# Patient Record
Sex: Female | Born: 1940
Health system: Southern US, Community
[De-identification: ages and names within clinical notes are randomized; demographics above are authoritative.]

## PROBLEM LIST (undated history)

## (undated) DIAGNOSIS — F32A Depression, unspecified: Secondary | ICD-10-CM

## (undated) DIAGNOSIS — I10 Essential (primary) hypertension: Secondary | ICD-10-CM

## (undated) DIAGNOSIS — F329 Major depressive disorder, single episode, unspecified: Secondary | ICD-10-CM

## (undated) DIAGNOSIS — E785 Hyperlipidemia, unspecified: Secondary | ICD-10-CM

## (undated) DIAGNOSIS — T7840XA Allergy, unspecified, initial encounter: Secondary | ICD-10-CM

## (undated) DIAGNOSIS — C50911 Malignant neoplasm of unspecified site of right female breast: Secondary | ICD-10-CM

## (undated) DIAGNOSIS — E559 Vitamin D deficiency, unspecified: Secondary | ICD-10-CM

## (undated) HISTORY — PX: BREAST LUMPECTOMY: SHX2

## (undated) HISTORY — DX: Major depressive disorder, single episode, unspecified: F32.9

## (undated) HISTORY — PX: TONSILLECTOMY AND ADENOIDECTOMY: SUR1326

## (undated) HISTORY — PX: ABDOMINAL HYSTERECTOMY: SHX81

## (undated) HISTORY — DX: Allergy, unspecified, initial encounter: T78.40XA

## (undated) HISTORY — DX: Hyperlipidemia, unspecified: E78.5

## (undated) HISTORY — DX: Essential (primary) hypertension: I10

## (undated) HISTORY — DX: Malignant neoplasm of unspecified site of right female breast: C50.911

## (undated) HISTORY — DX: Depression, unspecified: F32.A

## (undated) HISTORY — DX: Vitamin D deficiency, unspecified: E55.9

---

## 1990-06-28 DIAGNOSIS — C50911 Malignant neoplasm of unspecified site of right female breast: Secondary | ICD-10-CM

## 1990-06-28 HISTORY — DX: Malignant neoplasm of unspecified site of right female breast: C50.911

## 1998-03-05 ENCOUNTER — Other Ambulatory Visit: Admission: RE | Admit: 1998-03-05 | Discharge: 1998-03-05 | Payer: Self-pay | Admitting: Oncology

## 1999-03-06 ENCOUNTER — Other Ambulatory Visit: Admission: RE | Admit: 1999-03-06 | Discharge: 1999-03-06 | Payer: Self-pay | Admitting: Oncology

## 1999-07-17 ENCOUNTER — Encounter: Admission: RE | Admit: 1999-07-17 | Discharge: 1999-07-17 | Payer: Self-pay | Admitting: Oncology

## 1999-07-17 ENCOUNTER — Encounter: Payer: Self-pay | Admitting: Oncology

## 2000-04-22 ENCOUNTER — Encounter: Admission: RE | Admit: 2000-04-22 | Discharge: 2000-04-22 | Payer: Self-pay | Admitting: Family Medicine

## 2000-04-22 ENCOUNTER — Encounter: Payer: Self-pay | Admitting: Family Medicine

## 2000-08-01 ENCOUNTER — Encounter: Payer: Self-pay | Admitting: Oncology

## 2000-08-01 ENCOUNTER — Encounter: Admission: RE | Admit: 2000-08-01 | Discharge: 2000-08-01 | Payer: Self-pay | Admitting: Oncology

## 2001-07-13 ENCOUNTER — Other Ambulatory Visit: Admission: RE | Admit: 2001-07-13 | Discharge: 2001-07-13 | Payer: Self-pay | Admitting: *Deleted

## 2001-08-03 ENCOUNTER — Encounter: Payer: Self-pay | Admitting: Oncology

## 2001-08-03 ENCOUNTER — Encounter: Admission: RE | Admit: 2001-08-03 | Discharge: 2001-08-03 | Payer: Self-pay | Admitting: Oncology

## 2002-06-11 ENCOUNTER — Encounter: Admission: RE | Admit: 2002-06-11 | Discharge: 2002-06-11 | Payer: Self-pay | Admitting: Family Medicine

## 2002-06-11 ENCOUNTER — Emergency Department (HOSPITAL_COMMUNITY): Admission: EM | Admit: 2002-06-11 | Discharge: 2002-06-12 | Payer: Self-pay | Admitting: Emergency Medicine

## 2002-06-11 ENCOUNTER — Encounter: Payer: Self-pay | Admitting: Family Medicine

## 2002-08-14 ENCOUNTER — Encounter: Admission: RE | Admit: 2002-08-14 | Discharge: 2002-08-14 | Payer: Self-pay | Admitting: Oncology

## 2002-08-14 ENCOUNTER — Encounter: Payer: Self-pay | Admitting: Oncology

## 2003-10-17 ENCOUNTER — Encounter: Admission: RE | Admit: 2003-10-17 | Discharge: 2003-10-17 | Payer: Self-pay | Admitting: Oncology

## 2004-11-12 ENCOUNTER — Encounter: Admission: RE | Admit: 2004-11-12 | Discharge: 2004-11-12 | Payer: Self-pay | Admitting: Internal Medicine

## 2005-11-15 ENCOUNTER — Encounter: Admission: RE | Admit: 2005-11-15 | Discharge: 2005-11-15 | Payer: Self-pay | Admitting: Internal Medicine

## 2006-04-25 ENCOUNTER — Other Ambulatory Visit: Admission: RE | Admit: 2006-04-25 | Discharge: 2006-04-25 | Payer: Self-pay | Admitting: Internal Medicine

## 2007-06-02 ENCOUNTER — Encounter: Admission: RE | Admit: 2007-06-02 | Discharge: 2007-06-02 | Payer: Self-pay | Admitting: Internal Medicine

## 2008-06-05 ENCOUNTER — Other Ambulatory Visit: Admission: RE | Admit: 2008-06-05 | Discharge: 2008-06-05 | Payer: Self-pay | Admitting: Internal Medicine

## 2008-06-11 ENCOUNTER — Encounter: Admission: RE | Admit: 2008-06-11 | Discharge: 2008-06-11 | Payer: Self-pay | Admitting: Internal Medicine

## 2008-07-01 ENCOUNTER — Ambulatory Visit: Payer: Self-pay | Admitting: Internal Medicine

## 2008-07-11 ENCOUNTER — Ambulatory Visit: Payer: Self-pay | Admitting: Internal Medicine

## 2008-07-11 ENCOUNTER — Encounter: Payer: Self-pay | Admitting: Internal Medicine

## 2008-07-16 ENCOUNTER — Encounter: Payer: Self-pay | Admitting: Internal Medicine

## 2010-06-03 ENCOUNTER — Encounter
Admission: RE | Admit: 2010-06-03 | Discharge: 2010-06-03 | Payer: Self-pay | Source: Home / Self Care | Admitting: Internal Medicine

## 2010-07-30 NOTE — Procedures (Signed)
Summary: Colonoscopy   Colonoscopy  Procedure date:  07/11/2008  Findings:      Location:  [facility].    Procedures Next Due Date:    Colonoscopy: 06/2013  COLONOSCOPY PROCEDURE REPORT  PATIENT:  Tiffany Vaughn, Tiffany Vaughn  MR#:  [id number] BIRTHDATE:   Oct 04, 1940   GENDER:   female  ENDOSCOPIST:   Wilhemina Bonito. Eda Keys, MD Referred by: Marisue Brooklyn, D.O.  PROCEDURE DATE:  07/11/2008 PROCEDURE:  Colonoscopy with snare polypectomy ASA CLASS:   Class II INDICATIONS: colorectal cancer screening, average risk   MEDICATIONS:    Versed 8 mg IV, Fentanyl 75 mcg IV  DESCRIPTION OF PROCEDURE:   After the risks benefits and alternatives of the procedure were thoroughly explained, informed consent was obtained.  Digital rectal exam was performed and revealed no abnormalities.   The LB CF-[id number] endoscope was introduced through the anus and advanced to the cecum, which was identified by both the appendix and ileocecal valve, without limitations. TIME TO CECUM = 4:07 MIN. The quality of the prep was good, using MoviPrep.  The instrument was then slowly withdrawn (TIME = 14:25 MIN) as the colon was fully examined. <<PROCEDUREIMAGES>>        <<OLD IMAGES>>  FINDINGS:  A 8mm pedunculated polyp was found in the ascending colon. Polyp was snared without cautery. Retrieval was successful.  This was otherwise a normal examination.   Retroflexed views in the rectum revealed no abnormalities.    The scope was then withdrawn from the patient and the procedure completed.  COMPLICATIONS:   None  July 16, 2008 MRN: [id number]  REPORT OF SURGICAL PATHOLOGY   Case #: [id number] Patient Name: Tiffany Vaughn, Tiffany Vaughn Office Chart Number:  [id number]   MRN: [id number] Pathologist: Beulah Gandy. Luisa Hart, MD DOB/Age  [birth year] (Age: 70)    Gender: F Date Taken:  07/11/2008 Date Received: 07/12/2008   FINAL DIAGNOSIS   ***MICROSCOPIC EXAMINATION AND DIAGNOSIS***   COLON, ASCENDING POLYP:  TUBULAR  ADENOMA.  NO HIGH GRADE DYSPLASIA OR MALIGNANCY IDENTIFIED.   kv Date Reported:  07/15/2008     Beulah Gandy. Luisa Hart, MD    [facility] [address] [place], [place]  [address]    Dear Ms. Aziz,  I am pleased to inform you that the colon polyp(s) removed during your recent colonoscopy was (were) found to be benign (no cancer detected) upon pathologic examination.  I recommend you have a repeat colonoscopy examination in 5 years to look for recurrent polyps, as having colon polyps increases your risk for having recurrent polyps or even colon cancer in the future.  Should you develop new or worsening symptoms of abdominal pain, bowel habit changes or bleeding from the rectum or bowels, please schedule an evaluation with either your primary care physician or with me.  Additional information/recommendations:  _X._ No further action with gastroenterology is needed at this time. Please      follow-up with your primary care physician for your other healthcare      needs.  Please call us if you are having persistent problems or have questions about your condition that have not been fully answered at this time.  Sincerely,  Hilarie Fredrickson MD  ENDOSCOPIC IMPRESSION:  1) Pedunculated polyp in the ascending colon - removed  2) Otherwise normal examination  RECOMMENDATIONS:  1) Repeat colonoscopy in 5 years if polyp adenomatous; otherwise 10 years    _______________________________ Wilhemina Bonito. Eda Keys, MD  CC: Linton Rump Elisabeth Most DO;  The Patient  This report was created from the original endoscopy report, which was reviewed and signed by the above listed endoscopist.

## 2010-07-30 NOTE — Letter (Signed)
Summary: Patient Notice- Polyp Results  [facility]  [address] [place], [place] [address]   Phone: [phone]  Fax: [phone]        July 16, 2008 MRN: [id number]    [facility] [address] [place], [place]  [address]    Dear Ms. Klare,  I am pleased to inform you that the colon polyp(s) removed during your recent colonoscopy was (were) found to be benign (no cancer detected) upon pathologic examination.  I recommend you have a repeat colonoscopy examination in 5 years to look for recurrent polyps, as having colon polyps increases your risk for having recurrent polyps or even colon cancer in the future.  Should you develop new or worsening symptoms of abdominal pain, bowel habit changes or bleeding from the rectum or bowels, please schedule an evaluation with either your primary care physician or with me.  Additional information/recommendations:  _X._ No further action with gastroenterology is needed at this time. Please      follow-up with your primary care physician for your other healthcare      needs.  Please call us if you are having persistent problems or have questions about your condition that have not been fully answered at this time.  Sincerely,  Hilarie Fredrickson MD  This letter has been electronically signed by your physician.

## 2010-07-30 NOTE — Miscellaneous (Signed)
Summary: [facility] pre-visit  Clinical Lists Changes  Medications: Added new medication of MOVIPREP 100 GM  SOLR (PEG-KCL-NACL-NASULF-NA ASC-C) As per prep instructions. - Signed Rx of MOVIPREP 100 GM  SOLR (PEG-KCL-NACL-NASULF-NA ASC-C) As per prep instructions.;  #1 x 0;  Signed;  Entered by: Quincy Carnes RN;  Authorized by: Hilarie Fredrickson MD;  Method used: Electronically to [facility] # [phone]*, [address] [place], [place]  [address], Ph: [phone], Fax: [phone]    Prescriptions: MOVIPREP 100 GM  SOLR (PEG-KCL-NACL-NASULF-NA ASC-C) As per prep instructions.  #1 x 0   Entered by:   Quincy Carnes RN   Authorized by:   Hilarie Fredrickson MD   Signed by:   Quincy Carnes RN on 07/01/2008   Method used:   Electronically to        [facility] # [phone]* (retail)       [address]       [place], [place]  [address]       Ph: [phone]       Fax: [phone]   RxID:   [id number]

## 2010-11-24 ENCOUNTER — Other Ambulatory Visit: Payer: Self-pay | Admitting: Internal Medicine

## 2010-11-24 DIAGNOSIS — Z780 Asymptomatic menopausal state: Secondary | ICD-10-CM

## 2010-12-02 ENCOUNTER — Other Ambulatory Visit: Payer: Self-pay

## 2011-06-15 ENCOUNTER — Other Ambulatory Visit: Payer: Self-pay | Admitting: Internal Medicine

## 2011-06-15 DIAGNOSIS — Z1231 Encounter for screening mammogram for malignant neoplasm of breast: Secondary | ICD-10-CM

## 2011-07-27 ENCOUNTER — Ambulatory Visit
Admission: RE | Admit: 2011-07-27 | Discharge: 2011-07-27 | Disposition: A | Discharge status: Home / Self Care | Payer: Medicare Other | Source: Ambulatory Visit | Attending: Internal Medicine | Admitting: Internal Medicine

## 2011-07-27 DIAGNOSIS — Z1231 Encounter for screening mammogram for malignant neoplasm of breast: Secondary | ICD-10-CM

## 2011-12-08 ENCOUNTER — Other Ambulatory Visit: Payer: Self-pay | Admitting: Internal Medicine

## 2011-12-08 DIAGNOSIS — Z853 Personal history of malignant neoplasm of breast: Secondary | ICD-10-CM

## 2011-12-08 DIAGNOSIS — Z780 Asymptomatic menopausal state: Secondary | ICD-10-CM

## 2011-12-08 DIAGNOSIS — Z1231 Encounter for screening mammogram for malignant neoplasm of breast: Secondary | ICD-10-CM

## 2011-12-08 DIAGNOSIS — M8580 Other specified disorders of bone density and structure, unspecified site: Secondary | ICD-10-CM

## 2011-12-08 DIAGNOSIS — N63 Unspecified lump in unspecified breast: Secondary | ICD-10-CM

## 2011-12-14 ENCOUNTER — Ambulatory Visit
Admission: RE | Admit: 2011-12-14 | Discharge: 2011-12-14 | Disposition: A | Discharge status: Home / Self Care | Payer: Medicare Other | Source: Ambulatory Visit | Attending: Internal Medicine | Admitting: Internal Medicine

## 2011-12-14 DIAGNOSIS — Z853 Personal history of malignant neoplasm of breast: Secondary | ICD-10-CM

## 2011-12-14 DIAGNOSIS — N63 Unspecified lump in unspecified breast: Secondary | ICD-10-CM

## 2011-12-20 ENCOUNTER — Other Ambulatory Visit: Payer: Medicare Other

## 2012-12-07 ENCOUNTER — Other Ambulatory Visit: Payer: Self-pay | Admitting: Internal Medicine

## 2012-12-07 DIAGNOSIS — Z853 Personal history of malignant neoplasm of breast: Secondary | ICD-10-CM

## 2012-12-07 DIAGNOSIS — Z1231 Encounter for screening mammogram for malignant neoplasm of breast: Secondary | ICD-10-CM

## 2012-12-07 DIAGNOSIS — Z9889 Other specified postprocedural states: Secondary | ICD-10-CM

## 2013-01-19 ENCOUNTER — Ambulatory Visit: Payer: Medicare Other

## 2013-01-19 ENCOUNTER — Other Ambulatory Visit: Payer: Medicare Other

## 2013-07-11 ENCOUNTER — Encounter: Payer: Self-pay | Admitting: Internal Medicine

## 2013-07-25 ENCOUNTER — Ambulatory Visit
Admission: RE | Admit: 2013-07-25 | Discharge: 2013-07-25 | Disposition: A | Discharge status: Home / Self Care | Payer: Medicare HMO | Source: Ambulatory Visit | Attending: Internal Medicine | Admitting: Internal Medicine

## 2013-07-25 DIAGNOSIS — Z853 Personal history of malignant neoplasm of breast: Secondary | ICD-10-CM

## 2013-07-25 DIAGNOSIS — Z9889 Other specified postprocedural states: Secondary | ICD-10-CM

## 2013-07-25 DIAGNOSIS — Z1231 Encounter for screening mammogram for malignant neoplasm of breast: Secondary | ICD-10-CM

## 2013-09-25 ENCOUNTER — Telehealth: Payer: Self-pay | Admitting: *Deleted

## 2013-09-25 NOTE — Telephone Encounter (Signed)
PT.'S BREAST CANCER WAS IN HER RIGHT BREAST. THE RIGHT BREAST IS TWICE AS SMALL AS THE LEFT BREAST. OCCASIONALLY THE RIGHT BREAST HAS AN "ACHY FEELING BEHIND THE BREAST". SHE QUESTIONS "IS THIS NORMAL"? PT. HAD A MM DIGITAL SCREENING BILATERAL MAMMOGRAM WITH CAD ON 07/25/13 WHICH SHOWED NO EVIDENCE OF MALIGNANCY. NOTIFIED DR.MAGRINAT OF PT.'S CONCERNS AND HE WAS SHOWN THE PT.'S 07/25/13 MAMMOGRAM REPORT. VERBAL ORDER AND READ BACK TO DR.MAGRINAT- THE SMALLER RIGHT BREAST IS A RESULT OF THE RADIATION PT. RECEIVED. AFTER REVIEWING PT. MAMMOGRAM HE HAS NO CONCERNS BUT IF THE PT. WOULD LIKE TO BE SEEN, IT WOULD BE 12/11/13. NOTIFIED PT. OF THE ABOVE INFORMATION. SHE WAS RELIEVED AND DID NOT FEEL SHE NEEDED TO SEE DR.MAGRINAT. ENCOURAGED PT. TO KEEP HER PRIMARY CARE PHYSICIAN UPDATED. PT.VOICES UNDERSTANDING.

## 2013-10-21 DIAGNOSIS — Z853 Personal history of malignant neoplasm of breast: Secondary | ICD-10-CM | POA: Insufficient documentation

## 2013-10-21 DIAGNOSIS — I10 Essential (primary) hypertension: Secondary | ICD-10-CM | POA: Insufficient documentation

## 2013-10-21 DIAGNOSIS — E785 Hyperlipidemia, unspecified: Secondary | ICD-10-CM | POA: Insufficient documentation

## 2013-10-21 DIAGNOSIS — J302 Other seasonal allergic rhinitis: Secondary | ICD-10-CM | POA: Insufficient documentation

## 2013-10-21 DIAGNOSIS — F325 Major depressive disorder, single episode, in full remission: Secondary | ICD-10-CM | POA: Insufficient documentation

## 2013-10-21 DIAGNOSIS — E559 Vitamin D deficiency, unspecified: Secondary | ICD-10-CM | POA: Insufficient documentation

## 2013-10-24 ENCOUNTER — Encounter: Payer: Self-pay | Admitting: Internal Medicine

## 2013-10-24 ENCOUNTER — Ambulatory Visit (INDEPENDENT_AMBULATORY_CARE_PROVIDER_SITE_OTHER): Payer: Medicare HMO | Admitting: Internal Medicine

## 2013-10-24 ENCOUNTER — Other Ambulatory Visit: Payer: Self-pay | Admitting: Internal Medicine

## 2013-10-24 VITALS — BP 126/76 | HR 80 | Temp 98.1°F | Resp 18 | Ht 65.0 in | Wt 138.0 lb

## 2013-10-24 DIAGNOSIS — Z79899 Other long term (current) drug therapy: Secondary | ICD-10-CM | POA: Insufficient documentation

## 2013-10-24 DIAGNOSIS — N6310 Unspecified lump in the right breast, unspecified quadrant: Principal | ICD-10-CM

## 2013-10-24 DIAGNOSIS — N6315 Unspecified lump in the right breast, overlapping quadrants: Secondary | ICD-10-CM

## 2013-10-24 DIAGNOSIS — N63 Unspecified lump in unspecified breast: Secondary | ICD-10-CM

## 2013-10-24 NOTE — Progress Notes (Signed)
   Subjective:    Patient ID: Tiffany Vaughn, female    DOB: 11-07-1940, 73 y.o.   MRN: [id number]  HPI Patient has a Hx/o of a rt breast lumpectomy 23 years ago and was Tx'd with External radiation and then Tamoxifen for 5 years and has been on Evista since. Now she reports a tender lump around the previous lumpectomy site.  Medication Sig  . ASPIRIN PO Take 81 mg by mouth daily.  Marland Kitchen atorvastatin (LIPITOR) 20 MG tablet Take 20 mg by mouth daily.  . Cholecalciferol (VITAMIN D PO) Take by mouth daily.  Marland Kitchen MAGNESIUM PO Take by mouth daily.  . metoprolol succinate (TOPROL-XL) 50 MG  Take 50 mg by mouth daily. Take with or immediately following a meal.  . Omega-3 Fatty Acids (FISH OIL PO) Take by mouth daily.  . raloxifene (EVISTA) 60 MG tablet Take 60 mg by mouth daily.    Allergies  Allergen Reactions  . Codeine    Past Medical History  Diagnosis Date  . Hyperlipidemia   . Hypertension   . Vitamin D deficiency   . Depression   . Allergy   . Cancer of right breast 1992   Review of Systems Systems review - negative to above    Objective:   Physical Exam  Breast focused exam finds Left Breast & axilla clear w/o suspect areas. Right axilla is likewise clear but there is a tender mobile thickening in the area of the previous Bx site (12 o'clock)  Assessment & Plan:   1. Breast lump on right side at 12 o'clock position  - MM Digital Diagnostic Unilat R; Future

## 2013-10-25 ENCOUNTER — Other Ambulatory Visit: Payer: Self-pay | Admitting: Internal Medicine

## 2013-10-25 ENCOUNTER — Ambulatory Visit
Admission: RE | Admit: 2013-10-25 | Discharge: 2013-10-25 | Disposition: A | Discharge status: Home / Self Care | Payer: Medicare Other | Source: Ambulatory Visit | Attending: Internal Medicine | Admitting: Internal Medicine

## 2013-10-25 ENCOUNTER — Ambulatory Visit
Admission: RE | Admit: 2013-10-25 | Discharge: 2013-10-25 | Disposition: A | Discharge status: Home / Self Care | Payer: Medicare HMO | Source: Ambulatory Visit | Attending: Internal Medicine | Admitting: Internal Medicine

## 2013-10-25 DIAGNOSIS — N6310 Unspecified lump in the right breast, unspecified quadrant: Principal | ICD-10-CM

## 2013-10-25 DIAGNOSIS — N6315 Unspecified lump in the right breast, overlapping quadrants: Secondary | ICD-10-CM

## 2013-11-26 ENCOUNTER — Ambulatory Visit
Admission: RE | Admit: 2013-11-26 | Discharge: 2013-11-26 | Disposition: A | Discharge status: Home / Self Care | Payer: Medicare HMO | Source: Ambulatory Visit | Attending: Internal Medicine | Admitting: Internal Medicine

## 2013-11-26 ENCOUNTER — Other Ambulatory Visit: Payer: Self-pay | Admitting: Internal Medicine

## 2013-11-26 DIAGNOSIS — N6310 Unspecified lump in the right breast, unspecified quadrant: Secondary | ICD-10-CM

## 2013-12-04 ENCOUNTER — Encounter: Payer: Self-pay | Admitting: Internal Medicine

## 2013-12-04 ENCOUNTER — Encounter: Payer: Self-pay | Admitting: Physician Assistant

## 2013-12-10 ENCOUNTER — Other Ambulatory Visit: Payer: Self-pay

## 2013-12-10 ENCOUNTER — Other Ambulatory Visit: Payer: Medicare HMO

## 2013-12-10 DIAGNOSIS — I10 Essential (primary) hypertension: Secondary | ICD-10-CM

## 2013-12-10 DIAGNOSIS — E559 Vitamin D deficiency, unspecified: Secondary | ICD-10-CM

## 2013-12-10 DIAGNOSIS — E785 Hyperlipidemia, unspecified: Secondary | ICD-10-CM

## 2013-12-10 DIAGNOSIS — Z79899 Other long term (current) drug therapy: Secondary | ICD-10-CM

## 2013-12-10 LAB — CBC WITH DIFFERENTIAL/PLATELET
Basophils Absolute: 0.1 10*3/uL (ref 0.0–0.1)
Basophils Relative: 1 % (ref 0–1)
Eosinophils Absolute: 0.2 10*3/uL (ref 0.0–0.7)
Eosinophils Relative: 3 % (ref 0–5)
HCT: 40 % (ref 36.0–46.0)
Hemoglobin: 13.7 g/dL (ref 12.0–15.0)
Lymphocytes Relative: 30 % (ref 12–46)
Lymphs Abs: 2 10*3/uL (ref 0.7–4.0)
MCH: 28.8 pg (ref 26.0–34.0)
MCHC: 34.3 g/dL (ref 30.0–36.0)
MCV: 84.2 fL (ref 78.0–100.0)
Monocytes Absolute: 0.5 10*3/uL (ref 0.1–1.0)
Monocytes Relative: 8 % (ref 3–12)
Neutro Abs: 3.8 10*3/uL (ref 1.7–7.7)
Neutrophils Relative %: 58 % (ref 43–77)
Platelets: 269 10*3/uL (ref 150–400)
RBC: 4.75 MIL/uL (ref 3.87–5.11)
RDW: 14 % (ref 11.5–15.5)
WBC: 6.6 10*3/uL (ref 4.0–10.5)

## 2013-12-10 LAB — BASIC METABOLIC PANEL WITH GFR
BUN: 12 mg/dL (ref 6–23)
CO2: 27 meq/L (ref 19–32)
Calcium: 9.3 mg/dL (ref 8.4–10.5)
Chloride: 107 meq/L (ref 96–112)
Creat: 0.91 mg/dL (ref 0.50–1.10)
GFR, Est African American: 72 mL/min
GFR, Est Non African American: 62 mL/min
Glucose, Bld: 99 mg/dL (ref 70–99)
Potassium: 4.5 mEq/L (ref 3.5–5.3)
Sodium: 141 mEq/L (ref 135–145)

## 2013-12-10 LAB — HEPATIC FUNCTION PANEL
ALT: 15 U/L (ref 0–35)
AST: 23 U/L (ref 0–37)
Albumin: 4.3 g/dL (ref 3.5–5.2)
Alkaline Phosphatase: 85 U/L (ref 39–117)
Bilirubin, Direct: 0.1 mg/dL (ref 0.0–0.3)
Indirect Bilirubin: 0.3 mg/dL (ref 0.2–1.2)
Total Bilirubin: 0.4 mg/dL (ref 0.2–1.2)
Total Protein: 6.7 g/dL (ref 6.0–8.3)

## 2013-12-10 LAB — LIPID PANEL
Cholesterol: 191 mg/dL (ref 0–200)
HDL: 50 mg/dL (ref >39)
LDL Cholesterol: 114 mg/dL — ABNORMAL HIGH (ref 0–99)
Total CHOL/HDL Ratio: 3.8 Ratio
Triglycerides: 137 mg/dL (ref <150)
VLDL: 27 mg/dL (ref 0–40)

## 2013-12-10 LAB — MAGNESIUM: Magnesium: 2 mg/dL (ref 1.5–2.5)

## 2013-12-11 LAB — URINALYSIS, MICROSCOPIC ONLY
Bacteria, UA: NONE SEEN
Casts: NONE SEEN
Crystals: NONE SEEN
Squamous Epithelial / HPF: NONE SEEN

## 2013-12-11 LAB — URINALYSIS, ROUTINE W REFLEX MICROSCOPIC
Bilirubin Urine: NEGATIVE
Glucose, UA: NEGATIVE mg/dL
Hgb urine dipstick: NEGATIVE
Ketones, ur: NEGATIVE mg/dL
Nitrite: NEGATIVE
Protein, ur: NEGATIVE mg/dL
Specific Gravity, Urine: 1.017 (ref 1.005–1.030)
Urobilinogen, UA: 0.2 mg/dL (ref 0.0–1.0)
pH: 5 (ref 5.0–8.0)

## 2013-12-11 LAB — TSH: TSH: 2.135 u[IU]/mL (ref 0.350–4.500)

## 2013-12-11 LAB — MICROALBUMIN / CREATININE URINE RATIO
Creatinine, Urine: 135.6 mg/dL
Microalb Creat Ratio: 3.7 mg/g (ref 0.0–30.0)
Microalb, Ur: 0.5 mg/dL (ref 0.00–1.89)

## 2013-12-17 ENCOUNTER — Ambulatory Visit (INDEPENDENT_AMBULATORY_CARE_PROVIDER_SITE_OTHER): Payer: Medicare HMO | Admitting: Physician Assistant

## 2013-12-17 ENCOUNTER — Encounter: Payer: Self-pay | Admitting: Physician Assistant

## 2013-12-17 VITALS — BP 120/72 | HR 76 | Temp 98.1°F | Resp 16 | Ht 65.0 in | Wt 138.0 lb

## 2013-12-17 DIAGNOSIS — Z23 Encounter for immunization: Secondary | ICD-10-CM

## 2013-12-17 DIAGNOSIS — E2839 Other primary ovarian failure: Secondary | ICD-10-CM

## 2013-12-17 DIAGNOSIS — Z789 Other specified health status: Secondary | ICD-10-CM

## 2013-12-17 DIAGNOSIS — F329 Major depressive disorder, single episode, unspecified: Secondary | ICD-10-CM

## 2013-12-17 DIAGNOSIS — E785 Hyperlipidemia, unspecified: Secondary | ICD-10-CM

## 2013-12-17 DIAGNOSIS — I10 Essential (primary) hypertension: Secondary | ICD-10-CM

## 2013-12-17 DIAGNOSIS — Z79899 Other long term (current) drug therapy: Secondary | ICD-10-CM

## 2013-12-17 DIAGNOSIS — C50911 Malignant neoplasm of unspecified site of right female breast: Secondary | ICD-10-CM

## 2013-12-17 DIAGNOSIS — Z0000 Encounter for general adult medical examination without abnormal findings: Secondary | ICD-10-CM

## 2013-12-17 DIAGNOSIS — E559 Vitamin D deficiency, unspecified: Secondary | ICD-10-CM

## 2013-12-17 DIAGNOSIS — F32A Depression, unspecified: Secondary | ICD-10-CM

## 2013-12-17 NOTE — Patient Instructions (Signed)
   Benefiber is good for constipation/diarrhea/irritable bowel syndrome, it helps with weight loss and can help lower your bad cholesterol. Please do 1-2 TBSP in the morning in water, coffee, or tea. It can take up to a month before you can see a difference with your bowel movements. It is cheapest from [employer], [employer], [employer].    

## 2013-12-17 NOTE — Progress Notes (Signed)
MEDICARE ANNUAL WELLNESS VISIT AND CPE  Assessment:   1. Essential hypertension - EKG 12-Lead - [place], RETROPERITNL ABD,  LTD  2. Hyperlipidemia  3. Vitamin D deficiency  4. PREVNAR 13  5. Depression remission  6. Cancer of right breast Continue close follow ups  7. Estrogen deficiency - DG Bone Density; Future  LABS WERE DONE 1  WEEK PRIOR TO APPOINTMENT AND WERE ALL NORMAL  Plan:   During the course of the visit the patient was educated and counseled about appropriate screening and preventive services including:    Pneumococcal vaccine   Influenza vaccine  Td vaccine  Screening electrocardiogram  Screening mammography  Bone densitometry screening  Colorectal cancer screening  Diabetes screening  Glaucoma screening  Nutrition counseling   Advanced directives: given information/requested  Screening recommendations, referrals:  Vaccinations: Tdap vaccine not indicated Influenza vaccine not indicated Pneumococcal vaccine not indicated Shingles vaccine not indicated Hep B vaccine not indicated  Nutrition assessed and recommended  Colonoscopy DUE this year will call Dr. Henrene Pastor Mammogram due 1 year Pap smear not indicated Pelvic exam not indicated Recommended yearly ophthalmology/optometry visit for glaucoma screening and checkup Recommended yearly dental visit for hygiene and checkup Advanced directives - requested  Conditions/risks identified: BMI: Discussed weight loss, diet, and increase physical activity.  Increase physical activity: AHA recommends 150 minutes of physical activity a week.  Medications reviewed DEXA- ordered Urinary Incontinence is not an issue: discussed non pharmacology and pharmacology options.  Fall risk: low- discussed PT, home fall assessment, medications.   Subjective:   Tiffany Vaughn is a 73 y.o. female who presents for Medicare Annual Wellness Visit and complete physical.    Date of last medicare wellness visit  is unknown.  Her blood pressure has been controlled at home, today their BP is BP: 120/72 mmHg She does not workout but she goes to [place] often and walks when she is there. She denies chest pain, shortness of breath, dizziness.  She is on cholesterol medication and denies myalgias. Her cholesterol is at goal. The cholesterol last visit was:   Lab Results  Component Value Date   CHOL 191 12/10/2013   HDL 50 12/10/2013   LDLCALC 114* 12/10/2013   TRIG 137 12/10/2013   CHOLHDL 3.8 12/10/2013   Last A1C in the office was:  Patient is on Vitamin D supplement.   She is on Evista for history of previous right breast cancer.    Names of Other Physician/Practitioners you currently use: 1. [facility]- here for primary care 2. Dr. Almyra Deforest, eye doctor, last visit seeing him Thursday 3. Dr. Orlando Penner, dentist, last visit q 6 months Patient Care Team: Unk Pinto, MD as PCP - General (Internal Medicine) Irene Shipper, MD as Consulting Physician (Gastroenterology) Janit Pagan, MD as Referring Physician (Cardiology)   Medication Review Current Outpatient Prescriptions on File Prior to Visit  Medication Sig Dispense Refill  . ASPIRIN PO Take 81 mg by mouth daily.      Marland Kitchen atorvastatin (LIPITOR) 20 MG tablet Take 20 mg by mouth daily.      . Cholecalciferol (VITAMIN D PO) Take by mouth daily.      Marland Kitchen MAGNESIUM PO Take by mouth daily.      . metoprolol succinate (TOPROL-XL) 50 MG 24 hr tablet Take 50 mg by mouth daily. Take with or immediately following a meal.      . Omega-3 Fatty Acids (FISH OIL PO) Take by mouth daily.      Marland Kitchen  raloxifene (EVISTA) 60 MG tablet Take 60 mg by mouth daily.       No current facility-administered medications on file prior to visit.    Current Problems (verified) Patient Active Problem List   Diagnosis Date Noted  . Encounter for long-term (current) use of other medications 10/24/2013  . Hyperlipidemia   . Hypertension   .  Vitamin D deficiency   . Depression   . Allergy   . Cancer of right breast     Screening Tests Health Maintenance  Topic Date Due  . Influenza Vaccine  01/26/2014  . Tetanus/tdap  04/16/2015  . Mammogram  11/27/2015  . Colonoscopy  07/11/2018  . Pneumococcal Polysaccharide Vaccine Age 75 And Over  Completed  . Zostavax  Completed    Immunization History  Administered Date(s) Administered  . Pneumococcal Polysaccharide-23 10/23/2009  . Td 04/15/2005  . Zoster 11/19/2010    Preventative care: Last colonoscopy: 2010 due THIS YEAR Dr. Henrene Pastor Last mammogram: 11/2013 Last pap smear/pelvic exam: 2009 remote DEXA: ? Cardiolite 2011 negative EF 65%   Prior vaccinations: TD or Tdap: 2006 NEXT YEAR  Influenza: 2014 at drug store  Pneumococcal: 2011 Shingles/Zostavax: 2012  History reviewed: allergies, current medications, past family history, past medical history, past social history, past surgical history and problem list  Risk Factors: Osteoporosis: postmenopausal estrogen deficiency and dietary calcium and/or vitamin D deficiency History of fracture in the past year: no  Tobacco History  Substance Use Topics  . Smoking status: Never Smoker   . Smokeless tobacco: Not on file  . Alcohol Use: No   She does not smoke.  Patient is not a former smoker. Are there smokers in your home (other than you)?  No  Alcohol Current alcohol use: none  Caffeine Current caffeine use: coffee 2 /day  Exercise  Current exercise: walking  Nutrition/Diet Current diet: in general, a "healthy" diet    Cardiac risk factors: advanced age (older than 41 for men, 62 for women), dyslipidemia and hypertension.  Depression Screen (Note: if answer to either of the following is "Yes", a more complete depression screening is indicated)   Q1: Over the past two weeks, have you felt down, depressed or hopeless? No  Q2: Over the past two weeks, have you felt little interest or pleasure in doing  things? No  Have you lost interest or pleasure in daily life? No  Do you often feel hopeless? No  Do you cry easily over simple problems? No  Activities of Daily Living In your present state of health, do you have any difficulty performing the following activities?:  Driving? No Managing money?  No Feeding yourself? No Getting from bed to chair? No Climbing a flight of stairs? No Preparing food and eating?: No Bathing or showering? No Getting dressed: No Getting to the toilet? No Using the toilet:No Moving around from place to place: No In the past year have you fallen or had a near fall?:No   Are you sexually active?  No  Do you have more than one partner?  No  Vision Difficulties: No  Hearing Difficulties: No Do you often ask people to speak up or repeat themselves? No Do you experience ringing or noises in your ears? No Do you have difficulty understanding soft or whispered voices? No  Cognition  Do you feel that you have a problem with memory?No  Do you often misplace items? No  Do you feel safe at home?  Yes  Advanced directives Does patient have a  Health Care Power of Attorney? Yes Does patient have a Living Will? Yes   Objective:     Blood pressure 120/72, pulse 76, temperature 98.1 F (36.7 C), resp. rate 16, height 5\' 5"  (1.651 m), weight 138 lb (62.596 kg). Body mass index is 22.96 kg/(m^2).  General appearance: alert, no distress, WD/WN,  female Cognitive Testing  Alert? Yes  Normal Appearance?Yes  Oriented to person? Yes  Place? Yes   Time? Yes  Recall of three objects?  Yes  Can perform simple calculations? Yes  Displays appropriate judgment?Yes  Can read the correct time from a watch face?Yes  HEENT: normocephalic, sclerae anicteric, TMs pearly, nares patent, no discharge or erythema, pharynx normal Oral cavity: MMM, no lesions Neck: supple, no lymphadenopathy, no thyromegaly, no masses Heart: RRR, normal S1, S2, no murmurs Lungs: CTA  bilaterally, no wheezes, rhonchi, or rales Abdomen: +bs, soft, non tender, non distended, no masses, no hepatomegaly, no splenomegaly Musculoskeletal: nontender, no swelling, no obvious deformity Extremities: no edema, no cyanosis, no clubbing Pulses: 2+ symmetric, upper and lower extremities, normal cap refill Neurological: alert, oriented x 3, CN2-12 intact, strength normal upper extremities and lower extremities, sensation normal throughout, DTRs 2+ throughout, no cerebellar signs, gait normal Psychiatric: normal affect, behavior normal, pleasant  Breast: defer Gyn: defer  Rectal: defer  Medicare Attestation I have personally reviewed: The patient's medical and social history Their use of alcohol, tobacco or illicit drugs Their current medications and supplements The patient's functional ability including ADLs,fall risks, home safety risks, cognitive, and hearing and visual impairment Diet and physical activities Evidence for depression or mood disorders  The patient's weight, height, BMI, and visual acuity have been recorded in the chart.  I have made referrals, counseling, and provided education to the patient based on review of the above and I have provided the patient with a written personalized care plan for preventive services.     Vicie Mutters, PA-C   12/17/2013

## 2014-01-30 ENCOUNTER — Ambulatory Visit
Admission: RE | Admit: 2014-01-30 | Discharge: 2014-01-30 | Disposition: A | Discharge status: Home / Self Care | Payer: Medicare HMO | Source: Ambulatory Visit | Attending: Physician Assistant | Admitting: Physician Assistant

## 2014-01-30 DIAGNOSIS — E2839 Other primary ovarian failure: Secondary | ICD-10-CM

## 2014-04-11 ENCOUNTER — Other Ambulatory Visit: Payer: Self-pay | Admitting: Physician Assistant

## 2014-04-18 ENCOUNTER — Encounter: Payer: Self-pay | Admitting: Gastroenterology

## 2014-06-28 HISTORY — PX: CATARACT EXTRACTION, BILATERAL: SHX1313

## 2014-07-06 ENCOUNTER — Encounter: Payer: Self-pay | Admitting: *Deleted

## 2014-07-07 ENCOUNTER — Other Ambulatory Visit: Payer: Self-pay | Admitting: Internal Medicine

## 2014-08-08 ENCOUNTER — Other Ambulatory Visit: Payer: Self-pay | Admitting: *Deleted

## 2014-08-08 ENCOUNTER — Other Ambulatory Visit: Payer: Self-pay | Admitting: Internal Medicine

## 2014-08-08 MED ORDER — ATORVASTATIN CALCIUM 20 MG PO TABS: 20.0000 mg | ORAL_TABLET | Freq: Every day | ORAL | Status: DC

## 2014-08-08 MED ORDER — METOPROLOL SUCCINATE ER 50 MG PO TB24: 50.0000 mg | ORAL_TABLET | Freq: Every day | ORAL | Status: DC

## 2014-08-08 MED ORDER — RALOXIFENE HCL 60 MG PO TABS: 60.0000 mg | ORAL_TABLET | Freq: Every day | ORAL | Status: DC

## 2014-08-20 ENCOUNTER — Ambulatory Visit (INDEPENDENT_AMBULATORY_CARE_PROVIDER_SITE_OTHER): Payer: Medicare HMO | Admitting: Physician Assistant

## 2014-08-20 ENCOUNTER — Encounter: Payer: Self-pay | Admitting: Physician Assistant

## 2014-08-20 VITALS — BP 110/62 | HR 64 | Temp 97.9°F | Resp 16 | Ht 65.0 in | Wt 136.0 lb

## 2014-08-20 DIAGNOSIS — I10 Essential (primary) hypertension: Secondary | ICD-10-CM

## 2014-08-20 DIAGNOSIS — E785 Hyperlipidemia, unspecified: Secondary | ICD-10-CM

## 2014-08-20 DIAGNOSIS — C50911 Malignant neoplasm of unspecified site of right female breast: Secondary | ICD-10-CM

## 2014-08-20 DIAGNOSIS — E559 Vitamin D deficiency, unspecified: Secondary | ICD-10-CM

## 2014-08-20 LAB — LIPID PANEL
Cholesterol: 162 mg/dL (ref 0–200)
HDL: 43 mg/dL — ABNORMAL LOW (ref >=46)
LDL Cholesterol: 95 mg/dL (ref 0–99)
Total CHOL/HDL Ratio: 3.8 {ratio}
Triglycerides: 119 mg/dL (ref <150)
VLDL: 24 mg/dL (ref 0–40)

## 2014-08-20 LAB — HEPATIC FUNCTION PANEL
ALT: 20 U/L (ref 0–35)
AST: 22 U/L (ref 0–37)
Albumin: 4.4 g/dL (ref 3.5–5.2)
Alkaline Phosphatase: 75 U/L (ref 39–117)
Bilirubin, Direct: 0.1 mg/dL (ref 0.0–0.3)
Indirect Bilirubin: 0.5 mg/dL (ref 0.2–1.2)
Total Bilirubin: 0.6 mg/dL (ref 0.2–1.2)
Total Protein: 7.1 g/dL (ref 6.0–8.3)

## 2014-08-20 NOTE — Progress Notes (Signed)
Assessment and Plan:  Hypertension: Continue medication, monitor blood pressure at home. Continue DASH diet.  Reminder to go to the ER if any CP, SOB, nausea, dizziness, severe HA, changes vision/speech, left arm numbness and tingling, and jaw pain. Cholesterol: Continue diet and exercise. Check cholesterol.  Vitamin D Def- check level and continue medications.   Continue diet and meds as discussed. Further disposition pending results of labs. Future Appointments Date Time Provider [place]  12/23/2014 10:00 AM Vicie Mutters, PA-C [facility] None    HPI 74 y.o. female  presents for 3 month follow up with hypertension, hyperlipidemia, prediabetes and vitamin D.  Her blood pressure has been controlled at home, today their BP is BP: 110/62 mmHg  She does workout. She denies chest pain, shortness of breath, dizziness.  She is on cholesterol medication, lipitor 20mg  QD and denies myalgias. Her cholesterol is at goal. The cholesterol last visit was:   Lab Results  Component Value Date   CHOL 191 12/10/2013   HDL 50 12/10/2013   LDLCALC 114* 12/10/2013   TRIG 137 12/10/2013   CHOLHDL 3.8 12/10/2013  She has had a normal A1C She is on Vitamin D for D deficiency, 1 pill a day She had a colonoscopy 06/05/2014 with Dr. [place] She is on Evista for previous right breast cancer history.  DEXA 01/2014 showed osteopenia.   Current Medications:  Current Outpatient Prescriptions on File Prior to Visit  Medication Sig Dispense Refill  . ASPIRIN PO Take 81 mg by mouth daily.    Marland Kitchen atorvastatin (LIPITOR) 20 MG tablet Take 1 tablet (20 mg total) by mouth at bedtime. 90 tablet 0  . Cholecalciferol (VITAMIN D PO) Take by mouth daily.    Marland Kitchen MAGNESIUM PO Take by mouth daily.    . metoprolol succinate (TOPROL-XL) 50 MG 24 hr tablet Take 1 tablet (50 mg total) by mouth daily. Take with or immediately following a meal. 90 tablet 0  . Omega-3 Fatty Acids (FISH OIL PO) Take by mouth daily.    .  raloxifene (EVISTA) 60 MG tablet Take 1 tablet (60 mg total) by mouth daily. 90 tablet 0   No current facility-administered medications on file prior to visit.   Medical History:  Past Medical History  Diagnosis Date  . Hyperlipidemia   . Hypertension   . Vitamin D deficiency   . Depression   . Allergy   . Cancer of right breast 1992   Allergies:  Allergies  Allergen Reactions  . Codeine      Review of Systems:  Review of Systems  Constitutional: Negative.   HENT: Negative.   Eyes: Negative.   Respiratory: Negative.   Cardiovascular: Negative.   Gastrointestinal: Negative.   Genitourinary: Negative.   Musculoskeletal: Negative.   Skin: Negative.   Neurological: Negative.   Endo/Heme/Allergies: Negative.   Psychiatric/Behavioral: Negative.     Family history- Review and unchanged Social history- Review and unchanged Physical Exam: BP 110/62 mmHg  Pulse 64  Temp(Src) 97.9 F (36.6 C)  Resp 16  Ht 5\' 5"  (1.651 m)  Wt 136 lb (61.689 kg)  BMI 22.63 kg/m2 Wt Readings from Last 3 Encounters:  08/20/14 136 lb (61.689 kg)  12/17/13 138 lb (62.596 kg)  10/24/13 138 lb (62.596 kg)   General Appearance: Well nourished, in no apparent distress. Eyes: PERRLA, EOMs, conjunctiva no swelling or erythema Sinuses: No Frontal/maxillary tenderness ENT/Mouth: Ext aud canals clear, TMs without erythema, bulging. No erythema, swelling, or exudate on post pharynx.  Tonsils  not swollen or erythematous. Hearing normal.  Neck: Supple, thyroid normal.  Respiratory: Respiratory effort normal, BS equal bilaterally without rales, rhonchi, wheezing or stridor.  Cardio: RRR with no MRGs. Brisk peripheral pulses without edema.  Abdomen: Soft, + BS,  Non tender, no guarding, rebound, hernias, masses. Lymphatics: Non tender without lymphadenopathy.  Musculoskeletal: Full ROM, 5/5 strength, Normal gait Skin: Warm, dry without rashes, lesions, ecchymosis.  Neuro: Cranial nerves intact. Normal  muscle tone, no cerebellar symptoms. Psych: Awake and oriented X 3, normal affect, Insight and Judgment appropriate.    Vicie Mutters, PA-C 10:02 AM [facility]

## 2014-08-21 LAB — VITAMIN D 25 HYDROXY (VIT D DEFICIENCY, FRACTURES): Vit D, 25-Hydroxy: 39 ng/mL (ref 30–100)

## 2014-11-18 ENCOUNTER — Other Ambulatory Visit: Payer: Self-pay | Admitting: Physician Assistant

## 2014-12-23 ENCOUNTER — Ambulatory Visit (INDEPENDENT_AMBULATORY_CARE_PROVIDER_SITE_OTHER): Payer: Medicare HMO | Admitting: Physician Assistant

## 2014-12-23 ENCOUNTER — Encounter: Payer: Self-pay | Admitting: Physician Assistant

## 2014-12-23 VITALS — BP 120/72 | HR 60 | Temp 97.7°F | Resp 16 | Ht 65.0 in | Wt 133.0 lb

## 2014-12-23 DIAGNOSIS — R6889 Other general symptoms and signs: Secondary | ICD-10-CM

## 2014-12-23 DIAGNOSIS — Z789 Other specified health status: Secondary | ICD-10-CM

## 2014-12-23 DIAGNOSIS — H1012 Acute atopic conjunctivitis, left eye: Secondary | ICD-10-CM

## 2014-12-23 DIAGNOSIS — Z0000 Encounter for general adult medical examination without abnormal findings: Secondary | ICD-10-CM

## 2014-12-23 DIAGNOSIS — I10 Essential (primary) hypertension: Secondary | ICD-10-CM

## 2014-12-23 DIAGNOSIS — Z23 Encounter for immunization: Secondary | ICD-10-CM

## 2014-12-23 DIAGNOSIS — Z79899 Other long term (current) drug therapy: Secondary | ICD-10-CM

## 2014-12-23 DIAGNOSIS — E785 Hyperlipidemia, unspecified: Secondary | ICD-10-CM

## 2014-12-23 DIAGNOSIS — F329 Major depressive disorder, single episode, unspecified: Secondary | ICD-10-CM

## 2014-12-23 DIAGNOSIS — C50911 Malignant neoplasm of unspecified site of right female breast: Secondary | ICD-10-CM

## 2014-12-23 DIAGNOSIS — F32A Depression, unspecified: Secondary | ICD-10-CM

## 2014-12-23 DIAGNOSIS — M8580 Other specified disorders of bone density and structure, unspecified site: Secondary | ICD-10-CM | POA: Insufficient documentation

## 2014-12-23 DIAGNOSIS — Z6822 Body mass index (BMI) 22.0-22.9, adult: Secondary | ICD-10-CM

## 2014-12-23 DIAGNOSIS — Z0001 Encounter for general adult medical examination with abnormal findings: Secondary | ICD-10-CM

## 2014-12-23 DIAGNOSIS — E559 Vitamin D deficiency, unspecified: Secondary | ICD-10-CM

## 2014-12-23 DIAGNOSIS — T7840XD Allergy, unspecified, subsequent encounter: Secondary | ICD-10-CM

## 2014-12-23 LAB — LIPID PANEL
Cholesterol: 183 mg/dL (ref 0–200)
HDL: 46 mg/dL (ref >=46)
LDL Cholesterol: 101 mg/dL — ABNORMAL HIGH (ref 0–99)
Total CHOL/HDL Ratio: 4 Ratio
Triglycerides: 179 mg/dL — ABNORMAL HIGH (ref <150)
VLDL: 36 mg/dL (ref 0–40)

## 2014-12-23 LAB — CBC WITH DIFFERENTIAL/PLATELET
Basophils Absolute: 0 10*3/uL (ref 0.0–0.1)
Basophils Relative: 0 % (ref 0–1)
Eosinophils Absolute: 0.2 10*3/uL (ref 0.0–0.7)
Eosinophils Relative: 3 % (ref 0–5)
HCT: 41.5 % (ref 36.0–46.0)
Hemoglobin: 13.8 g/dL (ref 12.0–15.0)
Lymphocytes Relative: 31 % (ref 12–46)
Lymphs Abs: 2 10*3/uL (ref 0.7–4.0)
MCH: 28.9 pg (ref 26.0–34.0)
MCHC: 33.3 g/dL (ref 30.0–36.0)
MCV: 87 fL (ref 78.0–100.0)
MPV: 11.2 fL (ref 8.6–12.4)
Monocytes Absolute: 0.6 10*3/uL (ref 0.1–1.0)
Monocytes Relative: 9 % (ref 3–12)
Neutro Abs: 3.6 10*3/uL (ref 1.7–7.7)
Neutrophils Relative %: 57 % (ref 43–77)
Platelets: 271 10*3/uL (ref 150–400)
RBC: 4.77 MIL/uL (ref 3.87–5.11)
RDW: 14.1 % (ref 11.5–15.5)
WBC: 6.4 10*3/uL (ref 4.0–10.5)

## 2014-12-23 LAB — HEPATIC FUNCTION PANEL
ALT: 14 U/L (ref 0–35)
AST: 20 U/L (ref 0–37)
Albumin: 4.3 g/dL (ref 3.5–5.2)
Alkaline Phosphatase: 81 U/L (ref 39–117)
Bilirubin, Direct: 0.1 mg/dL (ref 0.0–0.3)
Indirect Bilirubin: 0.5 mg/dL (ref 0.2–1.2)
Total Bilirubin: 0.6 mg/dL (ref 0.2–1.2)
Total Protein: 7 g/dL (ref 6.0–8.3)

## 2014-12-23 LAB — BASIC METABOLIC PANEL WITH GFR
BUN: 20 mg/dL (ref 6–23)
CO2: 29 meq/L (ref 19–32)
Calcium: 9.5 mg/dL (ref 8.4–10.5)
Chloride: 102 mEq/L (ref 96–112)
Creat: 0.92 mg/dL (ref 0.50–1.10)
GFR, Est African American: 70 mL/min
GFR, Est Non African American: 61 mL/min
Glucose, Bld: 94 mg/dL (ref 70–99)
Potassium: 4.3 meq/L (ref 3.5–5.3)
Sodium: 138 mEq/L (ref 135–145)

## 2014-12-23 LAB — MAGNESIUM: Magnesium: 2 mg/dL (ref 1.5–2.5)

## 2014-12-23 LAB — TSH: TSH: 1.279 u[IU]/mL (ref 0.350–4.500)

## 2014-12-23 MED ORDER — NEOMYCIN-POLYMYXIN-DEXAMETH 3.5-10000-0.1 OP SUSP: 1.0000 [drp] | Freq: Four times a day (QID) | OPHTHALMIC | Status: DC

## 2014-12-23 NOTE — Patient Instructions (Signed)
Benefiber is good for constipation/diarrhea/irritable bowel syndrome, it helps with weight loss and can help lower your bad cholesterol. Please do 1-2 TBSP in the morning in water, coffee, or tea. It can take up to a month before you can see a difference with your bowel movements. It is cheapest from [employer], [employer], [employer].    Preventive Care for Adults A healthy lifestyle and preventive care can promote health and wellness. Preventive health guidelines for women include the following key practices.  A routine yearly physical is a good way to check with your health care provider about your health and preventive screening. It is a chance to share any concerns and updates on your health and to receive a thorough exam.  Visit your dentist for a routine exam and preventive care every 6 months. Brush your teeth twice a day and floss once a day. Good oral hygiene prevents tooth decay and gum disease.  The frequency of eye exams is based on your age, health, family medical history, use of contact lenses, and other factors. Follow your health care provider's recommendations for frequency of eye exams.  Eat a healthy diet. Foods like vegetables, fruits, whole grains, low-fat dairy products, and lean protein foods contain the nutrients you need without too many calories. Decrease your intake of foods high in solid fats, added sugars, and salt. Eat the right amount of calories for you.Get information about a proper diet from your health care provider, if necessary.  Regular physical exercise is one of the most important things you can do for your health. Most adults should get at least 150 minutes of moderate-intensity exercise (any activity that increases your heart rate and causes you to sweat) each week. In addition, most adults need muscle-strengthening exercises on 2 or more days a week.  Maintain a healthy weight. The body mass index (BMI) is a screening tool to identify possible weight problems. It  provides an estimate of body fat based on height and weight. Your health care provider can find your BMI and can help you achieve or maintain a healthy weight.For adults 20 years and older:  A BMI below 18.5 is considered underweight.  A BMI of 18.5 to 24.9 is normal.  A BMI of 25 to 29.9 is considered overweight.  A BMI of 30 and above is considered obese.  Maintain normal blood lipids and cholesterol levels by exercising and minimizing your intake of saturated fat. Eat a balanced diet with plenty of fruit and vegetables. If your lipid or cholesterol levels are high, you are over 50, or you are at high risk for heart disease, you may need your cholesterol levels checked more frequently.Ongoing high lipid and cholesterol levels should be treated with medicines if diet and exercise are not working.  If you smoke, find out from your health care provider how to quit. If you do not use tobacco, do not start.  Lung cancer screening is recommended for adults aged 87-80 years who are at high risk for developing lung cancer because of a history of smoking. A yearly low-dose CT scan of the lungs is recommended for people who have at least a 30-pack-year history of smoking and are a current smoker or have quit within the past 15 years. A pack year of smoking is smoking an average of 1 pack of cigarettes a day for 1 year (for example: 1 pack a day for 30 years or 2 packs a day for 15 years). Yearly screening should continue until the smoker has  stopped smoking for at least 15 years. Yearly screening should be stopped for people who develop a health problem that would prevent them from having lung cancer treatment.  Avoid use of street drugs. Do not share needles with anyone. Ask for help if you need support or instructions about stopping the use of drugs.  High blood pressure causes heart disease and increases the risk of stroke.  Ongoing high blood pressure should be treated with medicines if weight loss  and exercise do not work.  If you are 91-63 years old, ask your health care provider if you should take aspirin to prevent strokes.  Diabetes screening involves taking a blood sample to check your fasting blood sugar level. This should be done once every 3 years, after age 15, if you are within normal weight and without risk factors for diabetes. Testing should be considered at a younger age or be carried out more frequently if you are overweight and have at least 1 risk factor for diabetes.  Breast cancer screening is essential preventive care for women. You should practice "breast self-awareness." This means understanding the normal appearance and feel of your breasts and may include breast self-examination. Any changes detected, no matter how small, should be reported to a health care provider. Women in their 19s and 30s should have a clinical breast exam (CBE) by a health care provider as part of a regular health exam every 1 to 3 years. After age 9, women should have a CBE every year. Starting at age 98, women should consider having a mammogram (breast X-ray test) every year. Women who have a family history of breast cancer should talk to their health care provider about genetic screening. Women at a high risk of breast cancer should talk to their health care providers about having an MRI and a mammogram every year.  Breast cancer gene (BRCA)-related cancer risk assessment is recommended for women who have family members with BRCA-related cancers. BRCA-related cancers include breast, ovarian, tubal, and peritoneal cancers. Having family members with these cancers may be associated with an increased risk for harmful changes (mutations) in the breast cancer genes BRCA1 and BRCA2. Results of the assessment will determine the need for genetic counseling and BRCA1 and BRCA2 testing.  Routine pelvic exams to screen for cancer are no longer recommended for nonpregnant women who are considered low risk for  cancer of the pelvic organs (ovaries, uterus, and vagina) and who do not have symptoms. Ask your health care provider if a screening pelvic exam is right for you.  If you have had past treatment for cervical cancer or a condition that could lead to cancer, you need Pap tests and screening for cancer for at least 20 years after your treatment. If Pap tests have been discontinued, your risk factors (such as having a new sexual partner) need to be reassessed to determine if screening should be resumed. Some women have medical problems that increase the chance of getting cervical cancer. In these cases, your health care provider may recommend more frequent screening and Pap tests.    Colorectal cancer can be detected and often prevented. Most routine colorectal cancer screening begins at the age of 27 years and continues through age 24 years. However, your health care provider may recommend screening at an earlier age if you have risk factors for colon cancer. On a yearly basis, your health care provider may provide home test kits to check for hidden blood in the stool. Use of a small camera  at the end of a tube, to directly examine the colon (sigmoidoscopy or colonoscopy), can detect the earliest forms of colorectal cancer. Talk to your health care provider about this at age 45, when routine screening begins. Direct exam of the colon should be repeated every 5-10 years through age 47 years, unless early forms of pre-cancerous polyps or small growths are found.  Osteoporosis is a disease in which the bones lose minerals and strength with aging. This can result in serious bone fractures or breaks. The risk of osteoporosis can be identified using a bone density scan. Women ages 27 years and over and women at risk for fractures or osteoporosis should discuss screening with their health care providers. Ask your health care provider whether you should take a calcium supplement or vitamin D to reduce the rate of  osteoporosis.  Menopause can be associated with physical symptoms and risks. Hormone replacement therapy is available to decrease symptoms and risks. You should talk to your health care provider about whether hormone replacement therapy is right for you.  Use sunscreen. Apply sunscreen liberally and repeatedly throughout the day. You should seek shade when your shadow is shorter than you. Protect yourself by wearing long sleeves, pants, a wide-brimmed hat, and sunglasses year round, whenever you are outdoors.  Once a month, do a whole body skin exam, using a mirror to look at the skin on your back. Tell your health care provider of new moles, moles that have irregular borders, moles that are larger than a pencil eraser, or moles that have changed in shape or color.  Stay current with required vaccines (immunizations).  Influenza vaccine. All adults should be immunized every year.  Tetanus, diphtheria, and acellular pertussis (Td, Tdap) vaccine. Pregnant women should receive 1 dose of Tdap vaccine during each pregnancy. The dose should be obtained regardless of the length of time since the last dose. Immunization is preferred during the 27th-36th week of gestation. An adult who has not previously received Tdap or who does not know her vaccine status should receive 1 dose of Tdap. This initial dose should be followed by tetanus and diphtheria toxoids (Td) booster doses every 10 years. Adults with an unknown or incomplete history of completing a 3-dose immunization series with Td-containing vaccines should begin or complete a primary immunization series including a Tdap dose. Adults should receive a Td booster every 10 years.    Zoster vaccine. One dose is recommended for adults aged 65 years or older unless certain conditions are present.    Pneumococcal 13-valent conjugate (PCV13) vaccine. When indicated, a person who is uncertain of her immunization history and has no record of immunization  should receive the PCV13 vaccine. An adult aged 9 years or older who has certain medical conditions and has not been previously immunized should receive 1 dose of PCV13 vaccine. This PCV13 should be followed with a dose of pneumococcal polysaccharide (PPSV23) vaccine. The PPSV23 vaccine dose should be obtained at least 8 weeks after the dose of PCV13 vaccine. An adult aged 77 years or older who has certain medical conditions and previously received 1 or more doses of PPSV23 vaccine should receive 1 dose of PCV13. The PCV13 vaccine dose should be obtained 1 or more years after the last PPSV23 vaccine dose.    Pneumococcal polysaccharide (PPSV23) vaccine. When PCV13 is also indicated, PCV13 should be obtained first. All adults aged 72 years and older should be immunized. An adult younger than age 31 years who has certain medical conditions  should be immunized. Any person who resides in a nursing home or long-term care facility should be immunized. An adult smoker should be immunized. People with an immunocompromised condition and certain other conditions should receive both PCV13 and PPSV23 vaccines. People with human immunodeficiency virus (HIV) infection should be immunized as soon as possible after diagnosis. Immunization during chemotherapy or radiation therapy should be avoided. Routine use of PPSV23 vaccine is not recommended for American Indians, Advance Natives, or people younger than 65 years unless there are medical conditions that require PPSV23 vaccine. When indicated, people who have unknown immunization and have no record of immunization should receive PPSV23 vaccine. One-time revaccination 5 years after the first dose of PPSV23 is recommended for people aged 19-64 years who have chronic kidney failure, nephrotic syndrome, asplenia, or immunocompromised conditions. People who received 1-2 doses of PPSV23 before age 23 years should receive another dose of PPSV23 vaccine at age 88 years or later if at  least 5 years have passed since the previous dose. Doses of PPSV23 are not needed for people immunized with PPSV23 at or after age 40 years.   Preventive Services / Frequency  Ages 15 years and over  Blood pressure check.  Lipid and cholesterol check.  Lung cancer screening. / Every year if you are aged 25-80 years and have a 30-pack-year history of smoking and currently smoke or have quit within the past 15 years. Yearly screening is stopped once you have quit smoking for at least 15 years or develop a health problem that would prevent you from having lung cancer treatment.  Clinical breast exam.** / Every year after age 35 years.  BRCA-related cancer risk assessment.** / For women who have family members with a BRCA-related cancer (breast, ovarian, tubal, or peritoneal cancers).  Mammogram.** / Every year beginning at age 5 years and continuing for as long as you are in good health. Consult with your health care provider.  Pap test.** / Every 3 years starting at age 12 years through age 36 or 46 years with 3 consecutive normal Pap tests. Testing can be stopped between 65 and 70 years with 3 consecutive normal Pap tests and no abnormal Pap or HPV tests in the past 10 years.  Fecal occult blood test (FOBT) of stool. / Every year beginning at age 30 years and continuing until age 48 years. You may not need to do this test if you get a colonoscopy every 10 years.  Flexible sigmoidoscopy or colonoscopy.** / Every 5 years for a flexible sigmoidoscopy or every 10 years for a colonoscopy beginning at age 4 years and continuing until age 30 years.  Hepatitis C blood test.** / For all people born from [birth year] through 1967 and any individual with known risks for hepatitis C.  Osteoporosis screening.** / A one-time screening for women ages 30 years and over and women at risk for fractures or osteoporosis.  Skin self-exam. / Monthly.  Influenza vaccine. / Every year.  Tetanus, diphtheria, and  acellular pertussis (Tdap/Td) vaccine.** / 1 dose of Td every 10 years.  Zoster vaccine.** / 1 dose for adults aged 75 years or older.  Pneumococcal 13-valent conjugate (PCV13) vaccine.** / Consult your health care provider.  Pneumococcal polysaccharide (PPSV23) vaccine.** / 1 dose for all adults aged 66 years and older. Screening for abdominal aortic aneurysm (AAA)  by ultrasound is recommended for people who have history of high blood pressure or who are current or former smokers.

## 2014-12-23 NOTE — Progress Notes (Signed)
MEDICARE ANNUAL WELLNESS VISIT AND CPE  Assessment:   1. Essential hypertension - - continue medications, DASH diet, exercise and monitor at home. Call if greater than 130/80.  - EKG 12-Lead - [place], RETROPERITNL ABD,  LTD  2. Hyperlipidemia -continue medications, check lipids, decrease fatty foods, increase activity.   3. Vitamin D deficiency Continue supplement  4. Tetanus  5. Depression remission  6. Cancer of right breast Continue close follow ups  7. Osteopenia - continue evista, will get DEXA 2 years.   8. Conjunctivitis, allergic  Maxitrol and follow up with eye doctor   Plan:   During the course of the visit the patient was educated and counseled about appropriate screening and preventive services including:    Pneumococcal vaccine   Influenza vaccine  Td vaccine  Screening electrocardiogram  Screening mammography  Bone densitometry screening  Colorectal cancer screening  Diabetes screening  Glaucoma screening  Nutrition counseling   Advanced directives: given information/requested  Conditions/risks identified: BMI: Discussed weight loss, diet, and increase physical activity.  Increase physical activity: AHA recommends 150 minutes of physical activity a week.  Medications reviewed DEXA- ordered Urinary Incontinence is an issue with stress incontinence discussed non pharmacology and pharmacology options.  Fall risk: low- discussed PT, home fall assessment, medications.   Subjective:   Tiffany Vaughn is a 74 y.o. female who presents for Medicare Annual Wellness Visit and complete physical.    Date of last medicare wellness visit was 12/17/2013  Her blood pressure has been controlled at home, today their BP is BP: 120/72 mmHg She does not workout but she goes to [place] often and walks when she is there. She denies chest pain, shortness of breath, dizziness.  She is on cholesterol medication, lipitor 20 mg daily and denies myalgias. Her  cholesterol is at goal. The cholesterol last visit was:   Lab Results  Component Value Date   CHOL 162 08/20/2014   HDL 43* 08/20/2014   LDLCALC 95 08/20/2014   TRIG 119 08/20/2014   CHOLHDL 3.8 08/20/2014   Patient is on Vitamin D supplement.   She is on Evista for history of previous right breast cancer.  Has been going over to her niece's who has cats and states that she has been having itchy watery eyes, no changes in vision.   Medication Review Current Outpatient Prescriptions on File Prior to Visit  Medication Sig Dispense Refill  . ASPIRIN PO Take 81 mg by mouth daily.    Marland Kitchen atorvastatin (LIPITOR) 20 MG tablet TAKE 1 TABLET BY MOUTH AT BEDTIME 90 tablet 3  . Cholecalciferol (VITAMIN D PO) Take by mouth daily.    Marland Kitchen MAGNESIUM PO Take by mouth daily.    . metoprolol succinate (TOPROL-XL) 50 MG 24 hr tablet TAKE 1 TABLET BY MOUTH EVERY DAY 90 tablet 3  . Omega-3 Fatty Acids (FISH OIL PO) Take by mouth daily.    . raloxifene (EVISTA) 60 MG tablet TAKE 1 TABLET BY MOUTH EVERY DAY 90 tablet 3   No current facility-administered medications on file prior to visit.    Current Problems (verified) Patient Active Problem List   Diagnosis Date Noted  . Osteopenia 12/23/2014  . Medication management 10/24/2013  . Hyperlipidemia   . Hypertension   . Vitamin D deficiency   . Depression   . Allergy   . Cancer of right breast     Screening Tests Health Maintenance  Topic Date Due  . INFLUENZA VACCINE  01/27/2015  . TETANUS/TDAP  04/16/2015  . COLONOSCOPY  06/05/2024  . DEXA SCAN  Completed  . ZOSTAVAX  Completed  . PNA vac Low Risk Adult  Completed    Immunization History  Administered Date(s) Administered  . Pneumococcal Conjugate-13 12/17/2013  . Pneumococcal Polysaccharide-23 10/23/2009  . Td 04/15/2005  . Zoster 11/19/2010   Preventative care: Last colonoscopy: 05/2014  Dr. Henrene Pastor Last mammogram: 11/2013, [place] right breast 11/2013 Last pap smear/pelvic exam: 2009  remote DEXA: 01/2014, t -1.6, osteopenia Cardiolite 2011 negative EF 65%   Prior vaccinations: TD : 2006 DUE  Influenza: 2015 at drug store  Pneumococcal: 2011 Prevnar 13: 2015 Shingles/Zostavax: 2012  Names of Other Physician/Practitioners you currently use: 1. [facility]- here for primary care 2. Dr. Almyra Deforest, eye doctor, last visit seeing 11/2013, has appointment in August 3. Dr. Orlando Penner, dentist, last visit q 6 months Patient Care Team: Unk Pinto, MD as PCP - General (Internal Medicine) Irene Shipper, MD as Consulting Physician (Gastroenterology) Janit Pagan, MD as Referring Physician (Cardiology)  Allergies Allergies  Allergen Reactions  . Codeine    Surgical history Past Surgical History  Procedure Laterality Date  . Tonsillectomy and adenoidectomy    . Abdominal hysterectomy      partial  . Breast lumpectomy Right    Family history Family History  Problem Relation Age of Onset  . Heart attack Mother   . Heart attack Father   . Leukemia Brother    Tobacco History  Substance Use Topics  . Smoking status: Never Smoker   . Smokeless tobacco: Not on file  . Alcohol Use: No   MEDICARE WELLNESS OBJECTIVES: Tobacco use: She does not smoke.  Patient is not a former smoker. Alcohol Current alcohol use: none Caffeine Current caffeine use: denies use Osteoporosis: postmenopausal estrogen deficiency and dietary calcium and/or vitamin D deficiency, History of fracture in the past year: no Diet: in general, a "healthy" diet   Physical activity: no regular exercise at this time Depression/mood screen:   Depression screen [facility] 2/9 12/23/2014  Decreased Interest 0  Down, Depressed, Hopeless 0  PHQ - 2 Score 0   Hearing: normal Visual acuity: normal,  does perform annual eye exam  ADLs:  In your present state of health, do you have any difficulty performing the following activities: 12/23/2014  Hearing? N  Vision? N   Difficulty concentrating or making decisions? N  Walking or climbing stairs? N  Dressing or bathing? N  Doing errands, shopping? N  Preparing Food and eating ? N  Using the Toilet? N  In the past six months, have you accidently leaked urine? N  Do you have problems with loss of bowel control? N  Managing your Medications? N  Managing your Finances? N  Housekeeping or managing your Housekeeping? N    Fall risk: Low Risk Cognitive Testing  Alert? Yes  Normal Appearance?Yes  Oriented to person? Yes  Place? Yes   Time? Yes  Recall of three objects?  Yes  Can perform simple calculations? Yes  Displays appropriate judgment?Yes  Can read the correct time from a watch face?Yes  EOL planning: Does patient have an advance directive?: Yes Type of Advance Directive: [place], Living will Does patient want to make changes to advanced directive?: No - Patient declined Copy of advanced directive(s) in chart?: Yes    Objective:     Blood pressure 120/72, pulse 60, temperature 97.7 F (36.5 C), resp. rate 16, height 5\' 5"  (1.651 m),  weight 133 lb (60.328 kg). Body mass index is 22.13 kg/(m^2).  General appearance: alert, no distress, WD/WN,  female HEENT: normocephalic, sclerae anicteric, TMs pearly, nares patent, no discharge or erythema, pharynx normal Oral cavity: MMM, no lesions Neck: supple, no lymphadenopathy, no thyromegaly, no masses Heart: RRR, normal S1, S2, no murmurs Lungs: CTA bilaterally, no wheezes, rhonchi, or rales Abdomen: +bs, soft, non tender, non distended, no masses, no hepatomegaly, no splenomegaly Musculoskeletal: nontender, no swelling, no obvious deformity Extremities: no edema, no cyanosis, no clubbing Pulses: 2+ symmetric, upper and lower extremities, normal cap refill Neurological: alert, oriented x 3, CN2-12 intact, strength normal upper extremities and lower extremities, sensation normal throughout, DTRs 2+ throughout, no cerebellar  signs, gait normal Psychiatric: normal affect, behavior normal, pleasant  Breast: defer Gyn: defer  Rectal: defer  Medicare Attestation I have personally reviewed: The patient's medical and social history Their use of alcohol, tobacco or illicit drugs Their current medications and supplements The patient's functional ability including ADLs,fall risks, home safety risks, cognitive, and hearing and visual impairment Diet and physical activities Evidence for depression or mood disorders  The patient's weight, height, BMI, and visual acuity have been recorded in the chart.  I have made referrals, counseling, and provided education to the patient based on review of the above and I have provided the patient with a written personalized care plan for preventive services.     Vicie Mutters, PA-C   12/23/2014

## 2014-12-24 LAB — MICROALBUMIN / CREATININE URINE RATIO
Creatinine, Urine: 169.4 mg/dL
Microalb Creat Ratio: 3.5 mg/g (ref 0.0–30.0)
Microalb, Ur: 0.6 mg/dL (ref <2.0)

## 2014-12-24 LAB — VITAMIN D 25 HYDROXY (VIT D DEFICIENCY, FRACTURES): Vit D, 25-Hydroxy: 38 ng/mL (ref 30–100)

## 2014-12-24 LAB — URINALYSIS, ROUTINE W REFLEX MICROSCOPIC
Bilirubin Urine: NEGATIVE
Glucose, UA: NEGATIVE mg/dL
Hgb urine dipstick: NEGATIVE
Ketones, ur: NEGATIVE mg/dL
Leukocytes, UA: NEGATIVE
Nitrite: NEGATIVE
Protein, ur: NEGATIVE mg/dL
Specific Gravity, Urine: 1.022 (ref 1.005–1.030)
Urobilinogen, UA: 0.2 mg/dL (ref 0.0–1.0)
pH: 5 (ref 5.0–8.0)

## 2015-01-29 IMAGING — US US BREAST LTD UNI RIGHT INC AXILLA
1 series · 13 of 14 positions shown · non-contrast
Comparison: Priors

CLINICAL DATA: Patient presents for evaluation of palpable
thickening within the superior aspect of the right breast.

EXAM:
DIGITAL DIAGNOSTIC  RIGHT MAMMOGRAM WITH CAD
ULTRASOUND RIGHT BREAST

[Series 1: us breast ltd uni right inc axilla · 13 of 14 slices shown]
[im 1/14]
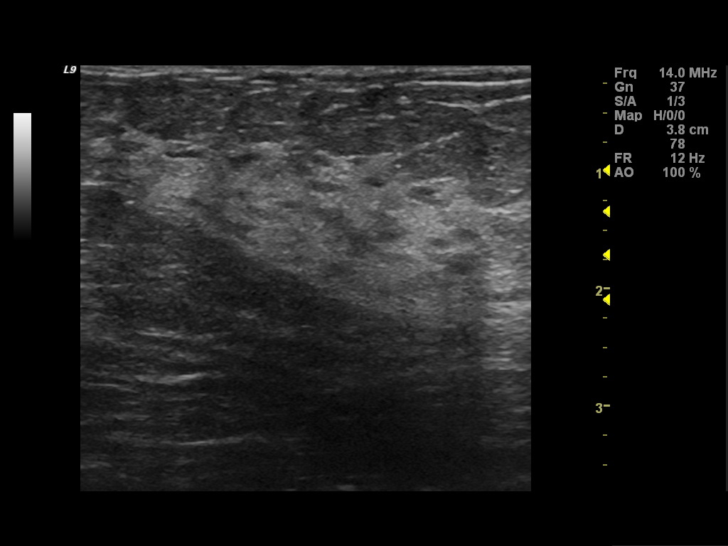
[im 2/14]
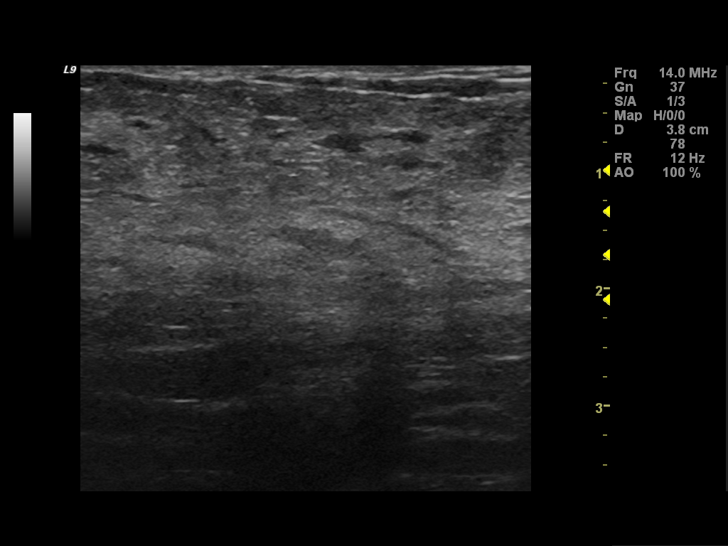
[im 3/14]
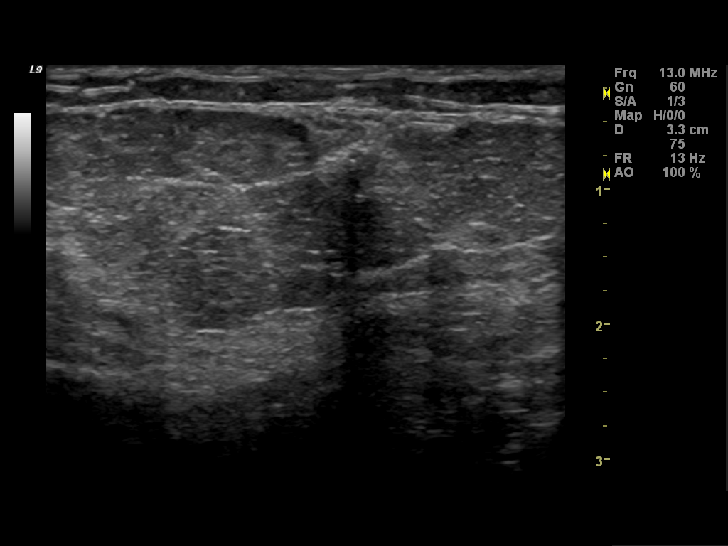
[im 4/14]
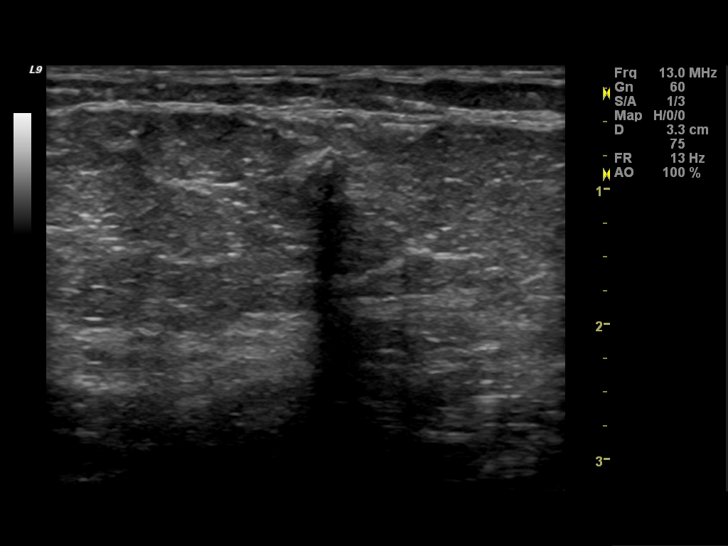
[im 5/14]
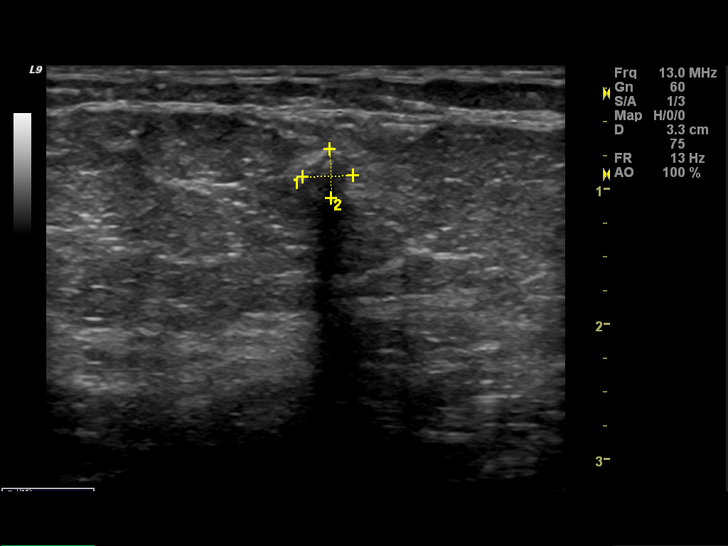
[im 6/14]
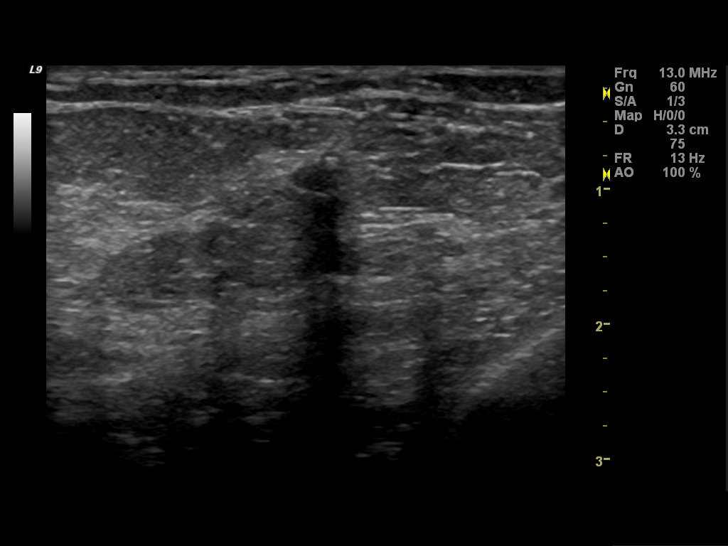
[im 8/14]
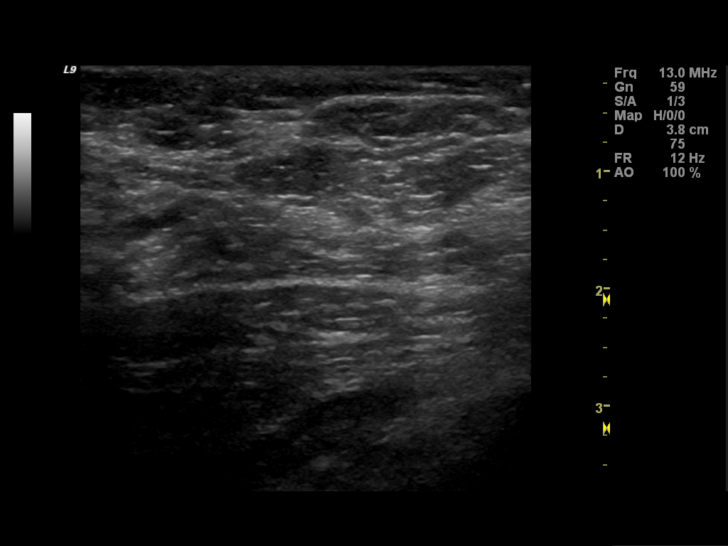
[im 9/14]
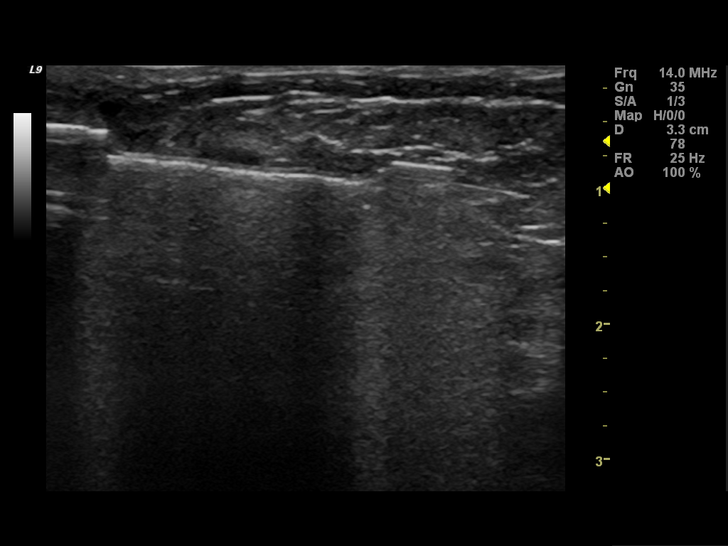
[im 10/14]
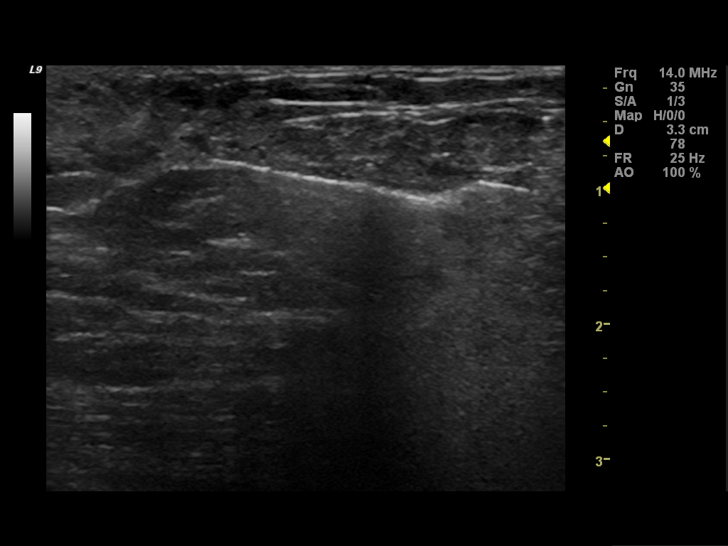
[im 11/14]
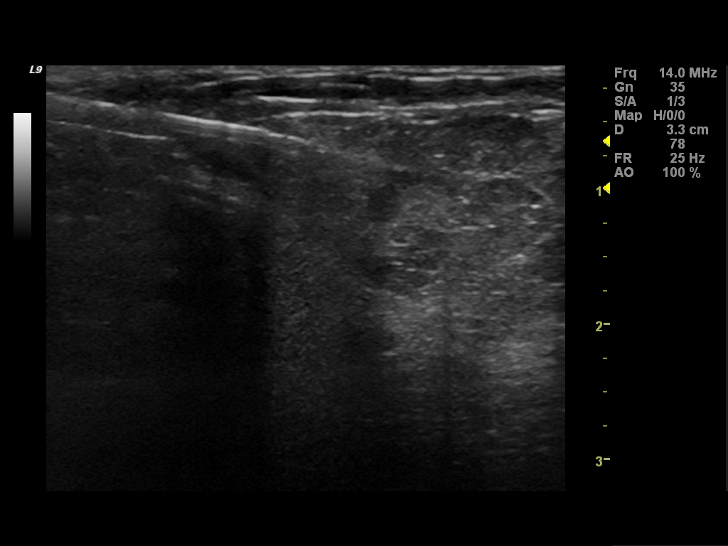
[im 12/14]
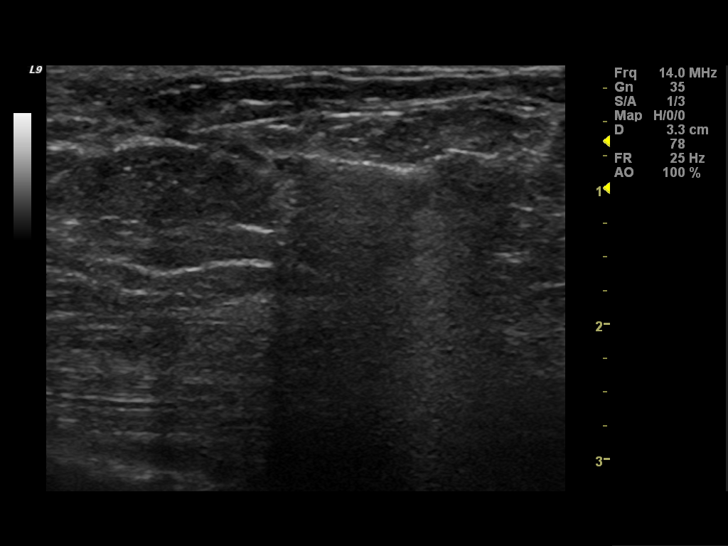
[im 13/14]
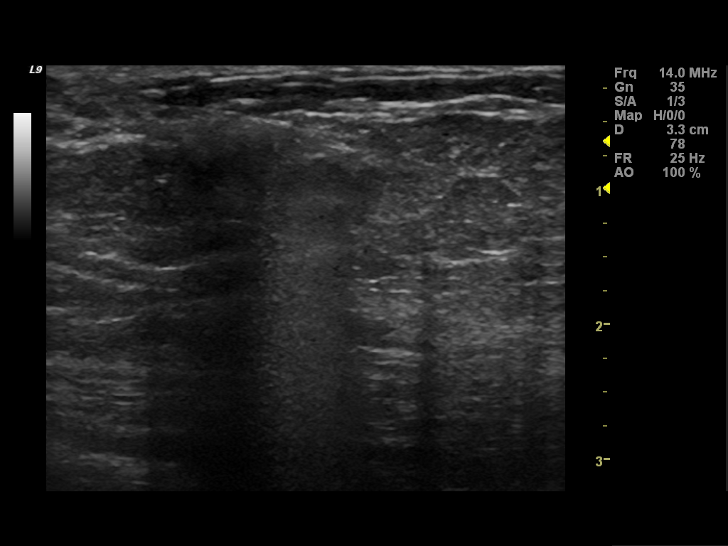
[im 14/14]
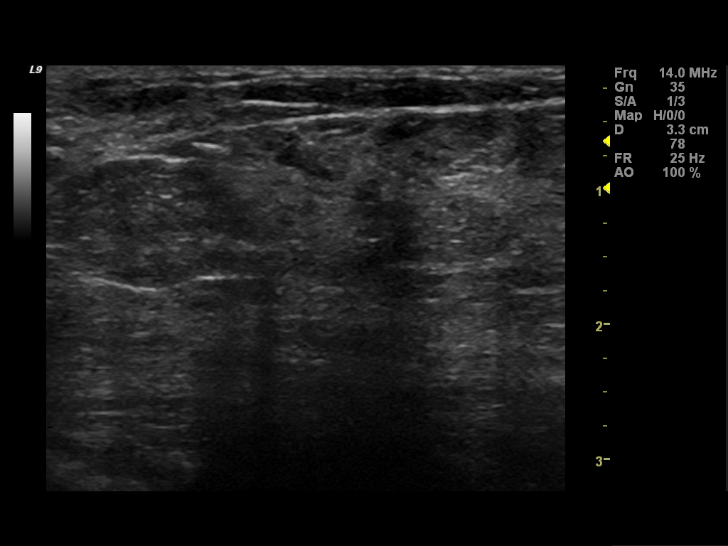

[13 of 14 positions shown; findings below may reference images not displayed]

ACR Breast Density Category c: The breast tissue is heterogeneously
dense, which may obscure small masses.
FINDINGS: No concerning masses, calcifications or architectural distortion
identified within the right breast.

Mammographic images were processed with CAD.

On physical exam, thickening is palpated within the 12 o'clock
position right breast.

Ultrasound is performed, showing normal dense fibroglandular tissue
at the area of palpable thickening within the 12 o'clock position
right breast 5 cm from the nipple. Additionally within the right
breast 1 o'clock position 7 cm from the nipple a 5 x 4 x 4 mm
irregular hypoechoic mass with posterior acoustic shadowing is
demonstrated.
IMPRESSION: 1. Irregular right breast mass.
2. Palpable thickening corresponds with dense fibroglandular breast
tissue.

RECOMMENDATION:
Ultrasound-guided core needle biopsy suspicious right breast mass.

This will be performed same day.

I have discussed the findings and recommendations with the patient.
Results were also provided in writing at the conclusion of the
visit. If applicable, a reminder letter will be sent to the patient
regarding the next appointment.

BI-RADS CATEGORY  4: Suspicious.

## 2015-01-29 IMAGING — MG MM DIGITAL DIAGNOSTIC UNILAT*R*
2 series · 2 of 2 positions shown · non-contrast
Comparison: Priors

CLINICAL DATA: Patient presents for evaluation of palpable
thickening within the superior aspect of the right breast.

EXAM:
DIGITAL DIAGNOSTIC  RIGHT MAMMOGRAM WITH CAD
ULTRASOUND RIGHT BREAST

[R CC]
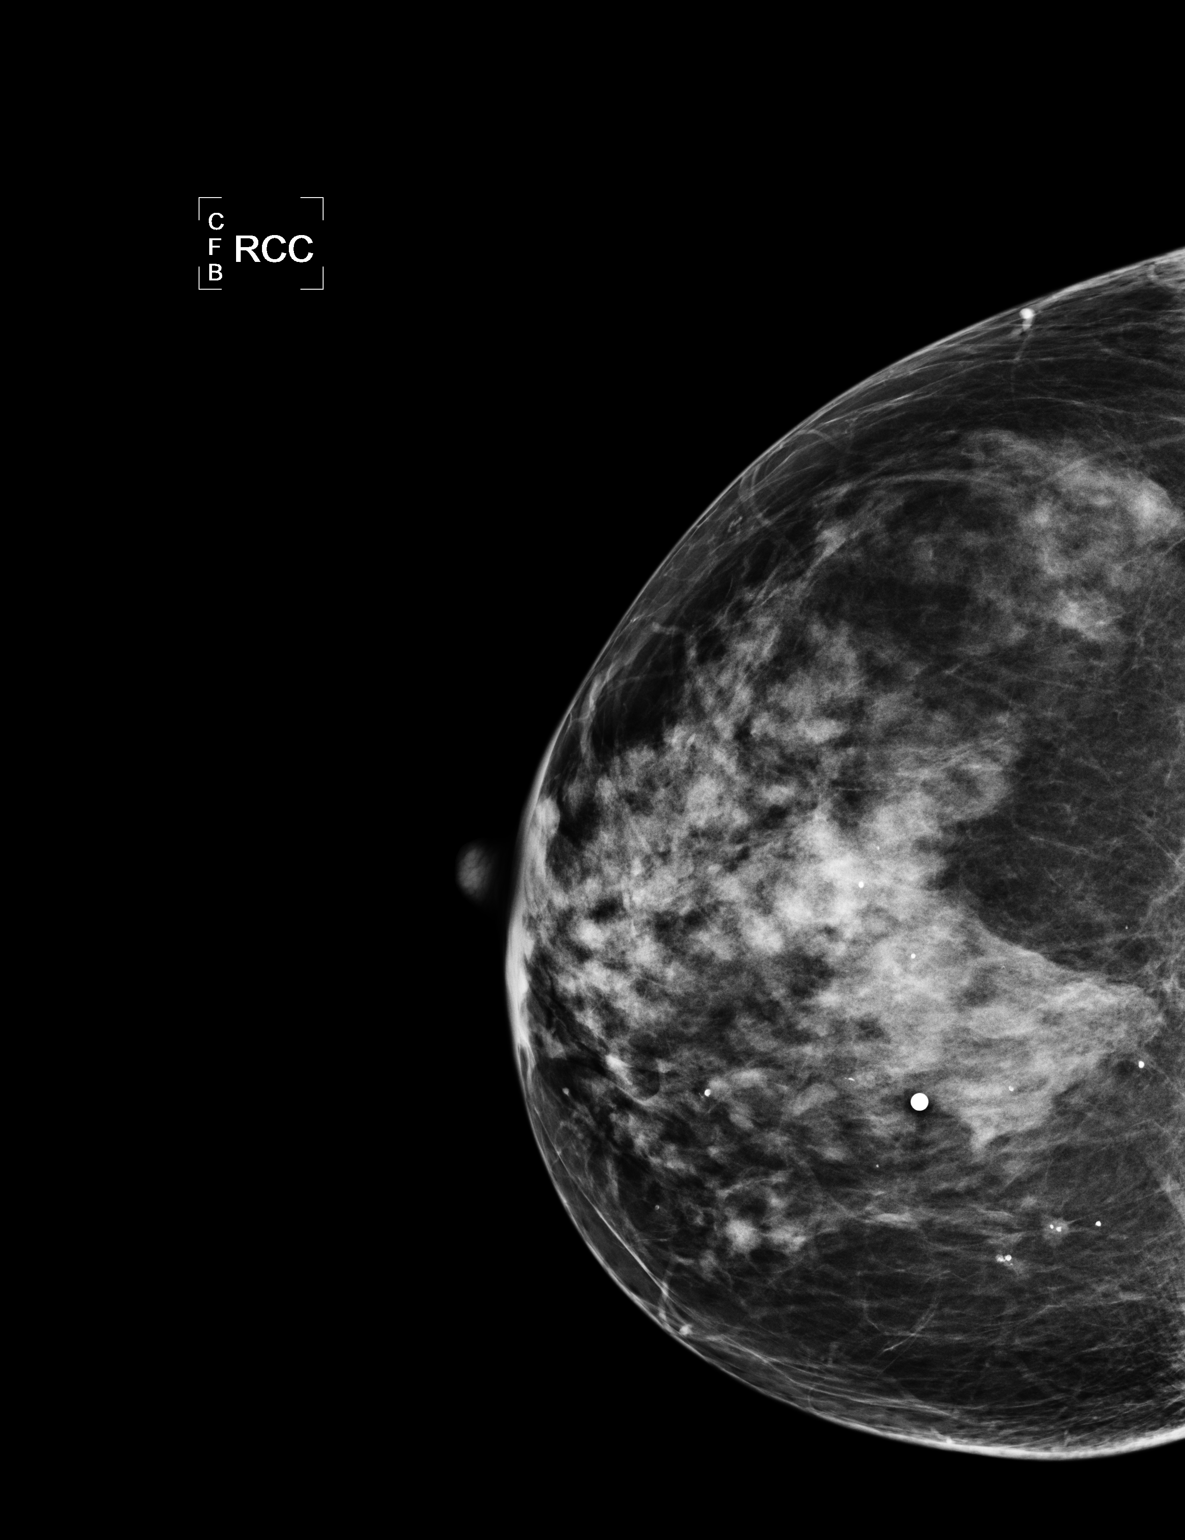

[R MLO]
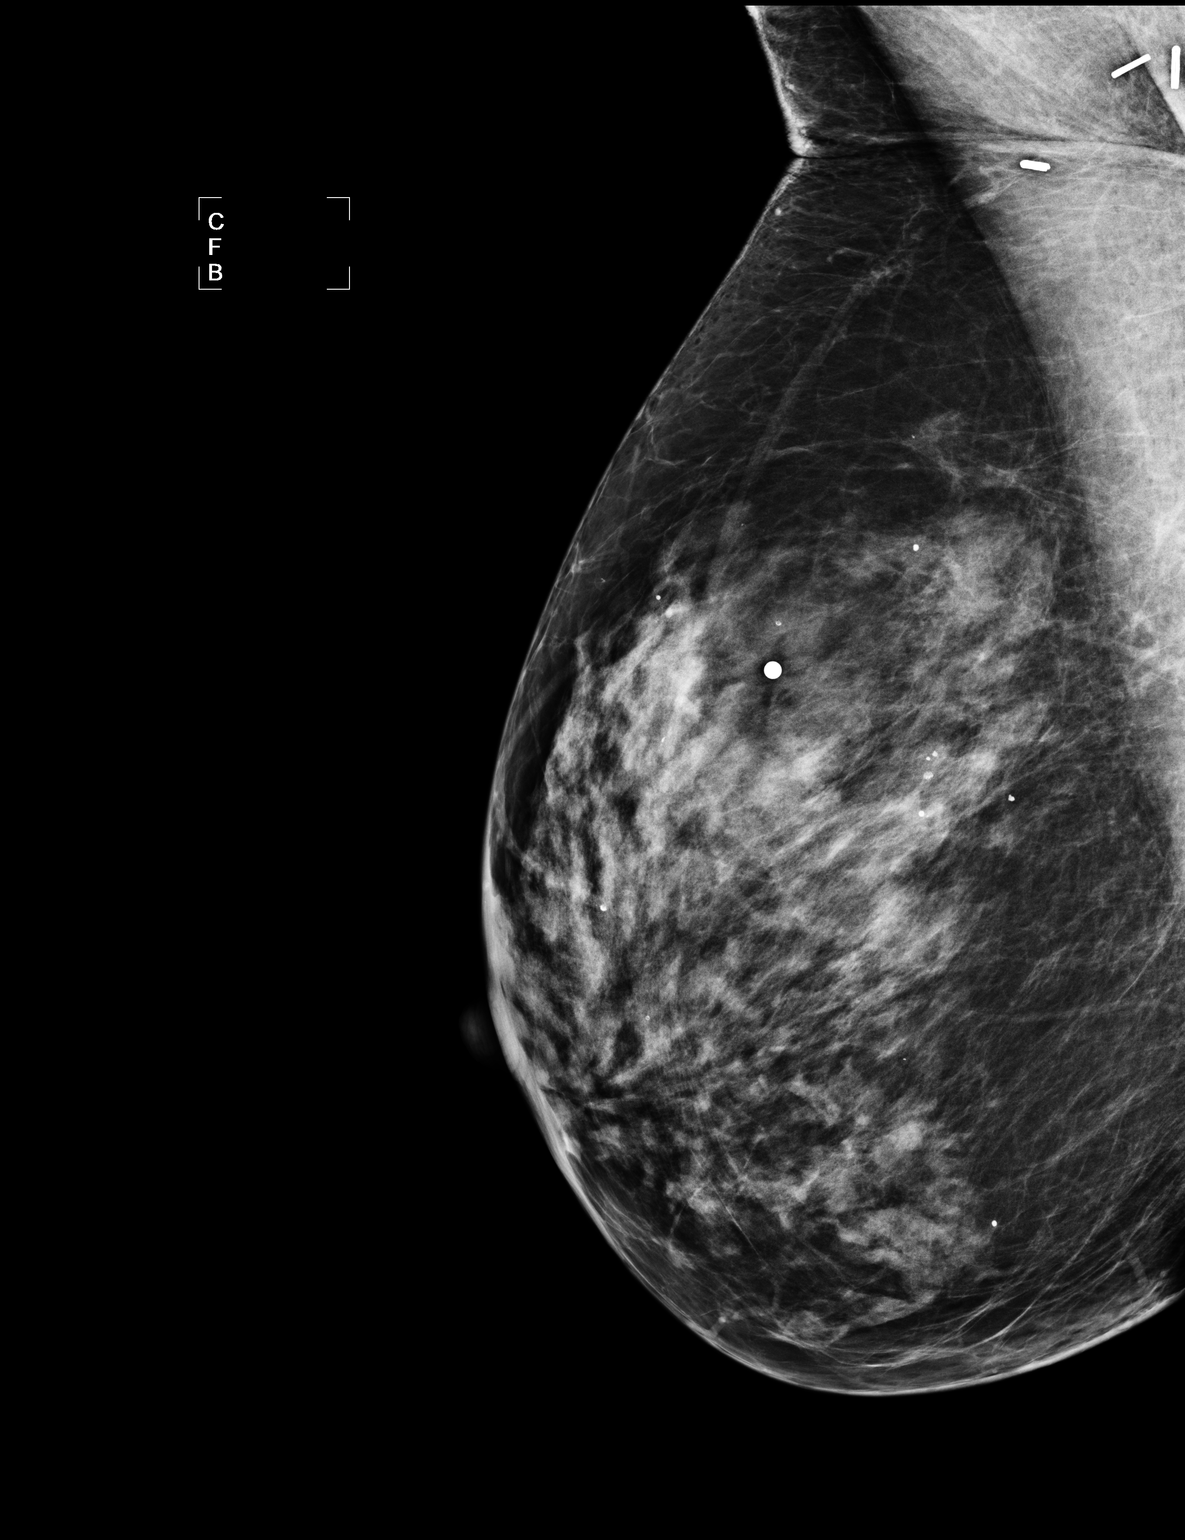

[2 of 2 positions shown; findings below may reference images not displayed]

ACR Breast Density Category c: The breast tissue is heterogeneously
dense, which may obscure small masses.
FINDINGS: No concerning masses, calcifications or architectural distortion
identified within the right breast.

Mammographic images were processed with CAD.

On physical exam, thickening is palpated within the 12 o'clock
position right breast.

Ultrasound is performed, showing normal dense fibroglandular tissue
at the area of palpable thickening within the 12 o'clock position
right breast 5 cm from the nipple. Additionally within the right
breast 1 o'clock position 7 cm from the nipple a 5 x 4 x 4 mm
irregular hypoechoic mass with posterior acoustic shadowing is
demonstrated.
IMPRESSION: 1. Irregular right breast mass.
2. Palpable thickening corresponds with dense fibroglandular breast
tissue.

RECOMMENDATION:
Ultrasound-guided core needle biopsy suspicious right breast mass.

This will be performed same day.

I have discussed the findings and recommendations with the patient.
Results were also provided in writing at the conclusion of the
visit. If applicable, a reminder letter will be sent to the patient
regarding the next appointment.

BI-RADS CATEGORY  4: Suspicious.

## 2015-02-25 ENCOUNTER — Other Ambulatory Visit: Payer: Self-pay

## 2015-05-06 ENCOUNTER — Encounter: Payer: Self-pay | Admitting: Internal Medicine

## 2015-05-18 ENCOUNTER — Encounter: Payer: Self-pay | Admitting: *Deleted

## 2015-07-18 ENCOUNTER — Ambulatory Visit: Payer: Self-pay | Admitting: Internal Medicine

## 2015-09-08 ENCOUNTER — Encounter (INDEPENDENT_AMBULATORY_CARE_PROVIDER_SITE_OTHER): Payer: Self-pay

## 2015-09-08 ENCOUNTER — Encounter: Payer: Self-pay | Admitting: Internal Medicine

## 2015-09-08 ENCOUNTER — Ambulatory Visit (INDEPENDENT_AMBULATORY_CARE_PROVIDER_SITE_OTHER): Payer: PPO | Admitting: Internal Medicine

## 2015-09-08 VITALS — BP 118/74 | HR 72 | Temp 97.5°F | Resp 16 | Ht 65.0 in | Wt 139.4 lb

## 2015-09-08 DIAGNOSIS — Z79899 Other long term (current) drug therapy: Secondary | ICD-10-CM

## 2015-09-08 DIAGNOSIS — I10 Essential (primary) hypertension: Secondary | ICD-10-CM

## 2015-09-08 DIAGNOSIS — E785 Hyperlipidemia, unspecified: Secondary | ICD-10-CM

## 2015-09-08 DIAGNOSIS — Z131 Encounter for screening for diabetes mellitus: Secondary | ICD-10-CM | POA: Diagnosis not present

## 2015-09-08 DIAGNOSIS — E559 Vitamin D deficiency, unspecified: Secondary | ICD-10-CM

## 2015-09-08 LAB — CBC WITH DIFFERENTIAL/PLATELET
Basophils Absolute: 0 10*3/uL (ref 0.0–0.1)
Basophils Relative: 0 % (ref 0–1)
Eosinophils Absolute: 0.3 10*3/uL (ref 0.0–0.7)
Eosinophils Relative: 4 % (ref 0–5)
HCT: 40.1 % (ref 36.0–46.0)
Hemoglobin: 13.1 g/dL (ref 12.0–15.0)
Lymphocytes Relative: 30 % (ref 12–46)
Lymphs Abs: 2 10*3/uL (ref 0.7–4.0)
MCH: 28.4 pg (ref 26.0–34.0)
MCHC: 32.7 g/dL (ref 30.0–36.0)
MCV: 86.8 fL (ref 78.0–100.0)
MPV: 10.9 fL (ref 8.6–12.4)
Monocytes Absolute: 0.7 10*3/uL (ref 0.1–1.0)
Monocytes Relative: 11 % (ref 3–12)
Neutro Abs: 3.7 10*3/uL (ref 1.7–7.7)
Neutrophils Relative %: 55 % (ref 43–77)
Platelets: 276 10*3/uL (ref 150–400)
RBC: 4.62 MIL/uL (ref 3.87–5.11)
RDW: 14.2 % (ref 11.5–15.5)
WBC: 6.8 10*3/uL (ref 4.0–10.5)

## 2015-09-08 LAB — HEMOGLOBIN A1C
Hgb A1c MFr Bld: 6 % — ABNORMAL HIGH (ref <5.7)
Mean Plasma Glucose: 126 mg/dL — ABNORMAL HIGH (ref <117)

## 2015-09-08 LAB — BASIC METABOLIC PANEL WITH GFR
BUN: 19 mg/dL (ref 7–25)
CO2: 26 mmol/L (ref 20–31)
Calcium: 9.3 mg/dL (ref 8.6–10.4)
Chloride: 105 mmol/L (ref 98–110)
Creat: 0.86 mg/dL (ref 0.60–0.93)
GFR, Est African American: 76 mL/min (ref >=60)
GFR, Est Non African American: 66 mL/min (ref >=60)
Glucose, Bld: 99 mg/dL (ref 65–99)
Potassium: 4.5 mmol/L (ref 3.5–5.3)
Sodium: 139 mmol/L (ref 135–146)

## 2015-09-08 LAB — LIPID PANEL
Cholesterol: 178 mg/dL (ref 125–200)
HDL: 39 mg/dL — ABNORMAL LOW (ref >=46)
LDL Cholesterol: 105 mg/dL (ref <130)
Total CHOL/HDL Ratio: 4.6 {ratio} (ref <=5.0)
Triglycerides: 168 mg/dL — ABNORMAL HIGH (ref <150)
VLDL: 34 mg/dL — ABNORMAL HIGH (ref <30)

## 2015-09-08 LAB — HEPATIC FUNCTION PANEL
ALT: 15 U/L (ref 6–29)
AST: 17 U/L (ref 10–35)
Albumin: 4 g/dL (ref 3.6–5.1)
Alkaline Phosphatase: 76 U/L (ref 33–130)
Bilirubin, Direct: 0.1 mg/dL (ref <=0.2)
Indirect Bilirubin: 0.3 mg/dL (ref 0.2–1.2)
Total Bilirubin: 0.4 mg/dL (ref 0.2–1.2)
Total Protein: 6.4 g/dL (ref 6.1–8.1)

## 2015-09-08 LAB — MAGNESIUM: Magnesium: 2 mg/dL (ref 1.5–2.5)

## 2015-09-08 LAB — TSH: TSH: 2.18 mIU/L

## 2015-09-08 NOTE — Patient Instructions (Signed)

## 2015-09-08 NOTE — Progress Notes (Signed)
Patient ID: Tiffany Vaughn, female   DOB: 1941/04/17, 75 y.o.   MRN: [id number]     This very nice 75 y.o. MWF presents overdue  forfollow up with Hypertension, Hyperlipidemia, Pre-Diabetes and Vitamin D Deficiency. Also , pt is s/p L breast lumpectomy for Breast Ca.      Patient is treated for HTN circa 2004 & BP has been controlled at home. Patient had neg Cardiolite stress tests in 2005 & 2011. Today's BP: 118/74 mmHg. Patient has had no complaints of any cardiac type chest pain, palpitations, dyspnea/orthopnea/PND, dizziness, claudication, or dependent edema.     Hyperlipidemia is controlled with diet & meds. Patient denies myalgias or other med SE's. Last Lipids were Cholesterol 183; HDL 46; LDL Cholesterol 101*; Triglycerides 179 on 12/23/2014.     Also, the patient is screened expectantly for PreDiabetes and has had no symptoms of reactive hypoglycemia, diabetic polys, paresthesias or visual blurring.        Further, the patient also has history of Vitamin D Deficiency of "23" in 2008 and supplements vitamin D without any suspected side-effects. Last vitamin D was still low at 38 on 12/23/2014.    Medication Sig  . ASPIRIN 81 mg Take   daily.  Marland Kitchen atorvastatin 20 MG tablet TAKE 1 TAB AT BEDTIME  . VITAMIN D ??? unk dose Take 1  daily.  Marland Kitchen MAGNESIUM ??? Ukn dose Take  daily.  . metoprolol succinate-XL 50 MG  TAKE 1 TAB EVERY DAY  . MAXITROL SUSP Place 1 drop into the left eye 4 times daily.  . Omega-3 FISH OIL  Take  daily.  . raloxifene  60 MG tablet TAKE 1 TAB EVERY DAY   Allergies  Allergen Reactions  . Codeine     PMHx:   Past Medical History  Diagnosis Date  . Hyperlipidemia   . Hypertension   . Vitamin D deficiency   . Depression   . Allergy   . Cancer of right breast ([place]) 1992   Immunization History  Administered Date(s) Administered  . DT 12/23/2014  . Pneumococcal Conjugate-13 12/17/2013  . Pneumococcal Polysaccharide-23 10/23/2009  . Td 04/15/2005  . Zoster  11/19/2010   Past Surgical History  Procedure Laterality Date  . Tonsillectomy and adenoidectomy    . Abdominal hysterectomy      partial  . Breast lumpectomy Right    FHx:    Reviewed / unchanged  SHx:    Reviewed / unchanged  Systems Review:  Constitutional: Denies fever, chills, wt changes, headaches, insomnia, fatigue, night sweats, change in appetite. Eyes: Denies redness, blurred vision, diplopia, discharge, itchy, watery eyes. C/o glare & photophobia with L eye since Bilat CE/IOL surg last year. ENT: Denies discharge, congestion, post nasal drip, epistaxis, sore throat, earache, hearing loss, dental pain, tinnitus, vertigo, sinus pain, snoring.  CV: Denies chest pain, palpitations, irregular heartbeat, syncope, dyspnea, diaphoresis, orthopnea, PND, claudication or edema. Respiratory: denies cough, dyspnea, DOE, pleurisy, hoarseness, laryngitis, wheezing.  Gastrointestinal: Denies dysphagia, odynophagia, heartburn, reflux, water brash, abdominal pain or cramps, nausea, vomiting, bloating, diarrhea, constipation, hematemesis, melena, hematochezia  or hemorrhoids. Genitourinary: Denies dysuria, frequency, urgency, nocturia, hesitancy, discharge, hematuria or flank pain. Musculoskeletal: Denies arthralgias, myalgias, stiffness, jt. swelling, pain, limping or strain/sprain.  Skin: Denies pruritus, rash, hives, warts, acne, eczema or change in skin lesion(s). Neuro: No weakness, tremor, incoordination, spasms, paresthesia or pain. Psychiatric: Denies confusion, memory loss or sensory loss. Endo: Denies change in weight, skin or hair change.  Heme/Lymph: No excessive bleeding,  bruising or enlarged lymph nodes.  Physical Exam  BP 118/74 mmHg  Pulse 72  Temp(Src) 97.5 F (36.4 C)  Resp 16  Ht 5\' 5"  (1.651 m)  Wt 139 lb 6.4 oz (63.231 kg)  BMI 23.20 kg/m2  Appears well nourished and in no distress. Eyes: PERRLA, EOMs, conjunctiva no swelling or erythema. Sinuses: No  frontal/maxillary tenderness ENT/Mouth: EAC's clear, TM's nl w/o erythema, bulging. Nares clear w/o erythema, swelling, exudates. Oropharynx clear without erythema or exudates. Oral hygiene is good. Tongue normal, non obstructing. Hearing intact.  Neck: Supple. Thyroid nl. Car 2+/2+ without bruits, nodes or JVD. Chest: Respirations nl with BS clear & equal w/o rales, rhonchi, wheezing or stridor.  Cor: Heart sounds normal w/ regular rate and rhythm without sig. murmurs, gallops, clicks, or rubs. Peripheral pulses normal and equal  without edema.  Abdomen: Soft & bowel sounds normal. Non-tender w/o guarding, rebound, hernias, masses, or organomegaly.  Lymphatics: Unremarkable.  Musculoskeletal: Full ROM all peripheral extremities, joint stability, 5/5 strength, and normal gait.  Skin: Warm, dry without exposed rashes, lesions or ecchymosis apparent.  Neuro: Cranial nerves intact, reflexes equal bilaterally. Sensory-motor testing grossly intact. Tendon reflexes grossly intact.  Pysch: Alert & oriented x 3.  Insight and judgement nl & appropriate. No ideations.  Assessment and Plan:  1. Essential hypertension  - TSH  2. Hyperlipidemia  - Lipid panel - TSH  3. Diabetes mellitus screening  - Hemoglobin A1c - Insulin, random  4. Vitamin D deficiency  - VITAMIN D 25 Hydroxy   5. Medication management  - CBC with Differential/Platelet - BASIC METABOLIC PANEL WITH GFR - Hepatic function panel - Magnesium   Recommended regular exercise, BP monitoring, weight control, and discussed med and SE's. Recommended labs to assess and monitor clinical status. Further disposition pending results of labs. Over 30 minutes of exam, counseling, chart review was performed

## 2015-09-09 LAB — VITAMIN D 25 HYDROXY (VIT D DEFICIENCY, FRACTURES): Vit D, 25-Hydroxy: 35 ng/mL (ref 30–100)

## 2015-09-09 LAB — INSULIN, RANDOM: Insulin: 6.3 u[IU]/mL (ref 2.0–19.6)

## 2015-12-05 ENCOUNTER — Other Ambulatory Visit: Payer: Self-pay | Admitting: Internal Medicine

## 2015-12-24 ENCOUNTER — Encounter: Payer: Self-pay | Admitting: Physician Assistant

## 2016-01-15 ENCOUNTER — Encounter: Payer: Self-pay | Admitting: Physician Assistant

## 2016-01-15 ENCOUNTER — Ambulatory Visit (INDEPENDENT_AMBULATORY_CARE_PROVIDER_SITE_OTHER): Payer: PPO | Admitting: Physician Assistant

## 2016-01-15 VITALS — BP 118/70 | HR 63 | Temp 97.5°F | Resp 16 | Ht 65.0 in | Wt 137.2 lb

## 2016-01-15 DIAGNOSIS — E559 Vitamin D deficiency, unspecified: Secondary | ICD-10-CM

## 2016-01-15 DIAGNOSIS — C50911 Malignant neoplasm of unspecified site of right female breast: Secondary | ICD-10-CM | POA: Diagnosis not present

## 2016-01-15 DIAGNOSIS — Z79899 Other long term (current) drug therapy: Secondary | ICD-10-CM

## 2016-01-15 DIAGNOSIS — Z136 Encounter for screening for cardiovascular disorders: Secondary | ICD-10-CM | POA: Diagnosis not present

## 2016-01-15 DIAGNOSIS — E785 Hyperlipidemia, unspecified: Secondary | ICD-10-CM

## 2016-01-15 DIAGNOSIS — F325 Major depressive disorder, single episode, in full remission: Secondary | ICD-10-CM

## 2016-01-15 DIAGNOSIS — T7840XD Allergy, unspecified, subsequent encounter: Secondary | ICD-10-CM

## 2016-01-15 DIAGNOSIS — M8580 Other specified disorders of bone density and structure, unspecified site: Secondary | ICD-10-CM | POA: Diagnosis not present

## 2016-01-15 DIAGNOSIS — I10 Essential (primary) hypertension: Secondary | ICD-10-CM | POA: Diagnosis not present

## 2016-01-15 DIAGNOSIS — R7309 Other abnormal glucose: Secondary | ICD-10-CM

## 2016-01-15 DIAGNOSIS — R6889 Other general symptoms and signs: Secondary | ICD-10-CM | POA: Diagnosis not present

## 2016-01-15 DIAGNOSIS — Z0001 Encounter for general adult medical examination with abnormal findings: Secondary | ICD-10-CM

## 2016-01-15 LAB — HEPATIC FUNCTION PANEL
ALT: 28 U/L (ref 6–29)
AST: 31 U/L (ref 10–35)
Albumin: 4.4 g/dL (ref 3.6–5.1)
Alkaline Phosphatase: 82 U/L (ref 33–130)
Bilirubin, Direct: 0.2 mg/dL (ref <=0.2)
Indirect Bilirubin: 0.7 mg/dL (ref 0.2–1.2)
Total Bilirubin: 0.9 mg/dL (ref 0.2–1.2)
Total Protein: 7.2 g/dL (ref 6.1–8.1)

## 2016-01-15 LAB — BASIC METABOLIC PANEL WITH GFR
BUN: 16 mg/dL (ref 7–25)
CO2: 24 mmol/L (ref 20–31)
Calcium: 9.4 mg/dL (ref 8.6–10.4)
Chloride: 104 mmol/L (ref 98–110)
Creat: 0.91 mg/dL (ref 0.60–0.93)
GFR, Est African American: 71 mL/min (ref >=60)
GFR, Est Non African American: 61 mL/min (ref >=60)
Glucose, Bld: 93 mg/dL (ref 65–99)
Potassium: 4.5 mmol/L (ref 3.5–5.3)
Sodium: 140 mmol/L (ref 135–146)

## 2016-01-15 LAB — LIPID PANEL
Cholesterol: 218 mg/dL — ABNORMAL HIGH (ref 125–200)
HDL: 50 mg/dL (ref >=46)
LDL Cholesterol: 127 mg/dL (ref <130)
Total CHOL/HDL Ratio: 4.4 {ratio} (ref <=5.0)
Triglycerides: 205 mg/dL — ABNORMAL HIGH (ref <150)
VLDL: 41 mg/dL — ABNORMAL HIGH (ref <30)

## 2016-01-15 LAB — CBC WITH DIFFERENTIAL/PLATELET
Basophils Absolute: 0 cells/uL (ref 0–200)
Basophils Relative: 0 %
Eosinophils Absolute: 192 cells/uL (ref 15–500)
Eosinophils Relative: 3 %
HCT: 42.5 % (ref 35.0–45.0)
Hemoglobin: 14 g/dL (ref 11.7–15.5)
Lymphocytes Relative: 28 %
Lymphs Abs: 1792 {cells}/uL (ref 850–3900)
MCH: 28.3 pg (ref 27.0–33.0)
MCHC: 32.9 g/dL (ref 32.0–36.0)
MCV: 86 fL (ref 80.0–100.0)
MPV: 11 fL (ref 7.5–12.5)
Monocytes Absolute: 576 cells/uL (ref 200–950)
Monocytes Relative: 9 %
Neutro Abs: 3840 {cells}/uL (ref 1500–7800)
Neutrophils Relative %: 60 %
Platelets: 273 10*3/uL (ref 140–400)
RBC: 4.94 MIL/uL (ref 3.80–5.10)
RDW: 14.2 % (ref 11.0–15.0)
WBC: 6.4 10*3/uL (ref 3.8–10.8)

## 2016-01-15 LAB — TSH: TSH: 1.39 mIU/L

## 2016-01-15 MED ORDER — TAMOXIFEN CITRATE 20 MG PO TABS: 20.0000 mg | ORAL_TABLET | Freq: Every day | ORAL | Status: DC

## 2016-01-15 MED ORDER — NEOMYCIN-POLYMYXIN-DEXAMETH 3.5-10000-0.1 OP SUSP: 1.0000 [drp] | Freq: Four times a day (QID) | OPHTHALMIC | Status: DC

## 2016-01-15 NOTE — Progress Notes (Signed)
CPE AND 3 MONTH FOLLOW UP  Assessment:    Essential hypertension - - continue medications, DASH diet, exercise and monitor at home. Call if greater than 130/80.  - EKG 12-Lead  Hyperlipidemia -continue medications, check lipids, decrease fatty foods, increase activity.    Vitamin D deficiency Continue supplement  Depression remission   Cancer of right breast Continue close follow ups, will switch to tamoxifen   Osteopenia - will switch evitsa to tamoxifen due to cost, will get DEXA this year   Abnormal sugar Check A1c, decrease sugars  Allergies Eye drops sent in, folllow up eye doctor   Subjective:   Tiffany Vaughn is a 75 y.o. female who presents for complete physical and follow up for HTN, preDM, chol, vitamin D def.   Her blood pressure has been controlled at home, today their BP is BP: 118/70 mmHg She does not workout but she goes to [place] often and walks when she is there. She denies chest pain, shortness of breath, dizziness.  She is on cholesterol medication, lipitor 20 mg daily and denies myalgias. Her cholesterol is at goal. The cholesterol last visit was:   Lab Results  Component Value Date   CHOL 178 09/08/2015   HDL 39* 09/08/2015   LDLCALC 105 09/08/2015   TRIG 168* 09/08/2015   CHOLHDL 4.6 09/08/2015   She was in the preDM range last visit, denies DM polys Lab Results  Component Value Date   HGBA1C 6.0* 09/08/2015   Patient is on Vitamin D supplement.   Lab Results  Component Value Date   VD25OH 35 09/08/2015   She is on Evista for history of previous right breast cancer, she would like to be switched to tamoxifen due to cost.   She has bad allergies with her eyes and requesting a medication., no painful eyes, no vision changes, just itchy, watery eyes.  She did fall mechanically in her garage in March, no LOC, has some right hip pain from it still.   Medication Review Current Outpatient Prescriptions on File Prior to Visit   Medication Sig Dispense Refill  . ASPIRIN PO Take 81 mg by mouth daily.    Marland Kitchen atorvastatin (LIPITOR) 20 MG tablet TAKE 1 TABLET BY MOUTH AT BEDTIME 90 tablet 3  . Cholecalciferol (VITAMIN D PO) Take by mouth daily.    Marland Kitchen FLUZONE HIGH-DOSE 0.5 ML SUSY TO BE ADMINISTERED BY PHARMACIST FOR IMMUNIZATION  0  . MAGNESIUM PO Take by mouth daily.    . metoprolol succinate (TOPROL-XL) 50 MG 24 hr tablet TAKE 1 TABLET BY MOUTH EVERY DAY 90 tablet 3  . Omega-3 Fatty Acids (FISH OIL PO) Take by mouth daily.    . raloxifene (EVISTA) 60 MG tablet TAKE 1 TABLET BY MOUTH EVERY DAY 90 tablet 1   No current facility-administered medications on file prior to visit.    Current Problems (verified) Patient Active Problem List   Diagnosis Date Noted  . Diabetes mellitus screening 09/08/2015  . Osteopenia 12/23/2014  . Medication management 10/24/2013  . Hyperlipidemia   . Hypertension   . Vitamin D deficiency   . Depression   . Allergy   . Cancer of right breast ([place])     Screening Tests Immunization History  Administered Date(s) Administered  . DT 12/23/2014  . Pneumococcal Conjugate-13 12/17/2013  . Pneumococcal Polysaccharide-23 10/23/2009  . Td 04/15/2005  . Zoster 11/19/2010   Preventative care: Last colonoscopy: 05/2014  Dr. Henrene Pastor Last mammogram: 02/2015 Last pap smear/pelvic exam: 2009 remote  DEXA: 01/2014, t -1.6, osteopenia Cardiolite 2011 negative EF 65%   Prior vaccinations: TD : 2016  Influenza: 2016 Pneumococcal: 2011 Prevnar 13: 2015 Shingles/Zostavax: 2012  Names of Other Physician/Practitioners you currently use: 1. [facility]- here for primary care 2. Dr. Almyra Deforest, eye doctor, last visit seeing 11/2013, has appointment in August 3. Dr. Orlando Penner, dentist, last visit q 6 months Patient Care Team: Unk Pinto, MD as PCP - General (Internal Medicine) Irene Shipper, MD as Consulting Physician (Gastroenterology) Janit Pagan, MD as  Referring Physician (Cardiology)  Allergies Allergies  Allergen Reactions  . Codeine     SURGICAL HISTORY She  has past surgical history that includes Tonsillectomy and adenoidectomy; Abdominal hysterectomy; and Breast lumpectomy (Right). FAMILY HISTORY Her family history includes Heart attack in her father and mother; Leukemia in her brother. SOCIAL HISTORY She  reports that she has never smoked. She does not have any smokeless tobacco history on file. She reports that she does not drink alcohol or use illicit drugs.  Review of Systems  Constitutional: Negative.   HENT: Negative.   Eyes: Positive for redness. Negative for blurred vision, double vision, photophobia, pain and discharge.  Respiratory: Negative.   Cardiovascular: Negative.   Gastrointestinal: Negative.   Genitourinary: Negative.   Musculoskeletal: Negative.   Skin: Negative.   Neurological: Negative.   Endo/Heme/Allergies: Negative.   Psychiatric/Behavioral: Negative.      Objective:     Blood pressure 118/70, pulse 63, temperature 97.5 F (36.4 C), temperature source Temporal, resp. rate 16, height 5\' 5"  (1.651 m), weight 137 lb 3.2 oz (62.234 kg), SpO2 95 %. Body mass index is 22.83 kg/(m^2).  General appearance: alert, no distress, WD/WN,  female HEENT: normocephalic, sclerae anicteric, TMs pearly, nares patent, no discharge or erythema, pharynx normal Oral cavity: MMM, no lesions Neck: supple, no lymphadenopathy, no thyromegaly, no masses Heart: RRR, normal S1, S2, no murmurs Lungs: CTA bilaterally, no wheezes, rhonchi, or rales Abdomen: +bs, soft, non tender, non distended, no masses, no hepatomegaly, no splenomegaly Musculoskeletal: nontender, no swelling, no obvious deformity Extremities: no edema, no cyanosis, no clubbing Pulses: 2+ symmetric, upper and lower extremities, normal cap refill Neurological: alert, oriented x 3, CN2-12 intact, strength normal upper extremities and lower extremities,  sensation normal throughout, DTRs 2+ throughout, no cerebellar signs, gait normal Psychiatric: normal affect, behavior normal, pleasant   EKG ENL, PRWP, No ST changes

## 2016-01-15 NOTE — Patient Instructions (Signed)
Your A1C is a measure of your sugar over the past 3 months and is not affected by what you have eaten over the past few days. Diabetes increases your chances of stroke and heart attack over 300 % and is the leading cause of blindness and kidney failure in the [place]. Please make sure you decrease bad carbs like white bread, white rice, potatoes, corn, soft drinks, pasta, cereals, refined sugars, sweet tea, dried fruits, and fruit juice. Good carbs are okay to eat in moderation like sweet potatoes, brown rice, whole grain pasta/bread, most fruit (except dried fruit) and you can eat as many veggies as you want.   Greater than 6.5 is considered diabetic. Between 6.4 and 5.7 is prediabetic If your A1C is less than 5.7 you are NOT diabetic.  Your A1C last visit was  Lab Results  Component Value Date   HGBA1C 6.0* 09/08/2015    Targets for Glucose Readings: Time of Check Target for patients WITHOUT Diabetes Target for DIABETICS  Before Meals Less than 100  less than 150  Two hours after meals Less than 200  Less than 250      Bad carbs also include fruit juice, alcohol, and sweet tea. These are empty calories that do not signal to your brain that you are full.   Please remember the good carbs are still carbs which convert into sugar. So please measure them out no more than 1/2-1 cup of rice, oatmeal, pasta, and beans  Veggies are however free foods! Pile them on.   Not all fruit is created equal. Please see the list below, the fruit at the bottom is higher in sugars than the fruit at the top. Please avoid all dried fruits.

## 2016-01-16 LAB — URINALYSIS, ROUTINE W REFLEX MICROSCOPIC
Bilirubin Urine: NEGATIVE
Glucose, UA: NEGATIVE
Hgb urine dipstick: NEGATIVE
Ketones, ur: NEGATIVE
Leukocytes, UA: NEGATIVE
Nitrite: NEGATIVE
Protein, ur: NEGATIVE
Specific Gravity, Urine: 1.019 (ref 1.001–1.035)
pH: 6 (ref 5.0–8.0)

## 2016-01-16 LAB — MICROALBUMIN / CREATININE URINE RATIO
Creatinine, Urine: 168 mg/dL (ref 20–320)
Microalb Creat Ratio: 2 mcg/mg creat (ref <30)
Microalb, Ur: 0.4 mg/dL

## 2016-01-16 LAB — HEMOGLOBIN A1C
Hgb A1c MFr Bld: 6.2 % — ABNORMAL HIGH (ref <5.7)
Mean Plasma Glucose: 131 mg/dL

## 2016-05-31 ENCOUNTER — Ambulatory Visit: Payer: Self-pay | Admitting: Physician Assistant

## 2016-07-03 ENCOUNTER — Encounter: Payer: Self-pay | Admitting: *Deleted

## 2016-07-06 ENCOUNTER — Ambulatory Visit: Payer: Self-pay | Admitting: Physician Assistant

## 2016-07-06 DIAGNOSIS — R69 Illness, unspecified: Secondary | ICD-10-CM | POA: Diagnosis not present

## 2016-08-04 ENCOUNTER — Ambulatory Visit (INDEPENDENT_AMBULATORY_CARE_PROVIDER_SITE_OTHER): Payer: Medicare HMO | Admitting: Physician Assistant

## 2016-08-04 ENCOUNTER — Encounter: Payer: Self-pay | Admitting: Physician Assistant

## 2016-08-04 VITALS — BP 124/84 | HR 71 | Temp 97.3°F | Resp 14 | Ht 65.0 in | Wt 126.6 lb

## 2016-08-04 DIAGNOSIS — Z0001 Encounter for general adult medical examination with abnormal findings: Secondary | ICD-10-CM

## 2016-08-04 DIAGNOSIS — I10 Essential (primary) hypertension: Secondary | ICD-10-CM

## 2016-08-04 DIAGNOSIS — M8580 Other specified disorders of bone density and structure, unspecified site: Secondary | ICD-10-CM

## 2016-08-04 DIAGNOSIS — F325 Major depressive disorder, single episode, in full remission: Secondary | ICD-10-CM | POA: Diagnosis not present

## 2016-08-04 DIAGNOSIS — T7840XD Allergy, unspecified, subsequent encounter: Secondary | ICD-10-CM

## 2016-08-04 DIAGNOSIS — E348 Other specified endocrine disorders: Secondary | ICD-10-CM

## 2016-08-04 DIAGNOSIS — E785 Hyperlipidemia, unspecified: Secondary | ICD-10-CM | POA: Diagnosis not present

## 2016-08-04 DIAGNOSIS — R7309 Other abnormal glucose: Secondary | ICD-10-CM | POA: Diagnosis not present

## 2016-08-04 DIAGNOSIS — R6889 Other general symptoms and signs: Secondary | ICD-10-CM

## 2016-08-04 DIAGNOSIS — R69 Illness, unspecified: Secondary | ICD-10-CM | POA: Diagnosis not present

## 2016-08-04 DIAGNOSIS — E559 Vitamin D deficiency, unspecified: Secondary | ICD-10-CM | POA: Diagnosis not present

## 2016-08-04 DIAGNOSIS — C50911 Malignant neoplasm of unspecified site of right female breast: Secondary | ICD-10-CM | POA: Diagnosis not present

## 2016-08-04 DIAGNOSIS — Z0000 Encounter for general adult medical examination without abnormal findings: Secondary | ICD-10-CM

## 2016-08-04 DIAGNOSIS — Z79899 Other long term (current) drug therapy: Secondary | ICD-10-CM

## 2016-08-04 LAB — BASIC METABOLIC PANEL WITH GFR
BUN: 19 mg/dL (ref 7–25)
CO2: 27 mmol/L (ref 20–31)
Calcium: 9.6 mg/dL (ref 8.6–10.4)
Chloride: 105 mmol/L (ref 98–110)
Creat: 0.99 mg/dL — ABNORMAL HIGH (ref 0.60–0.93)
GFR, Est African American: 64 mL/min (ref >=60)
GFR, Est Non African American: 55 mL/min — ABNORMAL LOW (ref >=60)
Glucose, Bld: 98 mg/dL (ref 65–99)
Potassium: 4.4 mmol/L (ref 3.5–5.3)
Sodium: 141 mmol/L (ref 135–146)

## 2016-08-04 LAB — CBC WITH DIFFERENTIAL/PLATELET
Basophils Absolute: 0 cells/uL (ref 0–200)
Basophils Relative: 0 %
Eosinophils Absolute: 120 cells/uL (ref 15–500)
Eosinophils Relative: 2 %
HCT: 40.2 % (ref 35.0–45.0)
Hemoglobin: 13.1 g/dL (ref 11.7–15.5)
Lymphocytes Relative: 36 %
Lymphs Abs: 2160 cells/uL (ref 850–3900)
MCH: 28.5 pg (ref 27.0–33.0)
MCHC: 32.6 g/dL (ref 32.0–36.0)
MCV: 87.4 fL (ref 80.0–100.0)
MPV: 11.4 fL (ref 7.5–12.5)
Monocytes Absolute: 600 cells/uL (ref 200–950)
Monocytes Relative: 10 %
Neutro Abs: 3120 cells/uL (ref 1500–7800)
Neutrophils Relative %: 52 %
Platelets: 283 10*3/uL (ref 140–400)
RBC: 4.6 MIL/uL (ref 3.80–5.10)
RDW: 13.8 % (ref 11.0–15.0)
WBC: 6 10*3/uL (ref 3.8–10.8)

## 2016-08-04 LAB — TSH: TSH: 1.71 m[IU]/L

## 2016-08-04 LAB — LIPID PANEL
Cholesterol: 204 mg/dL — ABNORMAL HIGH (ref <200)
HDL: 54 mg/dL (ref >50)
LDL Cholesterol: 126 mg/dL — ABNORMAL HIGH (ref <100)
Total CHOL/HDL Ratio: 3.8 {ratio} (ref <5.0)
Triglycerides: 121 mg/dL (ref <150)
VLDL: 24 mg/dL (ref <30)

## 2016-08-04 LAB — HEPATIC FUNCTION PANEL
ALT: 11 U/L (ref 6–29)
AST: 21 U/L (ref 10–35)
Albumin: 4.3 g/dL (ref 3.6–5.1)
Alkaline Phosphatase: 57 U/L (ref 33–130)
Bilirubin, Direct: 0.1 mg/dL (ref <=0.2)
Indirect Bilirubin: 0.5 mg/dL (ref 0.2–1.2)
Total Bilirubin: 0.6 mg/dL (ref 0.2–1.2)
Total Protein: 7 g/dL (ref 6.1–8.1)

## 2016-08-04 NOTE — Progress Notes (Signed)
MEDICARE ANNUAL WELLNESS VISIT AND 3 MONTH FOLLOW UP  Assessment:   Essential hypertension - - continue medications, DASH diet, exercise and monitor at home. Call if greater than 130/80.  - EKG 12-Lead   Hyperlipidemia -continue medications, check lipids, decrease fatty foods, increase activity.   Vitamin D deficiency Continue supplement   Depression, remission remission   Cancer of right breast Continue close follow ups  Osteopenia Stop evista for now, get DEXA dec  Other abnormal glucose -     Hemoglobin A1c Discussed general issues about diabetes pathophysiology and management., Educational material distributed., Suggested low cholesterol diet., Encouraged aerobic exercise., Discussed foot care., Reminded to get yearly retinal exam.   Estradiol deficiency -     DG Bone Density; Future  Encounter for Medicare annual wellness exam   Future Appointments Date Time Provider [place]  01/18/2017 10:00 AM Vicie Mutters, PA-C [id number] None    Plan:   During the course of the visit the patient was educated and counseled about appropriate screening and preventive services including:    Pneumococcal vaccine   Influenza vaccine  Td vaccine  Screening electrocardiogram  Screening mammography  Bone densitometry screening  Colorectal cancer screening  Diabetes screening  Glaucoma screening  Nutrition counseling   Advanced directives: given information/requested  Subjective:   Tiffany Vaughn is a 76 y.o. female who presents for Medicare Annual Wellness Visit and follow up for HTN, chol.    Her blood pressure has been controlled at home, today their BP is BP: 124/84 She does not workout but she goes to [place] often and walks when she is there, she is on toprol for HA. She denies chest pain, shortness of breath, dizziness.  She is on cholesterol medication, lipitor 20 mg daily and denies myalgias. Her cholesterol is at goal. The cholesterol  last visit was:   Lab Results  Component Value Date   CHOL 218 (H) 01/15/2016   HDL 50 01/15/2016   LDLCALC 127 01/15/2016   TRIG 205 (H) 01/15/2016   CHOLHDL 4.4 01/15/2016   Patient is on Vitamin D supplement.   Lab Results  Component Value Date   HGBA1C 6.2 (H) 01/15/2016   She is on Evista for history of previous right breast cancer 20 years ago, at this time we may get off of it, recheck MGM and DEXA in dec and decide if it is still needed 20 + years since her breast cancer. BMI is Body mass index is 21.07 kg/m., she is working on diet and exercise. Wt Readings from Last 3 Encounters:  08/04/16 126 lb 9.6 oz (57.4 kg)  01/15/16 137 lb 3.2 oz (62.2 kg)  09/08/15 139 lb 6.4 oz (63.2 kg)    Medication Review Current Outpatient Prescriptions on File Prior to Visit  Medication Sig Dispense Refill  . ASPIRIN PO Take 81 mg by mouth daily.    Marland Kitchen atorvastatin (LIPITOR) 20 MG tablet TAKE 1 TABLET BY MOUTH AT BEDTIME 90 tablet 3  . Cholecalciferol (VITAMIN D PO) Take by mouth daily.    Marland Kitchen FLUZONE HIGH-DOSE 0.5 ML SUSY TO BE ADMINISTERED BY PHARMACIST FOR IMMUNIZATION  0  . MAGNESIUM PO Take by mouth daily.    . metoprolol succinate (TOPROL-XL) 50 MG 24 hr tablet TAKE 1 TABLET BY MOUTH EVERY DAY 90 tablet 3  . neomycin-polymyxin b-dexamethasone (MAXITROL) 3.5-10000-0.1 SUSP Place 1 drop into both eyes 4 (four) times daily. 5 mL 1  . Omega-3 Fatty Acids (FISH OIL PO) Take by mouth daily.  No current facility-administered medications on file prior to visit.     Current Problems (verified) Patient Active Problem List   Diagnosis Date Noted  . Other abnormal glucose 01/15/2016  . Diabetes mellitus screening 09/08/2015  . Osteopenia 12/23/2014  . Medication management 10/24/2013  . Hyperlipidemia   . Hypertension   . Vitamin D deficiency   . Depression, major, in remission ([place])   . Allergy   . Cancer of right breast ([place])     Screening Tests Immunization History   Administered Date(s) Administered  . DT 12/23/2014  . Influenza-Unspecified 04/02/2016  . Pneumococcal Conjugate-13 12/17/2013  . Pneumococcal Polysaccharide-23 10/23/2009  . Td 04/15/2005  . Zoster 11/19/2010   Preventative care: Last colonoscopy: 05/2014  Dr. Henrene Pastor Last mammogram: 05/2016 Last pap smear/pelvic exam: 2009 remote DEXA: 01/2014, t -1.6, osteopenia- due will get with [facility] Cardiolite 2011 negative EF 65%   Prior vaccinations: TD : 2016  Influenza: 2017 Pneumococcal: 2011 Prevnar 13: 2015 Shingles/Zostavax: 2012  Names of Other Physician/Practitioners you currently use: 1. [facility]- here for primary care 2. Dr. Almyra Deforest, eye doctor, last visit seeing 11/2015 3. Dr. Orlando Penner, dentist, last visit q 6 months 2018 Patient Care Team: Unk Pinto, MD as PCP - General (Internal Medicine) Irene Shipper, MD as Consulting Physician (Gastroenterology) Janit Pagan, MD as Referring Physician (Cardiology)  Allergies Allergies  Allergen Reactions  . Codeine    SURGICAL HISTORY She  has a past surgical history that includes Tonsillectomy and adenoidectomy; Abdominal hysterectomy; and Breast lumpectomy (Right). FAMILY HISTORY Her family history includes Heart attack in her father and mother; Leukemia in her brother. SOCIAL HISTORY She  reports that she has never smoked. She has never used smokeless tobacco. She reports that she does not drink alcohol or use drugs.  MEDICARE WELLNESS OBJECTIVES: Physical activity: no regular exercise at this time Depression/mood screen:   Depression screen [facility] 2/9 08/04/2016  Decreased Interest 0  Down, Depressed, Hopeless 0  PHQ - 2 Score 0   Hearing: normal Visual acuity: normal,  does perform annual eye exam  ADLs:  In your present state of health, do you have any difficulty performing the following activities: 08/04/2016 09/08/2015  Hearing? N N  Vision? N N  Difficulty concentrating or  making decisions? N N  Walking or climbing stairs? N N  Dressing or bathing? N N  Doing errands, shopping? N N  Some recent data might be hidden    Fall risk: Low Risk Cognitive Testing  Alert? Yes  Normal Appearance?Yes  Oriented to person? Yes  Place? Yes   Time? Yes  Recall of three objects?  Yes  Can perform simple calculations? Yes  Displays appropriate judgment?Yes  Can read the correct time from a watch face?Yes  EOL planning: Does Patient Have a Medical Advance Directive?: Yes Type of Advance Directive: Healthcare Power of Attorney, Living will Copy of [place] in Chart?: No - copy requested    Objective:     Today's Vitals   08/04/16 1121  BP: 124/84  Pulse: 71  Resp: 14  Temp: 97.3 F (36.3 C)  SpO2: 97%  Weight: 126 lb 9.6 oz (57.4 kg)  Height: 5\' 5"  (1.651 m)  PainSc: 7   PainLoc: Back   Body mass index is 21.07 kg/m.  General appearance: alert, no distress, WD/WN,  female HEENT: normocephalic, sclerae anicteric, TMs pearly, nares patent, no discharge or erythema, pharynx normal Oral cavity: MMM, no lesions Neck:  supple, no lymphadenopathy, no thyromegaly, no masses Heart: RRR, normal S1, S2, no murmurs Lungs: CTA bilaterally, no wheezes, rhonchi, or rales Abdomen: +bs, soft, non tender, non distended, no masses, no hepatomegaly, no splenomegaly Musculoskeletal: nontender, no swelling, no obvious deformity Extremities: no edema, no cyanosis, no clubbing Pulses: 2+ symmetric, upper and lower extremities, normal cap refill Neurological: alert, oriented x 3, CN2-12 intact, strength normal upper extremities and lower extremities, sensation normal throughout, DTRs 2+ throughout, no cerebellar signs, gait normal Psychiatric: normal affect, behavior normal, pleasant  Breast: defer Gyn: defer  Rectal: defer  Medicare Attestation I have personally reviewed: The patient's medical and social history Their use of alcohol, tobacco or  illicit drugs Their current medications and supplements The patient's functional ability including ADLs,fall risks, home safety risks, cognitive, and hearing and visual impairment Diet and physical activities Evidence for depression or mood disorders  The patient's weight, height, BMI, and visual acuity have been recorded in the chart.  I have made referrals, counseling, and provided education to the patient based on review of the above and I have provided the patient with a written personalized care plan for preventive services.     Vicie Mutters, PA-C   08/04/2016

## 2016-08-04 NOTE — Patient Instructions (Addendum)
Can stop the evista Will get dexa with MGM in Dec  toprol start by cutting in half for 1 month Then can stop Monitor BP during this time, want below 140/90  If you start to have headaches again get back on medication  Can try melatonin 5mg -15 mg at night for sleep, can also do benadryl 25-50mg  at night for sleep.  If this does not help we can try prescription medication.  Also here is some information about good sleep hygiene.   Insomnia Insomnia is frequent trouble falling and/or staying asleep. Insomnia can be a long term problem or a short term problem. Both are common. Insomnia can be a short term problem when the wakefulness is related to a certain stress or worry. Long term insomnia is often related to ongoing stress during waking hours and/or poor sleeping habits. Overtime, sleep deprivation itself can make the problem worse. Every little thing feels more severe because you are overtired and your ability to cope is decreased. CAUSES   Stress, anxiety, and depression.  Poor sleeping habits.  Distractions such as TV in the bedroom.  Naps close to bedtime.  Engaging in emotionally charged conversations before bed.  Technical reading before sleep.  Alcohol and other sedatives. They may make the problem worse. They can hurt normal sleep patterns and normal dream activity.  Stimulants such as caffeine for several hours prior to bedtime.  Pain syndromes and shortness of breath can cause insomnia.  Exercise late at night.  Changing time zones may cause sleeping problems (jet lag). It is sometimes helpful to have someone observe your sleeping patterns. They should look for periods of not breathing during the night (sleep apnea). They should also look to see how long those periods last. If you live alone or observers are uncertain, you can also be observed at a sleep clinic where your sleep patterns will be professionally monitored. Sleep apnea requires a checkup and treatment.  Give your caregivers your medical history. Give your caregivers observations your family has made about your sleep.  SYMPTOMS   Not feeling rested in the morning.  Anxiety and restlessness at bedtime.  Difficulty falling and staying asleep. TREATMENT   Your caregiver may prescribe treatment for an underlying medical disorders. Your caregiver can give advice or help if you are using alcohol or other drugs for self-medication. Treatment of underlying problems will usually eliminate insomnia problems.  Medications can be prescribed for short time use. They are generally not recommended for lengthy use.  Over-the-counter sleep medicines are not recommended for lengthy use. They can be habit forming.  You can promote easier sleeping by making lifestyle changes such as:  Using relaxation techniques that help with breathing and reduce muscle tension.  Exercising earlier in the day.  Changing your diet and the time of your last meal. No night time snacks.  Establish a regular time to go to bed.  Counseling can help with stressful problems and worry.  Soothing music and white noise may be helpful if there are background noises you cannot remove.  Stop tedious detailed work at least one hour before bedtime. HOME CARE INSTRUCTIONS   Keep a diary. Inform your caregiver about your progress. This includes any medication side effects. See your caregiver regularly. Take note of:  Times when you are asleep.  Times when you are awake during the night.  The quality of your sleep.  How you feel the next day. This information will help your caregiver care for you.  Get out  of bed if you are still awake after 15 minutes. Read or do some quiet activity. Keep the lights down. Wait until you feel sleepy and go back to bed.  Keep regular sleeping and waking hours. Avoid naps.  Exercise regularly.  Avoid distractions at bedtime. Distractions include watching television or engaging in any  intense or detailed activity like attempting to balance the household checkbook.  Develop a bedtime ritual. Keep a familiar routine of bathing, brushing your teeth, climbing into bed at the same time each night, listening to soothing music. Routines increase the success of falling to sleep faster.  Use relaxation techniques. This can be using breathing and muscle tension release routines. It can also include visualizing peaceful scenes. You can also help control troubling or intruding thoughts by keeping your mind occupied with boring or repetitive thoughts like the old concept of counting sheep. You can make it more creative like imagining planting one beautiful flower after another in your backyard garden.  During your day, work to eliminate stress. When this is not possible use some of the previous suggestions to help reduce the anxiety that accompanies stressful situations. MAKE SURE YOU:   Understand these instructions.  Will watch your condition.  Will get help right away if you are not doing well or get worse. Document Released: 06/11/2000 Document Revised: 09/06/2011 Document Reviewed: 07/12/2007 [facility] Patient Information 2015 [place], [place]. This information is not intended to replace advice given to you by your health care provider. Make sure you discuss any questions you have with your health care provider.

## 2016-08-05 LAB — HEMOGLOBIN A1C
Hgb A1c MFr Bld: 5.8 % — ABNORMAL HIGH (ref <5.7)
Mean Plasma Glucose: 120 mg/dL

## 2016-08-05 NOTE — Progress Notes (Signed)
Pt aware of lab results & voiced understanding of those results. Per pt request lab results with a lab letter was mailed out to pt.

## 2016-08-05 NOTE — Progress Notes (Signed)
LVM for pt to return office call for LAB results.

## 2016-11-03 DIAGNOSIS — E7800 Pure hypercholesterolemia, unspecified: Secondary | ICD-10-CM | POA: Diagnosis not present

## 2016-11-03 DIAGNOSIS — M810 Age-related osteoporosis without current pathological fracture: Secondary | ICD-10-CM | POA: Diagnosis not present

## 2016-11-03 DIAGNOSIS — Z0000 Encounter for general adult medical examination without abnormal findings: Secondary | ICD-10-CM | POA: Diagnosis not present

## 2016-11-03 DIAGNOSIS — Z79899 Other long term (current) drug therapy: Secondary | ICD-10-CM | POA: Diagnosis not present

## 2016-11-03 DIAGNOSIS — Z853 Personal history of malignant neoplasm of breast: Secondary | ICD-10-CM | POA: Diagnosis not present

## 2016-11-10 ENCOUNTER — Other Ambulatory Visit: Payer: Self-pay | Admitting: Physician Assistant

## 2016-11-10 DIAGNOSIS — M8580 Other specified disorders of bone density and structure, unspecified site: Secondary | ICD-10-CM

## 2016-12-17 ENCOUNTER — Other Ambulatory Visit: Payer: Self-pay | Admitting: Internal Medicine

## 2017-01-17 NOTE — Progress Notes (Deleted)
CPE AND 3 MONTH FOLLOW UP  Assessment:    Essential hypertension - - continue medications, DASH diet, exercise and monitor at home. Call if greater than 130/80.  - EKG 12-Lead  Hyperlipidemia -continue medications, check lipids, decrease fatty foods, increase activity.    Vitamin D deficiency Continue supplement  Depression remission   Cancer of right breast Continue close follow ups, on tamoxifen   Osteopenia - on tamoxifen due to cost   Abnormal sugar Check A1c, decrease sugars  Allergies Continue meds   Subjective:   Tiffany Vaughn is a 76 y.o. female who presents for complete physical and follow up for HTN, preDM, chol, vitamin D def.   Her blood pressure has been controlled at home, today their BP is   She does not workout but she goes to [place] often and walks when she is there. She denies chest pain, shortness of breath, dizziness.  She is on cholesterol medication, lipitor 20 mg daily and denies myalgias. Her cholesterol is at goal. The cholesterol last visit was:   Lab Results  Component Value Date   CHOL 204 (H) 08/04/2016   HDL 54 08/04/2016   LDLCALC 126 (H) 08/04/2016   TRIG 121 08/04/2016   CHOLHDL 3.8 08/04/2016   She was in the preDM range last visit, denies DM polys Lab Results  Component Value Date   HGBA1C 5.8 (H) 08/04/2016   Patient is on Vitamin D supplement.   Lab Results  Component Value Date   VD25OH 35 09/08/2015   She is on tamoxifen for history of previous right breast cancer.  BMI is There is no height or weight on file to calculate BMI., she is working on diet and exercise. Wt Readings from Last 3 Encounters:  08/04/16 126 lb 9.6 oz (57.4 kg)  01/15/16 137 lb 3.2 oz (62.2 kg)  09/08/15 139 lb 6.4 oz (63.2 kg)    Medication Review Current Outpatient Prescriptions on File Prior to Visit  Medication Sig Dispense Refill  . ASPIRIN PO Take 81 mg by mouth daily.    Marland Kitchen atorvastatin (LIPITOR) 20 MG tablet TAKE 1 TABLET BY  MOUTH AT BEDTIME 90 tablet 3  . Cholecalciferol (VITAMIN D PO) Take by mouth daily.    Marland Kitchen FLUZONE HIGH-DOSE 0.5 ML SUSY TO BE ADMINISTERED BY PHARMACIST FOR IMMUNIZATION  0  . MAGNESIUM PO Take by mouth daily.    . metoprolol succinate (TOPROL-XL) 50 MG 24 hr tablet TAKE 1 TABLET BY MOUTH EVERY DAY 90 tablet 3  . neomycin-polymyxin b-dexamethasone (MAXITROL) 3.5-10000-0.1 SUSP Place 1 drop into both eyes 4 (four) times daily. 5 mL 1  . Omega-3 Fatty Acids (FISH OIL PO) Take by mouth daily.     No current facility-administered medications on file prior to visit.     Current Problems (verified) Patient Active Problem List   Diagnosis Date Noted  . Other abnormal glucose 01/15/2016  . Osteopenia 12/23/2014  . Medication management 10/24/2013  . Hyperlipidemia   . Hypertension   . Vitamin D deficiency   . Depression, major, in remission ([place])   . Allergy   . Cancer of right breast ([place])     Screening Tests Immunization History  Administered Date(s) Administered  . DT 12/23/2014  . Influenza-Unspecified 04/02/2016  . Pneumococcal Conjugate-13 12/17/2013  . Pneumococcal Polysaccharide-23 10/23/2009  . Td 04/15/2005  . Zoster 11/19/2010   Preventative care: Last colonoscopy: 05/2014  Dr. Henrene Pastor Last mammogram: 02/2015 Last pap smear/pelvic exam: 2009 remote DEXA: 01/2014, t -1.6,  osteopenia Cardiolite 2011 negative EF 65%   Prior vaccinations: TD : 2016  Influenza: 2016 Pneumococcal: 2011 Prevnar 13: 2015 Shingles/Zostavax: 2012  Names of Other Physician/Practitioners you currently use: 1. [facility]- here for primary care 2. Dr. Almyra Deforest, eye doctor, last visit seeing 11/2013, has appointment in August 3. Dr. Orlando Penner, dentist, last visit q 6 months Patient Care Team: Unk Pinto, MD as PCP - General (Internal Medicine) Irene Shipper, MD as Consulting Physician (Gastroenterology) Janit Pagan, MD as Referring Physician  (Cardiology)  Allergies Allergies  Allergen Reactions  . Codeine     SURGICAL HISTORY She  has a past surgical history that includes Tonsillectomy and adenoidectomy; Abdominal hysterectomy; and Breast lumpectomy (Right). FAMILY HISTORY Her family history includes Heart attack in her father and mother; Leukemia in her brother. SOCIAL HISTORY She  reports that she has never smoked. She has never used smokeless tobacco. She reports that she does not drink alcohol or use drugs.  Review of Systems  Constitutional: Negative.   HENT: Negative.   Eyes: Positive for redness. Negative for blurred vision, double vision, photophobia, pain and discharge.  Respiratory: Negative.   Cardiovascular: Negative.   Gastrointestinal: Negative.   Genitourinary: Negative.   Musculoskeletal: Negative.   Skin: Negative.   Neurological: Negative.   Endo/Heme/Allergies: Negative.   Psychiatric/Behavioral: Negative.      Objective:     There were no vitals taken for this visit. There is no height or weight on file to calculate BMI.  General appearance: alert, no distress, WD/WN,  female HEENT: normocephalic, sclerae anicteric, TMs pearly, nares patent, no discharge or erythema, pharynx normal Oral cavity: MMM, no lesions Neck: supple, no lymphadenopathy, no thyromegaly, no masses Heart: RRR, normal S1, S2, no murmurs Lungs: CTA bilaterally, no wheezes, rhonchi, or rales Abdomen: +bs, soft, non tender, non distended, no masses, no hepatomegaly, no splenomegaly Musculoskeletal: nontender, no swelling, no obvious deformity Extremities: no edema, no cyanosis, no clubbing Pulses: 2+ symmetric, upper and lower extremities, normal cap refill Neurological: alert, oriented x 3, CN2-12 intact, strength normal upper extremities and lower extremities, sensation normal throughout, DTRs 2+ throughout, no cerebellar signs, gait normal Psychiatric: normal affect, behavior normal, pleasant   EKG ENL, PRWP, No ST  changes

## 2017-01-18 ENCOUNTER — Encounter: Payer: Self-pay | Admitting: Physician Assistant

## 2017-02-10 ENCOUNTER — Ambulatory Visit (INDEPENDENT_AMBULATORY_CARE_PROVIDER_SITE_OTHER): Payer: Medicare HMO | Admitting: Internal Medicine

## 2017-02-10 VITALS — BP 138/76 | HR 72 | Temp 97.2°F | Resp 18 | Ht 64.75 in | Wt 130.4 lb

## 2017-02-10 DIAGNOSIS — Z79899 Other long term (current) drug therapy: Secondary | ICD-10-CM

## 2017-02-10 DIAGNOSIS — Z1211 Encounter for screening for malignant neoplasm of colon: Secondary | ICD-10-CM | POA: Diagnosis not present

## 2017-02-10 DIAGNOSIS — Z136 Encounter for screening for cardiovascular disorders: Secondary | ICD-10-CM

## 2017-02-10 DIAGNOSIS — C50911 Malignant neoplasm of unspecified site of right female breast: Secondary | ICD-10-CM

## 2017-02-10 DIAGNOSIS — I10 Essential (primary) hypertension: Secondary | ICD-10-CM | POA: Diagnosis not present

## 2017-02-10 DIAGNOSIS — Z0001 Encounter for general adult medical examination with abnormal findings: Secondary | ICD-10-CM | POA: Diagnosis not present

## 2017-02-10 DIAGNOSIS — E559 Vitamin D deficiency, unspecified: Secondary | ICD-10-CM

## 2017-02-10 DIAGNOSIS — N6310 Unspecified lump in the right breast, unspecified quadrant: Secondary | ICD-10-CM

## 2017-02-10 DIAGNOSIS — E782 Mixed hyperlipidemia: Secondary | ICD-10-CM

## 2017-02-10 DIAGNOSIS — R7303 Prediabetes: Secondary | ICD-10-CM

## 2017-02-10 DIAGNOSIS — N390 Urinary tract infection, site not specified: Secondary | ICD-10-CM | POA: Diagnosis not present

## 2017-02-10 DIAGNOSIS — Z1212 Encounter for screening for malignant neoplasm of rectum: Secondary | ICD-10-CM | POA: Diagnosis not present

## 2017-02-10 DIAGNOSIS — N6315 Unspecified lump in the right breast, overlapping quadrants: Secondary | ICD-10-CM

## 2017-02-10 MED ORDER — PREDNISONE 20 MG PO TABS: ORAL_TABLET | ORAL | 1 refills | Status: DC

## 2017-02-10 NOTE — Progress Notes (Signed)
[facility] Unk Pinto, M.D.      Uvaldo Bristle. Silverio Lay, P.A.-C [facility]                [address] [place], [place] [address] Telephone [phone] Telefax [phone]  Annual Screening/Preventative Visit & Comprehensive Evaluation &  Examination     This very nice 76 y.o.  MWF  presents for a Screening/Preventative Visit & comprehensive evaluation and management of multiple medical co-morbidities.  Patient has been followed for HTN, Prediabetes, Hyperlipidemia and Vitamin D Deficiency. Patient has hx/o Rt breast lumpectomy for Breast Ca (1992).       HTN predates since 2004. Patient's BP has been controlled at home. She had negative  Cardiolite stress tests in 2005 & 2011.  Patient denies any cardiac symptoms as chest pain, palpitations, shortness of breath, dizziness or ankle swelling. Today's BP is at goal - 138/76.      Patient's hyperlipidemia is not controlled with diet and she reports she's been off all of her meds for > 1 month. Patient denies myalgias or other medication SE's when she was on her meds. Last lipids were not at goal: Lab Results  Component Value Date   CHOL 204 (H) 08/04/2016   HDL 54 08/04/2016   LDLCALC 126 (H) 08/04/2016   TRIG 121 08/04/2016   CHOLHDL 3.8 08/04/2016      Patient has prediabetes predating and patient denies reactive hypoglycemic symptoms, visual blurring, diabetic polys, or paresthesias. Last A1c was not at goal: Lab Results  Component Value Date   HGBA1C 5.8 (H) 08/04/2016      Finally, patient has history of Vitamin D Deficiency ("23" in 2008)  and last Vitamin D was still low : Lab Results  Component Value Date   VD25OH 35 09/08/2015   Current Outpatient Prescriptions on File Prior to Visit  Medication Sig  . neomycin-polymyxin b-dexamethasone (MAXITROL) 3.5-10000-0.1 SUSP Place 1 drop into both eyes 4 (four) times daily.  . ASPIRIN PO Take  81 mg by mouth daily.  Marland Kitchen atorvastatin (LIPITOR) 20 MG tablet TAKE 1 TABLET BY MOUTH AT BEDTIME (Patient not taking: Reported on 02/10/2017)  . Cholecalciferol (VITAMIN D PO) Take by mouth daily.  Marland Kitchen FLUZONE HIGH-DOSE 0.5 ML SUSY TO BE ADMINISTERED BY PHARMACIST FOR IMMUNIZATION  . MAGNESIUM PO Take by mouth daily.  . metoprolol succinate (TOPROL-XL) 50 MG 24 hr tablet TAKE 1 TABLET BY MOUTH EVERY DAY (Patient not taking: Reported on 02/10/2017)  . Omega-3 Fatty Acids (FISH OIL PO) Take by mouth daily.   No current facility-administered medications on file prior to visit.    Allergies  Allergen Reactions  . Codeine    Past Medical History:  Diagnosis Date  . Allergy   . Cancer of right breast ([place]) 1992  . Depression   . Hyperlipidemia   . Hypertension   . Vitamin D deficiency    Health Maintenance  Topic Date Due  . INFLUENZA VACCINE  01/26/2017  . TETANUS/TDAP  12/22/2025  . DEXA SCAN  Completed  . PNA vac Low Risk Adult  Completed   Immunization History  Administered Date(s) Administered  . DT 12/23/2014  . Influenza-Unspecified 04/02/2016  . Pneumococcal Conjugate-13 12/17/2013  . Pneumococcal Polysaccharide-23 10/23/2009  . Td 04/15/2005  . Zoster 11/19/2010   Past Surgical History:  Procedure Laterality Date  . ABDOMINAL HYSTERECTOMY  partial  . BREAST LUMPECTOMY Right   . TONSILLECTOMY AND ADENOIDECTOMY     Family History  Problem Relation Age of Onset  . Heart attack Mother   . Heart attack Father   . Leukemia Brother    Social History  Substance Use Topics  . Smoking status: Never Smoker  . Smokeless tobacco: Never Used  . Alcohol use No    ROS Constitutional: Denies fever, chills, weight loss/gain, headaches, insomnia,  night sweats, and change in appetite. Does c/o fatigue. Eyes: Denies redness, blurred vision, diplopia, discharge, itchy, watery eyes.  ENT: Denies discharge, congestion, post nasal drip, epistaxis, sore throat, earache, hearing  loss, dental pain, Tinnitus, Vertigo, Sinus pain, snoring.  Cardio: Denies chest pain, palpitations, irregular heartbeat, syncope, dyspnea, diaphoresis, orthopnea, PND, claudication, edema Respiratory: denies cough, dyspnea, DOE, pleurisy, hoarseness, laryngitis, wheezing.  Gastrointestinal: Denies dysphagia, heartburn, reflux, water brash, pain, cramps, nausea, vomiting, bloating, diarrhea, constipation, hematemesis, melena, hematochezia, jaundice, hemorrhoids Genitourinary: Denies dysuria, frequency, urgency, nocturia, hesitancy, discharge, hematuria, flank pain Breast:  Denies Breast lumps, nipple discharge, bleeding.  Musculoskeletal: Denies arthralgia, myalgia, stiffness, Jt. Swelling, pain, limp, and strain/sprain. Denies falls. Skin: Denies puritis, rash, hives, warts, acne, eczema, changing in skin lesion Neuro: No weakness, tremor, incoordination, spasms, paresthesia, pain Psychiatric: Denies confusion, memory loss, sensory loss. Denies Depression. Endocrine: Denies change in weight, skin, hair change, nocturia, and paresthesia, diabetic polys, visual blurring, hyper / hypo glycemic episodes.  Heme/Lymph: No excessive bleeding, bruising, enlarged lymph nodes.  Physical Exam  BP 138/76   Pulse 72   Temp (!) 97.2 F (36.2 C)   Resp 18   Ht 5' 4.75" (1.645 m)   Wt 130 lb 6.4 oz (59.1 kg)   BMI 21.87 kg/m   General Appearance: Well nourished, well groomed and in no apparent distress.  Eyes: PERRLA, EOMs, conjunctiva no swelling or erythema, normal fundi and vessels. Sinuses: No frontal/maxillary tenderness ENT/Mouth: EACs patent / TMs  nl. Nares clear without erythema, swelling, mucoid exudates. Oral hygiene is good. No erythema, swelling, or exudate. Tongue normal, non-obstructing. Tonsils not swollen or erythematous. Hearing normal.  Neck: Supple, thyroid normal. No bruits, nodes or JVD. Respiratory: Respiratory effort normal.  BS equal and clear bilateral without rales, rhonci,  wheezing or stridor. Cardio: Heart sounds are normal with regular rate and rhythm and no murmurs, rubs or gallops. Peripheral pulses are normal and equal bilaterally without edema. No aortic or femoral bruits. Chest: symmetric with normal excursions and percussion. Breasts: Asymmetric, with thickening in the Rt breast at 9 o/clock. Abdomen: Flat, soft with bowel sounds active. Nontender, no guarding, rebound, hernias, masses, or organomegaly.  Lymphatics: Non tender without lymphadenopathy.  Musculoskeletal: Full ROM all peripheral extremities, joint stability, 5/5 strength, and normal gait. Skin: Warm and dry without rashes, lesions, cyanosis, clubbing or  ecchymosis.  Neuro: Cranial nerves intact, reflexes equal bilaterally. Normal muscle tone, no cerebellar symptoms. Sensation intact.  Pysch: Alert and oriented X 3, normal affect, Insight and Judgment appropriate.   Assessment and Plan  1. Annual Preventative Screening Examination  2. Essential hypertension  - EKG 12-Lead - Urinalysis, Routine w reflex microscopic - Microalbumin / creatinine urine ratio - CBC with Differential/Platelet - BASIC METABOLIC PANEL WITH GFR - Magnesium - TSH  3. Hyperlipidemia, mixed  - Hepatic function panel - Lipid panel - TSH  4. Prediabetes  - Hemoglobin A1c - Insulin, random  5. Vitamin D deficiency  - VITAMIN D 25 Hydroxy   6. Malignant neoplasm of right female  breast, hx  ([place])  - MM Digital Diagnostic Bilat; Future  7. Encounter for colorectal cancer screening  - POC Hemoccult Bld/Stl  8. Screening for ischemic heart disease   9. Medication management  - Urinalysis, Routine w reflex microscopic - Microalbumin / creatinine urine ratio - CBC with Differential/Platelet - BASIC METABOLIC PANEL WITH GFR - Hepatic function panel - Magnesium - Lipid panel - TSH - Hemoglobin A1c - Insulin, random - VITAMIN D 25 Hydroxy   10. Breast lump on right side at 12 o'clock  position  - MM Digital Diagnostic Bilat; Future         Patient was counseled in prudent diet to achieve/maintain BMI less than 25 for weight control, BP monitoring, regular exercise and medications. Discussed med's effects and SE's. Screening labs and tests as requested with regular follow-up as recommended. Over 40 minutes of exam, counseling, chart review and high complex critical decision making was performed.

## 2017-02-10 NOTE — Patient Instructions (Signed)

## 2017-02-11 LAB — HEMOGLOBIN A1C
Hgb A1c MFr Bld: 6 % of total Hgb — ABNORMAL HIGH (ref <5.7)
Mean Plasma Glucose: 126 (calc)
eAG (mmol/L): 7 (calc)

## 2017-02-11 LAB — URINALYSIS, ROUTINE W REFLEX MICROSCOPIC
Bilirubin Urine: NEGATIVE
Glucose, UA: NEGATIVE
Hgb urine dipstick: NEGATIVE
Hyaline Cast: NONE SEEN /LPF
Ketones, ur: NEGATIVE
Nitrite: NEGATIVE
Protein, ur: NEGATIVE
Specific Gravity, Urine: 1.026 (ref 1.001–1.03)
pH: 5.5 (ref 5.0–8.0)

## 2017-02-11 LAB — LIPID PANEL
Cholesterol: 310 mg/dL — ABNORMAL HIGH (ref <200)
HDL: 49 mg/dL — ABNORMAL LOW (ref >50)
LDL Cholesterol (Calc): 217 mg/dL — ABNORMAL HIGH
Non-HDL Cholesterol (Calc): 261 mg/dL (calc) — ABNORMAL HIGH (ref <130)
Total CHOL/HDL Ratio: 6.3 (calc) — ABNORMAL HIGH (ref <5.0)
Triglycerides: 249 mg/dL — ABNORMAL HIGH (ref <150)

## 2017-02-11 LAB — HEPATIC FUNCTION PANEL
AG Ratio: 1.8 (calc) (ref 1.0–2.5)
ALT: 14 U/L (ref 6–29)
AST: 20 U/L (ref 10–35)
Albumin: 4.8 g/dL (ref 3.6–5.1)
Alkaline phosphatase (APISO): 93 U/L (ref 33–130)
Bilirubin, Direct: 0.1 mg/dL (ref 0.0–0.2)
Globulin: 2.6 g/dL (ref 1.9–3.7)
Indirect Bilirubin: 0.5 mg/dL (ref 0.2–1.2)
Total Bilirubin: 0.6 mg/dL (ref 0.2–1.2)
Total Protein: 7.4 g/dL (ref 6.1–8.1)

## 2017-02-11 LAB — CBC WITH DIFFERENTIAL/PLATELET
Basophils Absolute: 28 {cells}/uL (ref 0–200)
Basophils Relative: 0.4 %
Eosinophils Absolute: 178 {cells}/uL (ref 15–500)
Eosinophils Relative: 2.5 %
HCT: 43.5 % (ref 35.0–45.0)
Hemoglobin: 14.4 g/dL (ref 11.7–15.5)
Lymphs Abs: 2350 {cells}/uL (ref 850–3900)
MCH: 28.1 pg (ref 27.0–33.0)
MCHC: 33.1 g/dL (ref 32.0–36.0)
MCV: 85 fL (ref 80.0–100.0)
MPV: 12.1 fL (ref 7.5–12.5)
Monocytes Relative: 9.1 %
Neutro Abs: 3898 {cells}/uL (ref 1500–7800)
Neutrophils Relative %: 54.9 %
Platelets: 316 10*3/uL (ref 140–400)
RBC: 5.12 10*6/uL — ABNORMAL HIGH (ref 3.80–5.10)
RDW: 13.2 % (ref 11.0–15.0)
Total Lymphocyte: 33.1 %
WBC mixed population: 646 cells/uL (ref 200–950)
WBC: 7.1 10*3/uL (ref 3.8–10.8)

## 2017-02-11 LAB — URINE CULTURE
MICRO NUMBER:: 80889109
Result:: NO GROWTH
SPECIMEN QUALITY:: ADEQUATE

## 2017-02-11 LAB — MICROALBUMIN / CREATININE URINE RATIO
Creatinine, Urine: 194 mg/dL (ref 20–320)
Microalb Creat Ratio: 5 ug/mg{creat} (ref <30)
Microalb, Ur: 1 mg/dL

## 2017-02-11 LAB — MAGNESIUM: Magnesium: 2.1 mg/dL (ref 1.5–2.5)

## 2017-02-11 LAB — BASIC METABOLIC PANEL WITH GFR
BUN/Creatinine Ratio: 21 (calc) (ref 6–22)
BUN: 21 mg/dL (ref 7–25)
CO2: 28 mmol/L (ref 20–32)
Calcium: 10.1 mg/dL (ref 8.6–10.4)
Chloride: 103 mmol/L (ref 98–110)
Creat: 0.98 mg/dL — ABNORMAL HIGH (ref 0.60–0.93)
GFR, Est African American: 64 mL/min/{1.73_m2} (ref >=60)
GFR, Est Non African American: 56 mL/min/{1.73_m2} — ABNORMAL LOW (ref >=60)
Glucose, Bld: 91 mg/dL (ref 65–99)
Potassium: 4.5 mmol/L (ref 3.5–5.3)
Sodium: 140 mmol/L (ref 135–146)

## 2017-02-11 LAB — VITAMIN D 25 HYDROXY (VIT D DEFICIENCY, FRACTURES): Vit D, 25-Hydroxy: 47 ng/mL (ref 30–100)

## 2017-02-11 LAB — TSH: TSH: 1.31 mIU/L (ref 0.40–4.50)

## 2017-02-11 LAB — INSULIN, FASTING: Insulin: 4.2 u[IU]/mL (ref 2.0–19.6)

## 2017-02-12 ENCOUNTER — Encounter: Payer: Self-pay | Admitting: Internal Medicine

## 2017-02-17 DIAGNOSIS — N6011 Diffuse cystic mastopathy of right breast: Secondary | ICD-10-CM | POA: Diagnosis not present

## 2017-02-17 DIAGNOSIS — R922 Inconclusive mammogram: Secondary | ICD-10-CM | POA: Diagnosis not present

## 2017-03-14 ENCOUNTER — Encounter: Payer: Self-pay | Admitting: Internal Medicine

## 2017-04-04 ENCOUNTER — Encounter: Payer: Self-pay | Admitting: Internal Medicine

## 2017-04-08 ENCOUNTER — Encounter: Payer: Self-pay | Admitting: Physician Assistant

## 2017-04-13 ENCOUNTER — Encounter: Payer: Self-pay | Admitting: Allergy & Immunology

## 2017-04-13 ENCOUNTER — Ambulatory Visit (INDEPENDENT_AMBULATORY_CARE_PROVIDER_SITE_OTHER): Payer: Medicare HMO | Admitting: Allergy & Immunology

## 2017-04-13 VITALS — BP 116/62 | HR 88 | Temp 98.0°F | Resp 20 | Ht 64.0 in | Wt 135.5 lb

## 2017-04-13 DIAGNOSIS — H1013 Acute atopic conjunctivitis, bilateral: Secondary | ICD-10-CM

## 2017-04-13 DIAGNOSIS — H1045 Other chronic allergic conjunctivitis: Secondary | ICD-10-CM

## 2017-04-13 MED ORDER — OLOPATADINE HCL 0.7 % OP SOLN: 1.0000 [drp] | OPHTHALMIC | 5 refills | Status: DC

## 2017-04-13 NOTE — Progress Notes (Signed)
NEW PATIENT  Date of Service/Encounter:  04/13/17  Referring provider: Unk Pinto, MD   Assessment:   Perennial allergic conjunctivitis (trees, weeds, indoor molds, dust mites, cat and cockroach)    Seasonal Influenza Vaccine: yes   PPSV-23 Vaccine (CDC recommended for patients with persistent asthma 19-78yo): Yes    Plan/Recommendations:   1. Perennial allergic conjunctivitis - Testing today showed: trees, weeds, indoor molds, dust mites, cat and cockroach - Avoidance measures provided. - The dust mites and cat were the largest, therefore these are likely contributing the most to her current symptoms.  - Stop taking: Zyrtec  - Start taking: Pazeo (olopatadine) one drop per eye daily as needed  - Samples of Xyzal and Allegra provided (antihistamines to replace Zyrtec). - Call us and let us know which one works best.  - You can use an extra dose of the antihistamine, if needed, for breakthrough symptoms.  - Consider allergy shots as a means of long-term control. - Allergy shots "re-train" and "reset" the immune system to ignore environmental allergens and decrease the resulting immune response to those allergens (sneezing, itchy watery eyes, runny nose, nasal congestion, etc).    - Allergy shots improve symptoms in 75-85% of patients.  - We can discuss more at the next appointment if the medications are not working for you.  2. Return in about 6 months (around 10/12/2017).   Subjective:   Tiffany Vaughn is a 76 y.o. female presenting today for evaluation of  Chief Complaint  Patient presents with  . Itchy Eye  . Pruritis    Tiffany Vaughn has a history of the following: Patient Active Problem List   Diagnosis Date Noted  . Other abnormal glucose 01/15/2016  . Osteopenia 12/23/2014  . Medication management 10/24/2013  . Hyperlipidemia   . Hypertension   . Vitamin D deficiency   . Depression, major, in remission ([place])   . Allergy   . Cancer of  right breast ([place])     History obtained from: chart review and patient interview.  Tiffany Vaughn was referred by Unk Pinto, MD.     Tiffany Vaughn is a 76 y.o. female presenting for itching eyes with watery discharge and itching around her head which began when she was 76 years old and occurs regardless of the season. She reports sometimes her nose is runny which is also not related to any season. She denies a history of asthma or eczema but does report a time when she experienced hives due to an increase in emotional stress that has resolved. She was seen in the clinic by Dr. Shaune Leeks and reports allergies to environmental allergens for which she received allergy injections for several years (~8 years) which provided some relief of her symptoms.  Since that time, she has tried allergy eye drops, oral antihistamines, cold and warm compresses with no relief. She enjoys working outside in her yard and occasionally will hold one of her niece's dogs but does not have animals in or around her home. 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    There is no family history of allergies, asthma, or dermatitis. Tiffany Vaughn has a history of breast cancer in 1991 with successful treatment and migraine headaches whiche were treated with a beta blocker which she stopped taking several months ago with no further headaches noted. She was last in the hospital for breast cancer treatment. She  is followed by her primary care provider for breast cancer follow up and receives regular mammograms.      Otherwise, there is no history of other atopic diseases, including asthma, drug allergies, food allergies, stinging insect allergies, or urticaria. There is no significant infectious history. Vaccinations are up to date.    Past Medical History: Patient Active Problem List   Diagnosis Date Noted  . Other abnormal glucose 01/15/2016  .  Osteopenia 12/23/2014  . Medication management 10/24/2013  . Hyperlipidemia   . Hypertension   . Vitamin D deficiency   . Depression, major, in remission ([place])   . Allergy   . Cancer of right breast [facility])     Medication List:  Allergies as of 04/13/2017      Reactions   Codeine       Medication List       Accurate as of 04/13/17 11:53 AM. Always use your most recent med list.          ASPIRIN PO Take 81 mg by mouth daily.   atorvastatin 20 MG tablet Commonly known as:  LIPITOR TAKE 1 TABLET BY MOUTH AT BEDTIME   FISH OIL PO Take by mouth daily.   FLUZONE HIGH-DOSE 0.5 ML injection Generic drug:  Influenza vac split quadrivalent PF TO BE ADMINISTERED BY PHARMACIST FOR IMMUNIZATION   MAGNESIUM PO Take by mouth daily.   VITAMIN D PO Take by mouth daily.      Environmental History: Tiffany Vaughn lives in a house that is 36 years ol with no concern for mildew damage. She has hardwood floors in the pos with gas heating and central cooling. There is no concern for roaches, tobacco smoke, fumes, chemicals, or dust in the home. She does not have dust mite covers on her bed.   Past Surgical History: Past Surgical History:  Procedure Laterality Date  . ABDOMINAL HYSTERECTOMY     partial  . BREAST LUMPECTOMY Right   . TONSILLECTOMY AND ADENOIDECTOMY       Family History: Family History  Problem Relation Age of Onset  . Heart attack Mother   . Heart attack Father   . Leukemia Brother      Social History: Tiffany Vaughn lives at home with her second husband. She moved from [place] to [place] 27 years ago when her grandchildren were little to be closer to her family. 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     Review of Systems: a 14-point review of systems is pertinent for what is mentioned in HPI.  Otherwise, all other systems were negative. Constitutional: negative other than that listed in the HPI Eyes:  negative other than that listed in the HPI Ears, nose, mouth, throat, and face: negative other than that listed in the HPI Respiratory: negative other than that listed in the HPI Cardiovascular: negative other than that listed in the HPI Gastrointestinal: negative other than that listed in the HPI Genitourinary: negative other than that listed in the HPI Integument: negative other than that listed in the HPI Hematologic: negative other than that listed in the HPI Musculoskeletal: negative other than that listed in the HPI Neurological: negative other than that listed in the HPI Allergy/Immunologic: negative other than that listed in the HPI    Objective:   Blood pressure 116/62, pulse 88, temperature 98 F (36.7 C), temperature source Oral, resp. rate 20, height 5\' 4"  (1.626 m), weight 135 lb 7.7 oz (61.5 kg),  SpO2 98 %. Body mass index is 23.26 kg/m.   Physical Exam:  General: Alert, interactive, in no acute distress. Eyes: No conjunctival injection present on the right and No conjunctival injection present on the left. PERRL bilaterally. EOMI without pain. No photophobia.  Ears: Right TM pearly gray with normal light reflex, Left TM pearly gray with normal light reflex, Left TM intact without perforation, Right TM unable to be visualized due to cerumen impaction, Left TM unable to be visualized due to cerumen impaction, Patent tympanostomy tube present on the right and Patent tympanostomy tube present on the left.  Nose/Throat: External nose within normal limits and septum midline. Turbinates minimally edematous without discharge. Posterior oropharynx mildly erythematous without cobblestoning in the posterior oropharynx. Tonsils unremarklable without exudates.  Tongue without thrush. Neck: Supple without thyromegaly. Trachea midline. Adenopathy: no enlarged lymph nodes appreciated in the occipital, axillary, epitrochlear, inguinal, or popliteal regions. Lungs: Clear to auscultation  without wheezing, rhonchi or rales. No increased work of breathing. CV: Normal S1/S2. No murmurs. Capillary refill <2 seconds.  Abdomen: Nondistended, nontender. No guarding or rebound tenderness. Bowel sounds present in all fields  Skin: Warm and dry, without lesions or rashes. Extremities:  No clubbing, cyanosis or edema. Neuro:   Grossly intact. No focal deficits appreciated. Responsive to questions.    Allergy Studies:  Indoor/Outdoor Percutaneous Adult Environmental Panel: positive to burweed marsh elder, short ragweed, giant ragweed, sheep sorrel, rough pigweed, common mugwort, ash, birch, American beech, red cedar, oak, Alternaria, Aureobasidium, Candida, Df mite, Dp mites and cat. Otherwise negative with adequate controls.  Indoor/Outdoor Selected Intradermal Environmental Panel: positive to cockroach. Otherwise negative with adequate controls.   I performed a history and physical examination of the patient and discussed her management with the Nurse Practitioner. I reviewed the Nurse Practitioner's note and agree with the documented findings and plan of care. The note in its entirety was edited by myself, including the physical exam, assessment, and plan.   Salvatore Marvel, MD [facility]

## 2017-04-13 NOTE — Patient Instructions (Addendum)
1. Perennial allergic conjunctivitis - Testing today showed: trees, weeds, indoor molds, dust mites, cat and cockroach - Avoidance measures provided. - Stop taking: Zyrtec  - Start taking: Pazeo (olopatadine) one drop per eye daily as needed  - Samples of Xyzal and Allegra provided (antihistamines to replace Zyrtec). - Call us and let us know which one works best.  - You can use an extra dose of the antihistamine, if needed, for breakthrough symptoms.  - Consider allergy shots as a means of long-term control. - Allergy shots "re-train" and "reset" the immune system to ignore environmental allergens and decrease the resulting immune response to those allergens (sneezing, itchy watery eyes, runny nose, nasal congestion, etc).    - Allergy shots improve symptoms in 75-85% of patients.  - We can discuss more at the next appointment if the medications are not working for you.  2. Return in about 6 months (around 10/12/2017).   Please inform us of any Emergency Department visits, hospitalizations, or changes in symptoms. Call us before going to the ED for breathing or allergy symptoms since we might be able to fit you in for a sick visit. Feel free to contact us anytime with any questions, problems, or concerns.  It was a pleasure to meet you today! Enjoy the fall season!  Websites that have reliable patient information: 1. American Academy of Asthma, Allergy, and Immunology: www.aaaai.org 2. Food Allergy Research and Education (FARE): foodallergy.org 3. Mothers of Asthmatics: http://www.asthmacommunitynetwork.org 4. American College of Allergy, Asthma, and Immunology: www.acaai.org   Election Day is coming up on Tuesday, November 6th! Although it is too late to register to vote by mail, you can still register up to November 5th at any of the early voting locations. Try to early vote in case there are problems with your registration!   If you are turned away at the polls, you have the right to  request a provisional ballot, which is required by law!      [place] (open 8am - 5pm) First Floor [place], [place] (open [place]) [place], [place] (open 7am - 7pm)  [place], [employer] (open 7am - 7pm) [address], [employer] (open [place]) [address], [place] (open 7am - 7pm) [place], [place] (open [place]) [place], [place] (open 7am - 7pm) [place], [place] (open 7am - 7pm) [address], [place]   Reducing Pollen Exposure  The [employer] of Allergy, Asthma and Immunology suggests the following steps to reduce your exposure to pollen during allergy seasons.    1. Do not hang sheets or clothing out to dry; pollen may collect on these items. 2. Do not mow lawns or spend time around freshly cut grass; mowing stirs up pollen. 3. Keep windows closed at night.  Keep car windows closed while driving. 4. Minimize morning activities outdoors, a time when pollen counts are usually at their highest. 5. Stay indoors as much as possible when pollen counts or humidity is high and on windy days when pollen tends to remain in the air longer. 6. Use air conditioning when possible.  Many air conditioners have filters that trap the pollen spores.  7. Use a HEPA room air filter to remove pollen form the indoor air you breathe.  Control of Mold Allergen   Mold and fungi can grow on a variety of surfaces provided certain temperature and moisture conditions exist.  Outdoor molds grow on plants, decaying vegetation and soil.  The major outdoor mold, Alternaria and Cladosporium, are found in very high numbers during hot and dry conditions.  Generally, a late Summer - Fall  peak is seen for common outdoor fungal spores.  Rain will temporarily lower outdoor mold spore count, but counts rise rapidly when the rainy period ends.  The most important indoor molds are Aspergillus and Penicillium.  Dark, humid and poorly ventilated basements are ideal sites for mold growth.  The next most common sites of mold growth are the bathroom and the kitchen.  Indoor (Perennial) Mold Control   Positive indoor molds via skin testing: Aureobasidium ([place]) and Candida  1. Maintain humidity below 50%. 2. Clean washable surfaces with 5% bleach solution. 3. Remove sources e.g. contaminated carpets.     Control of House Dust Mite Allergen    House dust mites play a major role in allergic asthma and rhinitis.  They occur in environments with high humidity wherever human skin, the food for dust mites is found. High levels have been detected in dust obtained from mattresses, pillows, carpets, upholstered furniture, bed covers, clothes and soft toys.  The principal allergen of the house dust mite is found in its feces.  A gram of dust may contain 1,000 mites and 250,000 fecal particles.  Mite antigen is easily measured in the air during house cleaning activities.    1. Encase mattresses, including the box spring, and pillow, in an air tight cover.  Seal the zipper end of the encased mattresses with wide adhesive tape. 2. Wash the bedding in water of 130 degrees Farenheit weekly.  Avoid cotton comforters/quilts and flannel bedding: the most ideal bed covering is the dacron comforter. 3. Remove all upholstered furniture from the bedroom. 4. Remove carpets, carpet padding, rugs, and non-washable window drapes from the bedroom.  Wash drapes weekly or use plastic window coverings. 5. Remove all non-washable stuffed toys from the bedroom.  Wash stuffed toys weekly. 6. Have the room cleaned frequently with a vacuum cleaner and a damp dust-mop.  The patient should not be in a room which is  being cleaned and should wait 1 hour after cleaning before going into the room. 7. Close and seal all heating outlets in the bedroom.  Otherwise, the room will become filled with dust-laden air.  An electric heater can be used to heat the room. 8. Reduce indoor humidity to less than 50%.  Do not use a humidifier.  Control of Dog or Cat Allergen  Avoidance is the best way to manage a dog or cat allergy. If you have a dog or cat and are allergic to dog or cats, consider removing the dog or cat from the home. If you have a dog or cat but don't want to find it a new home, or if your family wants a pet even though someone in the household is allergic, here are some strategies that may help keep symptoms at bay:  1. Keep the pet out of your bedroom and restrict it to only a few rooms. Be advised that keeping the dog or cat in only one room will not limit the allergens to that room. 2. Don't pet, hug or kiss the dog or cat; if  you do, wash your hands with soap and water. 3. High-efficiency particulate air (HEPA) cleaners run continuously in a bedroom or living room can reduce allergen levels over time. 4. Regular use of a high-efficiency vacuum cleaner or a central vacuum can reduce allergen levels. 5. Giving your dog or cat a bath at least once a week can reduce airborne allergen.  Control of Cockroach Allergen  Cockroach allergen has been identified as an important cause of acute attacks of asthma, especially in urban settings.  There are fifty-five species of cockroach that exist in the [place], however only three, the [place], [place] species produce allergen that can affect patients with Asthma.  Allergens can be obtained from fecal particles, egg casings and secretions from cockroaches.    1. Remove food sources. 2. Reduce access to water. 3. Seal access and entry points. 4. Spray runways with 0.5-1% Diazinon or Chlorpyrifos 5. Blow boric acid power under stoves and  refrigerator. 6. Place bait stations (hydramethylnon) at feeding sites.

## 2017-05-23 DIAGNOSIS — Z23 Encounter for immunization: Secondary | ICD-10-CM | POA: Diagnosis not present

## 2017-06-06 ENCOUNTER — Ambulatory Visit: Payer: Self-pay | Admitting: Adult Health

## 2017-06-14 NOTE — Progress Notes (Signed)
FOLLOW UP  Assessment and Plan:   Hypertension Well controlled with lifestyle management only Monitor blood pressure at home; patient to call if consistently greater than 130/80 Continue DASH diet.   Reminder to go to the ER if any CP, SOB, nausea, dizziness, severe HA, changes vision/speech, left arm numbness and tingling and jaw pain.  Cholesterol Continue medication: atorvastatin 20 mg - was off of medication at last visit, has been taking very inconsistently - taking weekly. Long conversation about specific lab values, risks associated with cholesterol elevation when combined with hyperglycemia; patient very agreeable to restarting medication and plans to restart diet.  Continue low cholesterol diet and exercise.  Check lipid panel.   Prediabetes A1C remains above goal; discussed disease and risks - reviewed good carbs/bad carbs, information provided Discussed diet/exercise, weight management  A1C  Vitamin D Def/ osteoporosis prevention Continue supplementation Check Vit D level  Depression/anxiety Currently in remission; reports no issues off of medication Lifestyle discussed: diet/exerise, sleep hygiene, stress management, hydration  Continue diet and meds as discussed. Further disposition pending results of labs. Discussed med's effects and SE's.   Over 30 minutes of exam, counseling, chart review, and critical decision making was performed.   Future Appointments  Date Time Provider [place]  09/05/2017  9:30 AM Unk Pinto, MD [facility] None  03/09/2018  2:00 PM Unk Pinto, MD [facility] None    ----------------------------------------------------------------------------------------------------------------------  HPI 76 y.o. female  presents for 3 month follow up on hypertension, cholesterol, prediabetes, weight and vitamin D deficiency. The patient also has hx of major depression, currently in remission off of medication. She reports she is doing  well, but reports she did not realize that her cholesterol was high at the last several visits and has not been taking her atorvastatin; she does have a long-standing history of mixed hyperlipidemia ongoing for over 40 years- she reports she has taken medication consistently for most of this time.   BMI is Body mass index is 23 kg/m., she has not been working on diet and exercise. Wt Readings from Last 3 Encounters:  06/15/17 134 lb (60.8 kg)  04/13/17 135 lb 7.7 oz (61.5 kg)  02/10/17 130 lb 6.4 oz (59.1 kg)   Today their BP is BP: 118/70  She does not workout. She denies chest pain, shortness of breath, dizziness.   She is prescribed atorvastatin 20 mg daily but has only been taking weekly or so and denies myalgias. Her cholesterol is not at goal. She was off of her cholesterol medication for 1 months at the last visit and demonstrated severely elevated cholesterol:   Lab Results  Component Value Date   CHOL 310 (H) 02/10/2017   HDL 49 (L) 02/10/2017   LDLCALC 126 (H) 08/04/2016   TRIG 249 (H) 02/10/2017   CHOLHDL 6.3 (H) 02/10/2017    She has not been working on diet and exercise for prediabetes, and denies increased appetite, nausea, paresthesia of the feet, polydipsia, polyuria, visual disturbances and vomiting. Last A1C in the office was:  Lab Results  Component Value Date   HGBA1C 6.0 (H) 02/10/2017   Patient is on Vitamin D supplement but remains below goal at the last check:    Lab Results  Component Value Date   VD25OH 47 02/10/2017        Current Medications:  Current Outpatient Medications on File Prior to Visit  Medication Sig  . ASPIRIN PO Take 81 mg by mouth daily.  Marland Kitchen atorvastatin (LIPITOR) 20 MG tablet TAKE 1  TABLET BY MOUTH AT BEDTIME  . Cholecalciferol (VITAMIN D PO) Take by mouth daily.  Marland Kitchen FLUZONE HIGH-DOSE 0.5 ML SUSY TO BE ADMINISTERED BY PHARMACIST FOR IMMUNIZATION  . MAGNESIUM PO Take by mouth daily.  . Olopatadine HCl (PAZEO) 0.7 % SOLN Place 1 drop  into both eyes 1 day or 1 dose.  . Omega-3 Fatty Acids (FISH OIL PO) Take by mouth daily.   No current facility-administered medications on file prior to visit.      Allergies:  Allergies  Allergen Reactions  . Codeine      Medical History:  Past Medical History:  Diagnosis Date  . Allergy   . Cancer of right breast ([place]) 1992  . Depression   . Hyperlipidemia   . Hypertension   . Vitamin D deficiency    Family history- Reviewed and unchanged Social history- Reviewed and unchanged   Review of Systems:  Review of Systems  Constitutional: Negative for malaise/fatigue and weight loss.  HENT: Negative for hearing loss and tinnitus.   Eyes: Negative for blurred vision and double vision.  Respiratory: Positive for cough. Negative for hemoptysis, sputum production, shortness of breath and wheezing.   Cardiovascular: Negative for chest pain, palpitations, orthopnea, claudication and leg swelling.  Gastrointestinal: Negative for abdominal pain, blood in stool, constipation, diarrhea, heartburn, melena, nausea and vomiting.  Genitourinary: Negative.   Musculoskeletal: Negative for joint pain and myalgias.  Skin: Negative for rash.  Neurological: Negative for dizziness, tingling, sensory change, weakness and headaches.  Endo/Heme/Allergies: Negative for polydipsia.  Psychiatric/Behavioral: Negative.   All other systems reviewed and are negative.   Physical Exam: BP 118/70   Pulse (!) 52   Temp (!) 97 F (36.1 C)   Ht 5\' 4"  (1.626 m)   Wt 134 lb (60.8 kg)   SpO2 97%   BMI 23.00 kg/m  Wt Readings from Last 3 Encounters:  06/15/17 134 lb (60.8 kg)  04/13/17 135 lb 7.7 oz (61.5 kg)  02/10/17 130 lb 6.4 oz (59.1 kg)   General Appearance: Well nourished, in no apparent distress. Eyes: PERRLA, EOMs, conjunctiva no swelling or erythema Sinuses: No Frontal/maxillary tenderness ENT/Mouth: Ext aud canals clear, TMs without erythema, bulging. No erythema, swelling, or exudate on  post pharynx.  Tonsils not swollen or erythematous. Hearing normal.  Neck: Supple, thyroid normal.  Respiratory: Respiratory effort normal, BS equal bilaterally without rales, rhonchi, wheezing or stridor.  Cardio: RRR with no MRGs. Brisk peripheral pulses without edema.  Abdomen: Soft, + BS.  Non tender, no guarding, rebound, hernias, masses. Lymphatics: Non tender without lymphadenopathy.  Musculoskeletal: Full ROM, 5/5 strength, Normal gait Skin: Warm, dry without rashes, lesions, ecchymosis.  Neuro: Cranial nerves intact. No cerebellar symptoms.  Psych: Awake and oriented X 3, normal affect, Insight and Judgment appropriate.    Izora Ribas, NP 11:07 AM [name]

## 2017-06-15 ENCOUNTER — Encounter: Payer: Self-pay | Admitting: Adult Health

## 2017-06-15 ENCOUNTER — Ambulatory Visit (INDEPENDENT_AMBULATORY_CARE_PROVIDER_SITE_OTHER): Payer: Medicare HMO | Admitting: Adult Health

## 2017-06-15 VITALS — BP 118/70 | HR 52 | Temp 97.0°F | Ht 64.0 in | Wt 134.0 lb

## 2017-06-15 DIAGNOSIS — I10 Essential (primary) hypertension: Secondary | ICD-10-CM | POA: Diagnosis not present

## 2017-06-15 DIAGNOSIS — Z79899 Other long term (current) drug therapy: Secondary | ICD-10-CM | POA: Diagnosis not present

## 2017-06-15 DIAGNOSIS — F325 Major depressive disorder, single episode, in full remission: Secondary | ICD-10-CM | POA: Diagnosis not present

## 2017-06-15 DIAGNOSIS — E559 Vitamin D deficiency, unspecified: Secondary | ICD-10-CM | POA: Diagnosis not present

## 2017-06-15 DIAGNOSIS — E782 Mixed hyperlipidemia: Secondary | ICD-10-CM

## 2017-06-15 DIAGNOSIS — R7309 Other abnormal glucose: Secondary | ICD-10-CM | POA: Diagnosis not present

## 2017-06-15 DIAGNOSIS — R69 Illness, unspecified: Secondary | ICD-10-CM | POA: Diagnosis not present

## 2017-06-15 NOTE — Patient Instructions (Addendum)
Aim for 150 minutes of cardiovascular exercise weekly. Weight bearing or high impact exercise 2-3 times a week for bone health.   Choose high fiber or whole grain breads     Bad carbs also include fruit juice, alcohol, and sweet tea. These are empty calories that do not signal to your brain that you are full.   Please remember the good carbs are still carbs which convert into sugar. So please measure them out no more than 1/2-1 cup of rice, oatmeal, pasta, and beans  Veggies are however free foods! Pile them on.   Not all fruit is created equal. Please see the list below, the fruit at the bottom is higher in sugars than the fruit at the top. Please avoid all dried fruits.        Fat and Cholesterol Restricted Diet Getting too much fat and cholesterol in your diet may cause health problems. Following this diet helps keep your fat and cholesterol at normal levels. This can keep you from getting sick. What types of fat should I choose?  Choose monosaturated and polyunsaturated fats. These are found in foods such as olive oil, canola oil, flaxseeds, walnuts, almonds, and seeds.  Eat more omega-3 fats. Good choices include salmon, mackerel, sardines, tuna, flaxseed oil, and ground flaxseeds.  Limit saturated fats. These are in animal products such as meats, butter, and cream. They can also be in plant products such as palm oil, palm kernel oil, and coconut oil.  Avoid foods with partially hydrogenated oils in them. These contain trans fats. Examples of foods that have trans fats are stick margarine, some tub margarines, cookies, crackers, and other baked goods. What general guidelines do I need to follow?  Check food labels. Look for the words "trans fat" and "saturated fat."  When preparing a meal: ? Fill half of your plate with vegetables and green salads. ? Fill one fourth of your plate with whole grains. Look for the word "whole" as the first word in the ingredient list. ? Fill  one fourth of your plate with lean protein foods.  Eat more foods that have fiber, like apples, carrots, beans, peas, and barley.  Eat more home-cooked foods. Eat less at restaurants and buffets.  Limit or avoid alcohol.  Limit foods high in starch and sugar.  Limit fried foods.  Cook foods without frying them. Baking, boiling, grilling, and broiling are all great options.  Lose weight if you are overweight. Losing even a small amount of weight can help your overall health. It can also help prevent diseases such as diabetes and heart disease. What foods can I eat? Grains Whole grains, such as whole wheat or whole grain breads, crackers, cereals, and pasta. Unsweetened oatmeal, bulgur, barley, quinoa, or brown rice. Corn or whole wheat flour tortillas. Vegetables Fresh or frozen vegetables (raw, steamed, roasted, or grilled). Green salads. Fruits All fresh, canned (in natural juice), or frozen fruits. Meat and Other Protein Products Ground beef (85% or leaner), grass-fed beef, or beef trimmed of fat. Skinless chicken or [place]. Ground chicken or [place]. Pork trimmed of fat. All fish and seafood. Eggs. Dried beans, peas, or lentils. Unsalted nuts or seeds. Unsalted canned or dry beans. Dairy Low-fat dairy products, such as skim or 1% milk, 2% or reduced-fat cheeses, low-fat ricotta or cottage cheese, or plain low-fat yogurt. Fats and Oils Tub margarines without trans fats. Light or reduced-fat mayonnaise and salad dressings. Avocado. Olive, canola, sesame, or safflower oils. Natural peanut or almond butter (choose  ones without added sugar and oil). The items listed above may not be a complete list of recommended foods or beverages. Contact your dietitian for more options. What foods are not recommended? Grains White bread. White pasta. White rice. Cornbread. Bagels, pastries, and croissants. Crackers that contain trans fat. Vegetables White potatoes. Corn. Creamed or fried vegetables.  Vegetables in a cheese sauce. Fruits Dried fruits. Canned fruit in light or heavy syrup. Fruit juice. Meat and Other Protein Products Fatty cuts of meat. Ribs, chicken wings, bacon, sausage, bologna, salami, chitterlings, fatback, hot dogs, bratwurst, and packaged luncheon meats. Liver and organ meats. Dairy Whole or 2% milk, cream, half-and-half, and cream cheese. Whole milk cheeses. Whole-fat or sweetened yogurt. Full-fat cheeses. Nondairy creamers and whipped toppings. Processed cheese, cheese spreads, or cheese curds. Sweets and Desserts Corn syrup, sugars, honey, and molasses. Candy. Jam and jelly. Syrup. Sweetened cereals. Cookies, pies, cakes, donuts, muffins, and ice cream. Fats and Oils Butter, stick margarine, lard, shortening, ghee, or bacon fat. Coconut, palm kernel, or palm oils. Beverages Alcohol. Sweetened drinks (such as sodas, lemonade, and fruit drinks or punches). The items listed above may not be a complete list of foods and beverages to avoid. Contact your dietitian for more information. This information is not intended to replace advice given to you by your health care provider. Make sure you discuss any questions you have with your health care provider. Document Released: 12/14/2011 Document Revised: 02/19/2016 Document Reviewed: 09/13/2013 Elsevier Interactive Patient Education  [employer].

## 2017-06-16 ENCOUNTER — Telehealth: Payer: Self-pay

## 2017-06-16 LAB — HEPATIC FUNCTION PANEL
AG Ratio: 1.7 (calc) (ref 1.0–2.5)
ALT: 15 U/L (ref 6–29)
AST: 20 U/L (ref 10–35)
Albumin: 4.4 g/dL (ref 3.6–5.1)
Alkaline phosphatase (APISO): 109 U/L (ref 33–130)
Bilirubin, Direct: 0.1 mg/dL (ref 0.0–0.2)
Globulin: 2.6 g/dL (ref 1.9–3.7)
Indirect Bilirubin: 0.7 mg/dL (ref 0.2–1.2)
Total Bilirubin: 0.8 mg/dL (ref 0.2–1.2)
Total Protein: 7 g/dL (ref 6.1–8.1)

## 2017-06-16 LAB — CBC WITH DIFFERENTIAL/PLATELET
Basophils Absolute: 19 {cells}/uL (ref 0–200)
Basophils Relative: 0.3 %
Eosinophils Absolute: 136 {cells}/uL (ref 15–500)
Eosinophils Relative: 2.2 %
HCT: 41.8 % (ref 35.0–45.0)
Hemoglobin: 13.9 g/dL (ref 11.7–15.5)
Lymphs Abs: 1699 cells/uL (ref 850–3900)
MCH: 28.1 pg (ref 27.0–33.0)
MCHC: 33.3 g/dL (ref 32.0–36.0)
MCV: 84.6 fL (ref 80.0–100.0)
MPV: 11.5 fL (ref 7.5–12.5)
Monocytes Relative: 8.3 %
Neutro Abs: 3832 {cells}/uL (ref 1500–7800)
Neutrophils Relative %: 61.8 %
Platelets: 320 10*3/uL (ref 140–400)
RBC: 4.94 10*6/uL (ref 3.80–5.10)
RDW: 13.1 % (ref 11.0–15.0)
Total Lymphocyte: 27.4 %
WBC mixed population: 515 {cells}/uL (ref 200–950)
WBC: 6.2 10*3/uL (ref 3.8–10.8)

## 2017-06-16 LAB — BASIC METABOLIC PANEL WITH GFR
BUN/Creatinine Ratio: 17 (calc) (ref 6–22)
BUN: 16 mg/dL (ref 7–25)
CO2: 30 mmol/L (ref 20–32)
Calcium: 10 mg/dL (ref 8.6–10.4)
Chloride: 104 mmol/L (ref 98–110)
Creat: 0.94 mg/dL — ABNORMAL HIGH (ref 0.60–0.93)
GFR, Est African American: 68 mL/min/{1.73_m2} (ref >=60)
GFR, Est Non African American: 59 mL/min/{1.73_m2} — ABNORMAL LOW (ref >=60)
Glucose, Bld: 99 mg/dL (ref 65–99)
Potassium: 4.6 mmol/L (ref 3.5–5.3)
Sodium: 139 mmol/L (ref 135–146)

## 2017-06-16 LAB — LIPID PANEL
Cholesterol: 231 mg/dL — ABNORMAL HIGH (ref <200)
HDL: 50 mg/dL — ABNORMAL LOW (ref >50)
LDL Cholesterol (Calc): 151 mg/dL (calc) — ABNORMAL HIGH
Non-HDL Cholesterol (Calc): 181 mg/dL — ABNORMAL HIGH (ref <130)
Total CHOL/HDL Ratio: 4.6 (calc) (ref <5.0)
Triglycerides: 162 mg/dL — ABNORMAL HIGH (ref <150)

## 2017-06-16 LAB — TSH: TSH: 1.05 m[IU]/L (ref 0.40–4.50)

## 2017-06-16 LAB — HEMOGLOBIN A1C
Hgb A1c MFr Bld: 6 %{Hb} — ABNORMAL HIGH (ref <5.7)
Mean Plasma Glucose: 126 (calc)
eAG (mmol/L): 7 (calc)

## 2017-06-16 LAB — VITAMIN D 25 HYDROXY (VIT D DEFICIENCY, FRACTURES): Vit D, 25-Hydroxy: 54 ng/mL (ref 30–100)

## 2017-06-16 NOTE — Telephone Encounter (Signed)
Patient called requesting a prescription for a cough.

## 2017-06-17 ENCOUNTER — Other Ambulatory Visit: Payer: Self-pay | Admitting: Adult Health

## 2017-06-17 DIAGNOSIS — R05 Cough: Secondary | ICD-10-CM

## 2017-06-17 DIAGNOSIS — R059 Cough, unspecified: Secondary | ICD-10-CM

## 2017-06-17 MED ORDER — PROMETHAZINE-DM 6.25-15 MG/5ML PO SYRP: 5.0000 mL | ORAL_SOLUTION | Freq: Four times a day (QID) | ORAL | 1 refills | Status: DC | PRN

## 2017-06-17 NOTE — Telephone Encounter (Signed)
Discussed at recent office visit; will send in as requested.

## 2017-06-17 NOTE — Progress Notes (Deleted)
Patient called to report cough without other URI symptoms; patient not on medications that may cause symptoms (ACEi, etc). Will send in prescription to pharmacy as office will be closed for next 5 days and cannot evaluate. Called to have her schedule to be evaluated if doesn't improve or she develops other symptoms.

## 2017-06-30 DIAGNOSIS — Z1231 Encounter for screening mammogram for malignant neoplasm of breast: Secondary | ICD-10-CM | POA: Diagnosis not present

## 2017-06-30 DIAGNOSIS — Z853 Personal history of malignant neoplasm of breast: Secondary | ICD-10-CM | POA: Diagnosis not present

## 2017-06-30 LAB — HM MAMMOGRAPHY

## 2017-08-01 DIAGNOSIS — H40003 Preglaucoma, unspecified, bilateral: Secondary | ICD-10-CM | POA: Diagnosis not present

## 2017-08-01 DIAGNOSIS — H16223 Keratoconjunctivitis sicca, not specified as Sjogren's, bilateral: Secondary | ICD-10-CM | POA: Diagnosis not present

## 2017-08-01 DIAGNOSIS — H02839 Dermatochalasis of unspecified eye, unspecified eyelid: Secondary | ICD-10-CM | POA: Diagnosis not present

## 2017-08-10 DIAGNOSIS — H02403 Unspecified ptosis of bilateral eyelids: Secondary | ICD-10-CM | POA: Diagnosis not present

## 2017-08-29 DIAGNOSIS — H40003 Preglaucoma, unspecified, bilateral: Secondary | ICD-10-CM | POA: Diagnosis not present

## 2017-09-01 DIAGNOSIS — R69 Illness, unspecified: Secondary | ICD-10-CM | POA: Diagnosis not present

## 2017-09-05 ENCOUNTER — Ambulatory Visit: Payer: Self-pay | Admitting: Internal Medicine

## 2017-09-06 DIAGNOSIS — K0263 Dental caries on smooth surface penetrating into pulp: Secondary | ICD-10-CM | POA: Diagnosis not present

## 2017-09-12 DIAGNOSIS — H0231 Blepharochalasis right upper eyelid: Secondary | ICD-10-CM | POA: Diagnosis not present

## 2017-09-12 DIAGNOSIS — H02409 Unspecified ptosis of unspecified eyelid: Secondary | ICD-10-CM | POA: Insufficient documentation

## 2017-09-12 DIAGNOSIS — H0234 Blepharochalasis left upper eyelid: Secondary | ICD-10-CM | POA: Diagnosis not present

## 2017-09-28 ENCOUNTER — Encounter: Payer: Self-pay | Admitting: Internal Medicine

## 2017-09-28 ENCOUNTER — Ambulatory Visit (INDEPENDENT_AMBULATORY_CARE_PROVIDER_SITE_OTHER): Payer: Medicare HMO | Admitting: Internal Medicine

## 2017-09-28 VITALS — BP 134/76 | HR 64 | Temp 97.5°F | Resp 16 | Ht 64.0 in | Wt 137.4 lb

## 2017-09-28 DIAGNOSIS — R7303 Prediabetes: Secondary | ICD-10-CM

## 2017-09-28 DIAGNOSIS — Z79899 Other long term (current) drug therapy: Secondary | ICD-10-CM | POA: Diagnosis not present

## 2017-09-28 DIAGNOSIS — E559 Vitamin D deficiency, unspecified: Secondary | ICD-10-CM | POA: Diagnosis not present

## 2017-09-28 DIAGNOSIS — E782 Mixed hyperlipidemia: Secondary | ICD-10-CM

## 2017-09-28 DIAGNOSIS — R7309 Other abnormal glucose: Secondary | ICD-10-CM

## 2017-09-28 DIAGNOSIS — I10 Essential (primary) hypertension: Secondary | ICD-10-CM

## 2017-09-28 NOTE — Progress Notes (Signed)
This very nice 77 y.o.  MWF presents for 6 month follow up with HTN, HLD, Pre-Diabetes and Vitamin D Deficiency.      Patient is treated for HTN (2004)  & BP has been controlled at home. Today's BP was initially elevated and was rechecked at goal - 134/76.  In 2005 and 2011, she had Negative Cardiolite tests. Patient has had no complaints of any cardiac type chest pain, palpitations, dyspnea / orthopnea / PND, dizziness, claudication, or dependent edema.     Hyperlipidemia is not controlled with diet as she is reticent to taking Chol meds, but at last visit did agree to restart Atorvastatin. Patient denies myalgias or other med SE's. Last Lipids were not at goal: Lab Results  Component Value Date   CHOL 231 (H) 06/15/2017   HDL 50 (L) 06/15/2017   LDLCALC 151 (H) 06/15/2017   TRIG 162 (H) 06/15/2017   CHOLHDL 4.6 06/15/2017      Also, the patient has history of PreDiabetes and has had no symptoms of reactive hypoglycemia, diabetic polys, paresthesias or visual blurring.  Last A1c was not at goal: Lab Results  Component Value Date   HGBA1C 6.0 (H) 06/15/2017      Further, the patient also has history of Vitamin D Deficiency ("23"/2008) and supplements vitamin D without any suspected side-effects. Last vitamin D was still low (goal is 70-100): Lab Results  Component Value Date   VD25OH 54 06/15/2017   Current Outpatient Medications on File Prior to Visit  Medication Sig  . ASPIRIN PO Take 81 mg by mouth daily.  Marland Kitchen atorvastatin (LIPITOR) 20 MG tablet TAKE 1 TABLET BY MOUTH AT BEDTIME  . Cholecalciferol (VITAMIN D PO) Take 5,000 Units by mouth daily.   Marland Kitchen MAGNESIUM PO Take by mouth daily.  . Olopatadine HCl (PAZEO) 0.7 % SOLN Place 1 drop into both eyes 1 day or 1 dose.  . Omega-3 Fatty Acids (FISH OIL PO) Take by mouth daily.   No current facility-administered medications on file prior to visit.    Allergies  Allergen Reactions  . Codeine    PMHx:   Past Medical History:    Diagnosis Date  . Allergy   . Cancer of right breast ([place]) 1992  . Depression   . Hyperlipidemia   . Hypertension   . Vitamin D deficiency    Immunization History  Administered Date(s) Administered  . DT 12/23/2014  . Influenza-Unspecified 04/02/2016  . Pneumococcal Conjugate-13 12/17/2013  . Pneumococcal Polysaccharide-23 10/23/2009  . Td 04/15/2005  . Zoster 11/19/2010   Past Surgical History:  Procedure Laterality Date  . ABDOMINAL HYSTERECTOMY     partial  . BREAST LUMPECTOMY Right   . TONSILLECTOMY AND ADENOIDECTOMY     FHx:    Reviewed / unchanged  SHx:    Reviewed / unchanged   Systems Review:  Constitutional: Denies fever, chills, wt changes, headaches, insomnia, fatigue, night sweats, change in appetite. Eyes: Denies redness, blurred vision, diplopia, discharge, itchy, watery eyes.  ENT: Denies discharge, congestion, post nasal drip, epistaxis, sore throat, earache, hearing loss, dental pain, tinnitus, vertigo, sinus pain, snoring.  CV: Denies chest pain, palpitations, irregular heartbeat, syncope, dyspnea, diaphoresis, orthopnea, PND, claudication or edema. Respiratory: denies cough, dyspnea, DOE, pleurisy, hoarseness, laryngitis, wheezing.  Gastrointestinal: Denies dysphagia, odynophagia, heartburn, reflux, water brash, abdominal pain or cramps, nausea, vomiting, bloating, diarrhea, constipation, hematemesis, melena, hematochezia  or hemorrhoids. Genitourinary: Denies dysuria, frequency, urgency, nocturia, hesitancy, discharge, hematuria or flank pain. Musculoskeletal:  Denies arthralgias, myalgias, stiffness, jt. swelling, pain, limping or strain/sprain.  Skin: Denies pruritus, rash, hives, warts, acne, eczema or change in skin lesion(s). Neuro: No weakness, tremor, incoordination, spasms, paresthesia or pain. Psychiatric: Denies confusion, memory loss or sensory loss. Endo: Denies change in weight, skin or hair change.  Heme/Lymph: No excessive bleeding,  bruising or enlarged lymph nodes.  Physical Exam  BP 134/76   Pulse 64   Temp (!) 97.5 F (36.4 C)   Resp 16   Ht 5\' 4"  (1.626 m)   Wt 137 lb 6.4 oz (62.3 kg)   BMI 23.58 kg/m   Appears  well nourished, well groomed  and in no distress.  Eyes: PERRLA, EOMs, conjunctiva no swelling or erythema. Sinuses: No frontal/maxillary tenderness ENT/Mouth: EAC's clear, TM's nl w/o erythema, bulging. Nares clear w/o erythema, swelling, exudates. Oropharynx clear without erythema or exudates. Oral hygiene is good. Tongue normal, non obstructing. Hearing intact.  Neck: Supple. Thyroid not palpable. Car 2+/2+ without bruits, nodes or JVD. Chest: Respirations nl with BS clear & equal w/o rales, rhonchi, wheezing or stridor.  Cor: Heart sounds normal w/ regular rate and rhythm without sig. murmurs, gallops, clicks or rubs. Peripheral pulses normal and equal  without edema.  Abdomen: Soft & bowel sounds normal. Non-tender w/o guarding, rebound, hernias, masses or organomegaly.  Lymphatics: Unremarkable.  Musculoskeletal: Full ROM all peripheral extremities, joint stability, 5/5 strength and normal gait.  Skin: Warm, dry without exposed rashes, lesions or ecchymosis apparent.  Neuro: Cranial nerves intact, reflexes equal bilaterally. Sensory-motor testing grossly intact. Tendon reflexes grossly intact.  Pysch: Alert & oriented x 3.  Insight and judgement nl & appropriate. No ideations.  Assessment and Plan:  1. Essential hypertension  - Continue medication, monitor blood pressure at home.  - Continue DASH diet.  Reminder to go to the ER if any CP,  SOB, nausea, dizziness, severe HA, changes vision/speech.  - CBC with Differential/Platelet - BASIC METABOLIC PANEL WITH GFR - Magnesium - TSH  2. Hyperlipidemia, mixed  - Continue diet/meds, exercise,& lifestyle modifications.  - Continue monitor periodic cholesterol/liver & renal functions   - Hepatic function panel - Lipid panel - TSH  3.  Abnormal glucose  - Continue diet, exercise, lifestyle modifications.  - Monitor appropriate labs.  - Hemoglobin A1c  4. Vitamin D deficiency  - Continue supplementation.   - VITAMIN D 25 Hydroxyl  5. Prediabetes  - Hemoglobin A1c - Insulin, random  6. Medication management  - CBC with Differential/Platelet - BASIC METABOLIC PANEL WITH GFR - Hepatic function panel - Magnesium - Lipid panel - TSH - Hemoglobin A1c - Insulin, random - VITAMIN D 25 Hydroxyl           Discussed  regular exercise, BP monitoring, weight control to achieve/maintain BMI less than 25 and discussed med and SE's. Recommended labs to assess and monitor clinical status with further disposition pending results of labs. Over 30 minutes of exam, counseling, chart review was performed.

## 2017-09-28 NOTE — Patient Instructions (Signed)

## 2017-09-29 ENCOUNTER — Other Ambulatory Visit: Payer: Self-pay | Admitting: Internal Medicine

## 2017-09-29 DIAGNOSIS — H02834 Dermatochalasis of left upper eyelid: Secondary | ICD-10-CM | POA: Diagnosis not present

## 2017-09-29 DIAGNOSIS — H02831 Dermatochalasis of right upper eyelid: Secondary | ICD-10-CM | POA: Diagnosis not present

## 2017-09-29 LAB — CBC WITH DIFFERENTIAL/PLATELET
Basophils Absolute: 21 cells/uL (ref 0–200)
Basophils Relative: 0.3 %
Eosinophils Absolute: 311 cells/uL (ref 15–500)
Eosinophils Relative: 4.5 %
HCT: 40.8 % (ref 35.0–45.0)
Hemoglobin: 13.6 g/dL (ref 11.7–15.5)
Lymphs Abs: 1829 {cells}/uL (ref 850–3900)
MCH: 28.5 pg (ref 27.0–33.0)
MCHC: 33.3 g/dL (ref 32.0–36.0)
MCV: 85.5 fL (ref 80.0–100.0)
MPV: 11.5 fL (ref 7.5–12.5)
Monocytes Relative: 10.9 %
Neutro Abs: 3988 cells/uL (ref 1500–7800)
Neutrophils Relative %: 57.8 %
Platelets: 310 10*3/uL (ref 140–400)
RBC: 4.77 10*6/uL (ref 3.80–5.10)
RDW: 13 % (ref 11.0–15.0)
Total Lymphocyte: 26.5 %
WBC mixed population: 752 {cells}/uL (ref 200–950)
WBC: 6.9 10*3/uL (ref 3.8–10.8)

## 2017-09-29 LAB — BASIC METABOLIC PANEL WITH GFR
BUN: 19 mg/dL (ref 7–25)
CO2: 29 mmol/L (ref 20–32)
Calcium: 9.5 mg/dL (ref 8.6–10.4)
Chloride: 103 mmol/L (ref 98–110)
Creat: 0.86 mg/dL (ref 0.60–0.93)
GFR, Est African American: 75 mL/min/{1.73_m2} (ref >=60)
GFR, Est Non African American: 65 mL/min/{1.73_m2} (ref >=60)
Glucose, Bld: 98 mg/dL (ref 65–99)
Potassium: 4.7 mmol/L (ref 3.5–5.3)
Sodium: 139 mmol/L (ref 135–146)

## 2017-09-29 LAB — HEMOGLOBIN A1C
Hgb A1c MFr Bld: 6 % of total Hgb — ABNORMAL HIGH (ref <5.7)
Mean Plasma Glucose: 126 (calc)
eAG (mmol/L): 7 (calc)

## 2017-09-29 LAB — INSULIN, RANDOM: Insulin: 6.9 u[IU]/mL (ref 2.0–19.6)

## 2017-09-29 LAB — LIPID PANEL
Cholesterol: 209 mg/dL — ABNORMAL HIGH (ref <200)
HDL: 49 mg/dL — ABNORMAL LOW (ref >50)
LDL Cholesterol (Calc): 127 mg/dL — ABNORMAL HIGH
Non-HDL Cholesterol (Calc): 160 mg/dL — ABNORMAL HIGH (ref <130)
Total CHOL/HDL Ratio: 4.3 (calc) (ref <5.0)
Triglycerides: 190 mg/dL — ABNORMAL HIGH (ref <150)

## 2017-09-29 LAB — HEPATIC FUNCTION PANEL
AG Ratio: 1.7 (calc) (ref 1.0–2.5)
ALT: 44 U/L — ABNORMAL HIGH (ref 6–29)
AST: 38 U/L — ABNORMAL HIGH (ref 10–35)
Albumin: 4.4 g/dL (ref 3.6–5.1)
Alkaline phosphatase (APISO): 115 U/L (ref 33–130)
Bilirubin, Direct: 0.1 mg/dL (ref 0.0–0.2)
Globulin: 2.6 g/dL (calc) (ref 1.9–3.7)
Indirect Bilirubin: 0.5 mg/dL (calc) (ref 0.2–1.2)
Total Bilirubin: 0.6 mg/dL (ref 0.2–1.2)
Total Protein: 7 g/dL (ref 6.1–8.1)

## 2017-09-29 LAB — VITAMIN D 25 HYDROXY (VIT D DEFICIENCY, FRACTURES): Vit D, 25-Hydroxy: 57 ng/mL (ref 30–100)

## 2017-09-29 LAB — TSH: TSH: 1.63 m[IU]/L (ref 0.40–4.50)

## 2017-09-29 LAB — MAGNESIUM: Magnesium: 2 mg/dL (ref 1.5–2.5)

## 2017-09-29 MED ORDER — EZETIMIBE 10 MG PO TABS: 10.0000 mg | ORAL_TABLET | Freq: Every day | ORAL | 1 refills | Status: DC

## 2017-10-07 ENCOUNTER — Ambulatory Visit (INDEPENDENT_AMBULATORY_CARE_PROVIDER_SITE_OTHER): Payer: Medicare HMO | Admitting: Internal Medicine

## 2017-10-07 ENCOUNTER — Other Ambulatory Visit: Payer: Self-pay | Admitting: *Deleted

## 2017-10-07 ENCOUNTER — Encounter: Payer: Self-pay | Admitting: Internal Medicine

## 2017-10-07 VITALS — BP 130/66 | HR 68 | Temp 97.5°F | Resp 16 | Ht 64.0 in | Wt 135.6 lb

## 2017-10-07 DIAGNOSIS — R42 Dizziness and giddiness: Secondary | ICD-10-CM

## 2017-10-07 MED ORDER — PREDNISONE 20 MG PO TABS: ORAL_TABLET | ORAL | 0 refills | Status: DC

## 2017-10-07 MED ORDER — EZETIMIBE 10 MG PO TABS: 10.0000 mg | ORAL_TABLET | Freq: Every day | ORAL | 1 refills | Status: DC

## 2017-10-07 NOTE — Progress Notes (Signed)
  Subjective:    Patient ID: Tiffany Vaughn, female    DOB: [birth year], 77 y.o.   MRN: [id number]  HPI  This very nice 77 yo MWF w/HTN, HLD, PreDM was just here 8-9 days ago for 6 mo f/u & was doing well. She present today with sx's of a positional vertigo.  Had similar episode about a month ago and and took Meclizine with recovery. Now he relates several day hx/o vertigo as sense of imbalance and dysphoria. Denies N/V or neuro signs / sx's. .   Medication Sig  . ASPIRIN PO Take 81 mg by mouth daily.  Marland Kitchen atorvastatin (LIPITOR) 20 MG tablet TAKE 1 TABLET BY MOUTH AT BEDTIME  . Cholecalciferol (VITAMIN D PO) Take 5,000 Units by mouth daily.   Marland Kitchen MAGNESIUM PO Take by mouth daily.  . metoprolol succinate (TOPROL-XL) 50 MG 24 hr tablet Take 50 mg by mouth daily. Take with or immediately following a meal.  . Olopatadine HCl (PAZEO) 0.7 % SOLN Place 1 drop into both eyes 1 day or 1 dose.  . Omega-3 Fatty Acids (FISH OIL PO) Take by mouth daily.  Marland Kitchen ezetimibe (ZETIA) 10 MG tablet Take 1 tablet (10 mg total) by mouth daily.   Allergies  Allergen Reactions  . Codeine    Past Medical History:  Diagnosis Date  . Allergy   . Cancer of right breast ([place]) 1992  . Depression   . Hyperlipidemia   . Hypertension   . Vitamin D deficiency    Review of Systems  10 point systems review negative except as above.    Objective:   Physical Exam  BP 130/66   Pulse 68   Temp (!) 97.5 F (36.4 C)   Resp 16   Ht 5\' 4"  (1.626 m)   Wt 135 lb 9.6 oz (61.5 kg)   BMI 23.28 kg/m   HEENT - WNL. EAC's clear &  TM's Nl. Neck - supple. Car 2+/2+ w/o Bruits , nodesof JVD. Chest - Clear equal BS. Cor - Nl HS. RRR w/o sig MGR. PP 1(+). No edema. MS- FROM w/o deformities.  Gait Nl. Neuro -  Nl w/o focal abnormalities.    Assessment & Plan:   1. Vertigo  - predniSONE (DELTASONE) 20 MG tablet; 1 tab 3 x day for 3 days, then 1 tab 2 x day for 3 days, then 1 tab 1 x day for 5 days  Dispense: 20 tablet; Refill:  0   Recommended continue her Meclizine  - discussed Epley Exercises with printed instruction

## 2017-10-07 NOTE — Patient Instructions (Signed)
Vertigo Vertigo is the feeling that you or your surroundings are moving when they are not. Vertigo can be dangerous if it occurs while you are doing something that could endanger you or others, such as driving. What are the causes? This condition is caused by a disturbance in the signals that are sent by your body's sensory systems to your brain. Different causes of a disturbance can lead to vertigo, including:  Infections, especially in the inner ear.  A bad reaction to a drug, or misuse of alcohol and medicines.  Withdrawal from drugs or alcohol.  Quickly changing positions, as when lying down or rolling over in bed.  Migraine headaches.  Decreased blood flow to the brain.  Decreased blood pressure.  Increased pressure in the brain from a head or neck injury, stroke, infection, tumor, or bleeding.  Central nervous system disorders.  What are the signs or symptoms? Symptoms of this condition usually occur when you move your head or your eyes in different directions. Symptoms may start suddenly, and they usually last for less than a minute. Symptoms may include:  Loss of balance and falling.  Feeling like you are spinning or moving.  Feeling like your surroundings are spinning or moving.  Nausea and vomiting.  Blurred vision or double vision.  Difficulty hearing.  Slurred speech.  Dizziness.  Involuntary eye movement (nystagmus).  Symptoms can be mild and cause only slight annoyance, or they can be severe and interfere with daily life. Episodes of vertigo may return (recur) over time, and they are often triggered by certain movements. Symptoms may improve over time. How is this diagnosed? This condition may be diagnosed based on medical history and the quality of your nystagmus. Your health care provider may test your eye movements by asking you to quickly change positions to trigger the nystagmus. This may be called the Dix-Hallpike test, head thrust test, or roll test.  You may be referred to a health care provider who specializes in ear, nose, and throat (ENT) problems (otolaryngologist) or a provider who specializes in disorders of the central nervous system (neurologist). You may have additional testing, including:  A physical exam.  Blood tests.  MRI.  A CT scan.  An electrocardiogram (ECG). This records electrical activity in your heart.  An electroencephalogram (EEG). This records electrical activity in your brain.  Hearing tests.  How is this treated? Treatment for this condition depends on the cause and the severity of the symptoms. Treatment options include:  Medicines to treat nausea or vertigo. These are usually used for severe cases. Some medicines that are used to treat other conditions may also reduce or eliminate vertigo symptoms. These include: ? Medicines that control allergies (antihistamines). ? Medicines that control seizures (anticonvulsants). ? Medicines that relieve depression (antidepressants). ? Medicines that relieve anxiety (sedatives).  Head movements to adjust your inner ear back to normal. If your vertigo is caused by an ear problem, your health care provider may recommend certain movements to correct the problem.  Surgery. This is rare.  Follow these instructions at home: Safety  Move slowly.Avoid sudden body or head movements.  Avoid driving.  Avoid operating heavy machinery.  Avoid doing any tasks that would cause danger to you or others if you would have a vertigo episode during the task.  If you have trouble walking or keeping your balance, try using a cane for stability. If you feel dizzy or unstable, sit down right away.  Return to your normal activities as told by your  health care provider. Ask your health care provider what activities are safe for you. General instructions  Take over-the-counter and prescription medicines only as told by your health care provider.  Avoid certain positions or  movements as told by your health care provider.  Drink enough fluid to keep your urine clear or pale yellow.  Keep all follow-up visits as told by your health care provider. This is important. Contact a health care provider if:  Your medicines do not relieve your vertigo or they make it worse.  You have a fever.  Your condition gets worse or you develop new symptoms.  Your family or friends notice any behavioral changes.  Your nausea or vomiting gets worse.  You have numbness or a "pins and needles" sensation in part of your body. Get help right away if:  You have difficulty moving or speaking.  You are always dizzy.  You faint.  You develop severe headaches.  You have weakness in your hands, arms, or legs.  You have changes in your hearing or vision.  You develop a stiff neck.  You develop sensitivity to light. +++++++++++++++ How to Perform the Epley Maneuver The Epley maneuver is an exercise that relieves symptoms of vertigo. Vertigo is the feeling that you or your surroundings are moving when they are not. When you feel vertigo, you may feel like the room is spinning and have trouble walking. Dizziness is a little different than vertigo. When you are dizzy, you may feel unsteady or light-headed. You can do this maneuver at home whenever you have symptoms of vertigo. You can do it up to 3 times a day until your symptoms go away. Even though the Epley maneuver may relieve your vertigo for a few weeks, it is possible that your symptoms will return. This maneuver relieves vertigo, but it does not relieve dizziness. What are the risks? If it is done correctly, the Epley maneuver is considered safe. Sometimes it can lead to dizziness or nausea that goes away after a short time. If you develop other symptoms, such as changes in vision, weakness, or numbness, stop doing the maneuver and call your health care provider. How to perform the Epley maneuver 1. Sit on the edge of a  bed or table with your back straight and your legs extended or hanging over the edge of the bed or table. 2. Turn your head halfway toward the affected ear or side. 3. Lie backward quickly with your head turned until you are lying flat on your back. You may want to position a pillow under your shoulders. 4. Hold this position for 30 seconds. You may experience an attack of vertigo. This is normal. 5. Turn your head to the opposite direction until your unaffected ear is facing the floor. 6. Hold this position for 30 seconds. You may experience an attack of vertigo. This is normal. Hold this position until the vertigo stops. 7. Turn your whole body to the same side as your head. Hold for another 30 seconds. 8. Sit back up. You can repeat this exercise up to 3 times a day. Follow these instructions at home:  After doing the Epley maneuver, you can return to your normal activities.  Ask your health care provider if there is anything you should do at home to prevent vertigo. He or she may recommend that you: ? Keep your head raised (elevated) with two or more pillows while you sleep. ? Do not sleep on the side of your affected ear. ?  Get up slowly from bed. ? Avoid sudden movements during the day. ? Avoid extreme head movement, like looking up or bending over. Contact a health care provider if:  Your vertigo gets worse.  You have other symptoms, including: ? Nausea. ? Vomiting. ? Headache. Get help right away if:  You have vision changes.  You have a severe or worsening headache or neck pain.  You cannot stop vomiting.  You have new numbness or weakness in any part of your body. Summary  Vertigo is the feeling that you or your surroundings are moving when they are not.  The Epley maneuver is an exercise that relieves symptoms of vertigo.  If the Epley maneuver is done correctly, it is considered safe. You can do it up to 3 times a day. This information is not intended to replace  advice given to you by your health care provider. Make sure you discuss any questions you have with your health care provider. Document Released: 06/19/2013 Document Revised: 05/04/2016 Document Reviewed: 05/04/2016 Elsevier Interactive Patient Education  2017 [employer].

## 2017-11-10 DIAGNOSIS — H02834 Dermatochalasis of left upper eyelid: Secondary | ICD-10-CM | POA: Diagnosis not present

## 2017-11-10 DIAGNOSIS — H02831 Dermatochalasis of right upper eyelid: Secondary | ICD-10-CM | POA: Diagnosis not present

## 2017-12-08 ENCOUNTER — Other Ambulatory Visit: Payer: Self-pay

## 2017-12-08 DIAGNOSIS — H02834 Dermatochalasis of left upper eyelid: Secondary | ICD-10-CM | POA: Diagnosis not present

## 2017-12-08 DIAGNOSIS — L988 Other specified disorders of the skin and subcutaneous tissue: Secondary | ICD-10-CM | POA: Diagnosis not present

## 2017-12-08 DIAGNOSIS — H57811 Brow ptosis, right: Secondary | ICD-10-CM | POA: Diagnosis not present

## 2017-12-08 DIAGNOSIS — H02831 Dermatochalasis of right upper eyelid: Secondary | ICD-10-CM | POA: Diagnosis not present

## 2017-12-08 DIAGNOSIS — H57812 Brow ptosis, left: Secondary | ICD-10-CM | POA: Diagnosis not present

## 2018-01-09 NOTE — Progress Notes (Deleted)
MEDICARE ANNUAL WELLNESS VISIT AND 3 MONTH FOLLOW UP  Assessment:   Encounter for Medicare annual wellness exam  Essential hypertension - - continue medications, DASH diet, exercise and monitor at home. Call if greater than 130/80.    Hyperlipidemia -continue medications, check lipids, decrease fatty foods, increase activity.   Vitamin D deficiency At goal at recent check;  Defer vitamin D level   Depression, remission In remission off of medications   History of cancer of right breast Continue close follow ups, mammogram annually  Osteopenia Stop evista for now, get DEXA dec  Other abnormal glucose -     Hemoglobin A1c Discussed general issues about diabetes pathophysiology and management., Educational material distributed., Suggested low cholesterol diet., Encouraged aerobic exercise., Discussed foot care., Reminded to get yearly retinal exam.  BMI 23 *** Continue to recommend diet heavy in fruits and veggies and low in animal meats, cheeses, and dairy products, appropriate calorie intake Discuss exercise recommendations routinely Continue to monitor weight at each visit   Future Appointments  Date Time Provider [place]  01/10/2018 10:00 AM Liane Comber, NP [facility] None  01/23/2018 10:00 AM Valentina Shaggy, MD [facility] None  04/14/2018 11:00 AM Unk Pinto, MD [facility] None    Plan:   During the course of the visit the patient was educated and counseled about appropriate screening and preventive services including:    Pneumococcal vaccine   Influenza vaccine  Td vaccine  Screening electrocardiogram  Screening mammography  Bone densitometry screening  Colorectal cancer screening  Diabetes screening  Glaucoma screening  Nutrition counseling   Advanced directives: given information/requested  Subjective:   Tiffany Vaughn is a 77 y.o. female who presents for Medicare Annual Wellness Visit and follow up for HTN, chol,  weight, vitamin D def. Was on Evista for history of previous right breast cancer 20 years ago but was recently discontinued.   BMI is There is no height or weight on file to calculate BMI., she {HAS HAS PIR:51884} been working on diet and exercise. Wt Readings from Last 3 Encounters:  10/07/17 135 lb 9.6 oz (61.5 kg)  09/28/17 137 lb 6.4 oz (62.3 kg)  06/15/17 134 lb (60.8 kg)   Her blood pressure has been controlled at home, today their BP is   She does not workout but she goes to [place] often and walks when she is there, she is on toprol for HA. She denies chest pain, shortness of breath, dizziness.   She is on cholesterol medication, lipitor 20 mg daily *** and zetia 10 mg daily and denies myalgias. Her cholesterol is not at goal. The cholesterol last visit was:   Lab Results  Component Value Date   CHOL 209 (H) 09/28/2017   HDL 49 (L) 09/28/2017   LDLCALC 127 (H) 09/28/2017   TRIG 190 (H) 09/28/2017   CHOLHDL 4.3 09/28/2017    She {Has/has not:18111} been working on diet and exercise for prediabetes, and denies {Symptoms; diabetes w/o none:19199}. Last A1C in the office was:  Lab Results  Component Value Date   HGBA1C 6.0 (H) 09/28/2017   Patient is on Vitamin D supplement.   Lab Results  Component Value Date   VD25OH 57 09/28/2017        Medication Review Current Outpatient Medications on File Prior to Visit  Medication Sig Dispense Refill  . ASPIRIN PO Take 81 mg by mouth daily.    Marland Kitchen atorvastatin (LIPITOR) 20 MG tablet TAKE 1 TABLET BY MOUTH AT BEDTIME 90 tablet  3  . Cholecalciferol (VITAMIN D PO) Take 5,000 Units by mouth daily.     Marland Kitchen ezetimibe (ZETIA) 10 MG tablet Take 1 tablet (10 mg total) by mouth daily. 90 tablet 1  . MAGNESIUM PO Take by mouth daily.    . metoprolol succinate (TOPROL-XL) 50 MG 24 hr tablet Take 50 mg by mouth daily. Take with or immediately following a meal.    . Olopatadine HCl (PAZEO) 0.7 % SOLN Place 1 drop into both eyes 1 day or 1 dose. 1  Bottle 5  . Omega-3 Fatty Acids (FISH OIL PO) Take by mouth daily.    . predniSONE (DELTASONE) 20 MG tablet 1 tab 3 x day for 3 days, then 1 tab 2 x day for 3 days, then 1 tab 1 x day for 5 days 20 tablet 0   No current facility-administered medications on file prior to visit.     Current Problems (verified) Patient Active Problem List   Diagnosis Date Noted  . Other abnormal glucose 01/15/2016  . Osteopenia 12/23/2014  . Medication management 10/24/2013  . Hyperlipidemia   . Hypertension   . Vitamin D deficiency   . Depression, major, in remission ([employer])   . Seasonal allergies   . Cancer of right breast ([place])     Screening Tests Immunization History  Administered Date(s) Administered  . DT 12/23/2014  . Influenza-Unspecified 04/02/2016  . Pneumococcal Conjugate-13 12/17/2013  . Pneumococcal Polysaccharide-23 10/23/2009  . Td 04/15/2005  . Zoster 11/19/2010   Preventative care: Last colonoscopy: 05/2014  Dr. Henrene Pastor Last mammogram: 06/2017 Last pap smear/pelvic exam: 2009 remote DEXA: 01/2014, t -1.6, osteopenia- DUE Cardiolite 2011 negative EF 65%   Prior vaccinations: TD : 2016  Influenza: 2017 Pneumococcal: 2011 Prevnar 13: 2015 Shingles/Zostavax: 2012  Names of Other Physician/Practitioners you currently use: 1. [facility]- here for primary care 2. Dr. Almyra Deforest, eye doctor, last visit seeing 11/2015 3. Dr. Orlando Penner, dentist, last visit q 6 months 2018  Patient Care Team: Unk Pinto, MD as PCP - General (Internal Medicine) Irene Shipper, MD as Consulting Physician (Gastroenterology) Janit Pagan, MD as Referring Physician (Cardiology)  Allergies Allergies  Allergen Reactions  . Codeine    SURGICAL HISTORY She  has a past surgical history that includes Tonsillectomy and adenoidectomy; Abdominal hysterectomy; and Breast lumpectomy (Right). FAMILY HISTORY Her family history includes Heart attack in her father and  mother; Leukemia in her brother. SOCIAL HISTORY She  reports that she has never smoked. She has never used smokeless tobacco. She reports that she does not drink alcohol or use drugs.  MEDICARE WELLNESS OBJECTIVES: Physical activity: no regular exercise at this time Depression/mood screen:   Depression screen [facility] 2/9 10/07/2017  Decreased Interest 0  Down, Depressed, Hopeless 0  PHQ - 2 Score 0   Hearing: normal Visual acuity: normal,  does perform annual eye exam  ADLs:  In your present state of health, do you have any difficulty performing the following activities: 10/07/2017 09/28/2017  Hearing? N N  Vision? N N  Difficulty concentrating or making decisions? N N  Walking or climbing stairs? N N  Dressing or bathing? N -  Doing errands, shopping? N N  Some recent data might be hidden    Fall risk: Low Risk Cognitive Testing  Alert? Yes  Normal Appearance?Yes  Oriented to person? Yes  Place? Yes   Time? Yes  Recall of three objects?  Yes  Can perform simple calculations? Yes  Displays appropriate judgment?Yes  Can read the correct time from a watch face?Yes  EOL planning:      Objective:     There were no vitals filed for this visit. There is no height or weight on file to calculate BMI.  General appearance: alert, no distress, WD/WN,  female HEENT: normocephalic, sclerae anicteric, TMs pearly, nares patent, no discharge or erythema, pharynx normal Oral cavity: MMM, no lesions Neck: supple, no lymphadenopathy, no thyromegaly, no masses Heart: RRR, normal S1, S2, no murmurs Lungs: CTA bilaterally, no wheezes, rhonchi, or rales Abdomen: +bs, soft, non tender, non distended, no masses, no hepatomegaly, no splenomegaly Musculoskeletal: nontender, no swelling, no obvious deformity Extremities: no edema, no cyanosis, no clubbing Pulses: 2+ symmetric, upper and lower extremities, normal cap refill Neurological: alert, oriented x 3, CN2-12 intact, strength normal upper  extremities and lower extremities, sensation normal throughout, DTRs 2+ throughout, no cerebellar signs, gait normal Psychiatric: normal affect, behavior normal, pleasant  Breast: defer Gyn: defer  Rectal: defer  Medicare Attestation I have personally reviewed: The patient's medical and social history Their use of alcohol, tobacco or illicit drugs Their current medications and supplements The patient's functional ability including ADLs,fall risks, home safety risks, cognitive, and hearing and visual impairment Diet and physical activities Evidence for depression or mood disorders  The patient's weight, height, BMI, and visual acuity have been recorded in the chart.  I have made referrals, counseling, and provided education to the patient based on review of the above and I have provided the patient with a written personalized care plan for preventive services.     Izora Ribas, NP   01/09/2018

## 2018-01-10 ENCOUNTER — Ambulatory Visit: Payer: Self-pay | Admitting: Adult Health

## 2018-01-16 NOTE — Progress Notes (Signed)
MEDICARE ANNUAL WELLNESS VISIT AND 3 MONTH FOLLOW UP  Assessment:   Encounter for Medicare annual wellness exam  Essential hypertension - continue medications, DASH diet, exercise and monitor at home. Call if greater than 130/80.    Hyperlipidemia -continue medications, check lipids, decrease fatty foods, increase activity.   Vitamin D deficiency At goal at recent check;  Defer vitamin D level   Depression, remission In remission off of medications   History of cancer of right breast Continue close follow ups, mammogram annually  Osteopenia Continue high calcium diet, vitamin D supplementation, high impact/weight bearing exercises Get DEXA today  Seasonal allergies Continue OTC allergy pills  Other abnormal glucose -     Hemoglobin A1c Discussed general issues about diabetes pathophysiology and management., Educational material distributed., Suggested low cholesterol diet., Encouraged aerobic exercise., Discussed foot care., Reminded to get yearly retinal exam.  BMI 22 Continue to recommend diet heavy in fruits and veggies and low in animal meats, cheeses, and dairy products, appropriate calorie intake Discuss exercise recommendations routinely Continue to monitor weight at each visit  Family history of MI in female before age 66 - requesting stress test, will refer   Future Appointments  Date Time Provider [place]  01/23/2018 10:00 AM Valentina Shaggy, MD [facility] None  04/14/2018 11:00 AM Unk Pinto, MD [facility] None    Plan:   During the course of the visit the patient was educated and counseled about appropriate screening and preventive services including:    Pneumococcal vaccine   Influenza vaccine  Td vaccine  Screening electrocardiogram  Screening mammography  Bone densitometry screening  Colorectal cancer screening  Diabetes screening  Glaucoma screening  Nutrition counseling   Advanced directives: given  information/requested  Subjective:   Tiffany Vaughn is a 77 y.o. female who presents for Medicare Annual Wellness Visit and follow up for HTN, chol, weight, vitamin D def. Was on Evista for history of previous right breast cancer 20 years ago but was recently discontinued.   Today she reports excessive itching/tearing, has bilaterally, currently worst on left. She has tried various eye drops, steroids, antihistamine drops prescribed by allergist which did not significantly help. She has appointment on ap  BMI is Body mass index is 22.31 kg/m., she has been working on diet, is active but doesn't intentionally exericse. She is cutting carbs Wt Readings from Last 3 Encounters:  01/17/18 130 lb (59 kg)  10/07/17 135 lb 9.6 oz (61.5 kg)  09/28/17 137 lb 6.4 oz (62.3 kg)   Her blood pressure has been controlled at home, today their BP is BP: 136/72 She does not workout but she goes to [place] often and walks when she is there, she is on toprol for HA. She denies chest pain, shortness of breath, dizziness.   She is on cholesterol medication, lipitor 20 mg daily and zetia 10 mg daily (new) and denies myalgias. Her cholesterol is not at goal. The cholesterol last visit was:   Lab Results  Component Value Date   CHOL 209 (H) 09/28/2017   HDL 49 (L) 09/28/2017   LDLCALC 127 (H) 09/28/2017   TRIG 190 (H) 09/28/2017   CHOLHDL 4.3 09/28/2017    She has been working on diet and exercise for prediabetes, and denies foot ulcerations, increased appetite, nausea, paresthesia of the feet, polydipsia, polyuria, visual disturbances, vomiting and weight loss. Last A1C in the office was:  Lab Results  Component Value Date   HGBA1C 6.0 (H) 09/28/2017   Patient is on  Vitamin D supplement.   Lab Results  Component Value Date   VD25OH 57 09/28/2017        Medication Review Current Outpatient Medications on File Prior to Visit  Medication Sig Dispense Refill  . ASPIRIN PO Take 81 mg by mouth daily.     Marland Kitchen atorvastatin (LIPITOR) 20 MG tablet TAKE 1 TABLET BY MOUTH AT BEDTIME 90 tablet 3  . Cholecalciferol (VITAMIN D PO) Take 5,000 Units by mouth daily.     Marland Kitchen ezetimibe (ZETIA) 10 MG tablet Take 1 tablet (10 mg total) by mouth daily. 90 tablet 1  . MAGNESIUM PO Take by mouth daily.    . metoprolol succinate (TOPROL-XL) 50 MG 24 hr tablet Take 50 mg by mouth daily. Take with or immediately following a meal.    . Omega-3 Fatty Acids (FISH OIL PO) Take by mouth daily.    . Olopatadine HCl (PAZEO) 0.7 % SOLN Place 1 drop into both eyes 1 day or 1 dose. (Patient not taking: Reported on 01/17/2018) 1 Bottle 5  . predniSONE (DELTASONE) 20 MG tablet 1 tab 3 x day for 3 days, then 1 tab 2 x day for 3 days, then 1 tab 1 x day for 5 days (Patient not taking: Reported on 01/17/2018) 20 tablet 0   No current facility-administered medications on file prior to visit.     Current Problems (verified) Patient Active Problem List   Diagnosis Date Noted  . Other abnormal glucose 01/15/2016  . Osteopenia 12/23/2014  . Medication management 10/24/2013  . Hyperlipidemia   . Hypertension   . Vitamin D deficiency   . Depression, major, in remission ([place])   . Seasonal allergies   . History of right breast cancer     Screening Tests Immunization History  Administered Date(s) Administered  . DT 12/23/2014  . Influenza-Unspecified 04/02/2016  . Pneumococcal Conjugate-13 12/17/2013  . Pneumococcal Polysaccharide-23 10/23/2009  . Td 04/15/2005  . Zoster 11/19/2010   Preventative care: Last colonoscopy: 05/2014  Dr. Henrene Pastor Last mammogram: 06/2017 Last pap smear/pelvic exam: 2009 remote DEXA: 01/2014, t -1.6, osteopenia- DUE  Cardiolite 2011 negative EF 65%   Prior vaccinations: TD : 2016  Influenza: 2017 Pneumococcal: 2011 Prevnar 13: 2015 Shingles/Zostavax: 2012  Names of Other Physician/Practitioners you currently use: 1. [facility]- here for primary care 2.  Dr. Almyra Deforest, eye doctor, last visit seeing 2019 3. Dr. Orlando Penner, dentist, last visit q 6 months 2019  Patient Care Team: Unk Pinto, MD as PCP - General (Internal Medicine) Irene Shipper, MD as Consulting Physician (Gastroenterology) Powers, Elyse Jarvis, MD as Referring Physician (Cardiology)   Allergies Allergies  Allergen Reactions  . Codeine    SURGICAL HISTORY She  has a past surgical history that includes Tonsillectomy and adenoidectomy; Abdominal hysterectomy; and Breast lumpectomy (Right). FAMILY HISTORY Her family history includes Heart attack in her father and mother; Leukemia in her brother. SOCIAL HISTORY She  reports that she has never smoked. She has never used smokeless tobacco. She reports that she does not drink alcohol or use drugs.  MEDICARE WELLNESS OBJECTIVES: Physical activity: no regular exercise at this time Depression/mood screen:   Depression screen [facility] 2/9 10/07/2017  Decreased Interest 0  Down, Depressed, Hopeless 0  PHQ - 2 Score 0   Hearing: normal Visual acuity: normal,  does perform annual eye exam  ADLs:  In your present state of health, do you have any difficulty performing the following activities: 10/07/2017 09/28/2017  Hearing? N  N  Vision? N N  Difficulty concentrating or making decisions? N N  Walking or climbing stairs? N N  Dressing or bathing? N -  Doing errands, shopping? N N  Some recent data might be hidden    Fall risk: Low Risk Cognitive Testing  Alert? Yes  Normal Appearance?Yes  Oriented to person? Yes  Place? Yes   Time? Yes  Recall of three objects?  Yes  Can perform simple calculations? Yes  Displays appropriate judgment?Yes  Can read the correct time from a watch face?Yes  EOL planning:      Objective:     Today's Vitals   01/17/18 0954  BP: 136/72  Pulse: 61  Temp: (!) 97.3 F (36.3 C)  SpO2: 97%  Weight: 130 lb (59 kg)  Height: 5\' 4"  (1.626 m)   Body mass index is 22.31 kg/m.  General appearance:  alert, no distress, WD/WN,  female HEENT: normocephalic, sclerae anicteric, TMs pearly, nares patent, no discharge or erythema, pharynx normal. Left eye with increased tearing without purulent discharge, erythema.  Oral cavity: MMM, no lesions Neck: supple, no lymphadenopathy, no thyromegaly, no masses Heart: RRR, normal S1, S2, no murmurs Lungs: CTA bilaterally, no wheezes, rhonchi, or rales Abdomen: +bs, soft, non tender, non distended, no masses, no hepatomegaly, no splenomegaly Musculoskeletal: nontender, no swelling, no obvious deformity Extremities: no edema, no cyanosis, no clubbing Pulses: 2+ symmetric, upper and lower extremities, normal cap refill Neurological: alert, oriented x 3, CN2-12 intact, strength normal upper extremities and lower extremities, sensation normal throughout, DTRs 2+ throughout, no cerebellar signs, gait normal Psychiatric: normal affect, behavior normal, pleasant  Breast: defer Gyn: defer  Rectal: defer  Medicare Attestation I have personally reviewed: The patient's medical and social history Their use of alcohol, tobacco or illicit drugs Their current medications and supplements The patient's functional ability including ADLs,fall risks, home safety risks, cognitive, and hearing and visual impairment Diet and physical activities Evidence for depression or mood disorders  The patient's weight, height, BMI, and visual acuity have been recorded in the chart.  I have made referrals, counseling, and provided education to the patient based on review of the above and I have provided the patient with a written personalized care plan for preventive services.     Izora Ribas, NP   01/17/2018

## 2018-01-17 ENCOUNTER — Ambulatory Visit (INDEPENDENT_AMBULATORY_CARE_PROVIDER_SITE_OTHER): Payer: Medicare HMO | Admitting: Adult Health

## 2018-01-17 ENCOUNTER — Encounter: Payer: Self-pay | Admitting: Adult Health

## 2018-01-17 VITALS — BP 136/72 | HR 61 | Temp 97.3°F | Ht 64.0 in | Wt 130.0 lb

## 2018-01-17 DIAGNOSIS — Z0001 Encounter for general adult medical examination with abnormal findings: Secondary | ICD-10-CM | POA: Diagnosis not present

## 2018-01-17 DIAGNOSIS — E559 Vitamin D deficiency, unspecified: Secondary | ICD-10-CM | POA: Diagnosis not present

## 2018-01-17 DIAGNOSIS — R7309 Other abnormal glucose: Secondary | ICD-10-CM

## 2018-01-17 DIAGNOSIS — E782 Mixed hyperlipidemia: Secondary | ICD-10-CM | POA: Diagnosis not present

## 2018-01-17 DIAGNOSIS — R6889 Other general symptoms and signs: Secondary | ICD-10-CM

## 2018-01-17 DIAGNOSIS — J302 Other seasonal allergic rhinitis: Secondary | ICD-10-CM | POA: Diagnosis not present

## 2018-01-17 DIAGNOSIS — Z8249 Family history of ischemic heart disease and other diseases of the circulatory system: Secondary | ICD-10-CM

## 2018-01-17 DIAGNOSIS — M8580 Other specified disorders of bone density and structure, unspecified site: Secondary | ICD-10-CM

## 2018-01-17 DIAGNOSIS — Z6822 Body mass index (BMI) 22.0-22.9, adult: Secondary | ICD-10-CM

## 2018-01-17 DIAGNOSIS — Z79899 Other long term (current) drug therapy: Secondary | ICD-10-CM

## 2018-01-17 DIAGNOSIS — F325 Major depressive disorder, single episode, in full remission: Secondary | ICD-10-CM

## 2018-01-17 DIAGNOSIS — Z853 Personal history of malignant neoplasm of breast: Secondary | ICD-10-CM

## 2018-01-17 DIAGNOSIS — R69 Illness, unspecified: Secondary | ICD-10-CM | POA: Diagnosis not present

## 2018-01-17 DIAGNOSIS — I10 Essential (primary) hypertension: Secondary | ICD-10-CM

## 2018-01-17 DIAGNOSIS — Z0000 Encounter for general adult medical examination without abnormal findings: Secondary | ICD-10-CM

## 2018-01-17 MED ORDER — NEOMYCIN-POLYMYXIN-DEXAMETH 3.5-10000-0.1 OP SUSP: 1.0000 [drp] | Freq: Four times a day (QID) | OPHTHALMIC | 1 refills | Status: DC

## 2018-01-17 NOTE — Patient Instructions (Signed)
Aim for 7+ servings of fruits and vegetables daily  80+ fluid ounces of water or unsweet tea for healthy kidneys  Limit to max 1 drink of alcohol per day, avoid smoking  Limit animal fats in diet for cholesterol and heart health - choose grass fed whenever available  Aim for low stress - take time to unwind and care for your mental health  Aim for 150 min of moderate intensity exercise weekly for heart health, and weights twice weekly for bone health  Aim for 7-9 hours of sleep daily      When it comes to diets, agreement about the perfect plan isn't easy to find, even among the experts. Experts at the [employer] developed an idea known as the Healthy Eating Plate. Just imagine a plate divided into logical, healthy portions.  The emphasis is on diet quality:  Load up on vegetables and fruits - one-half of your plate: Aim for color and variety, and remember that potatoes don't count.  Go for whole grains - one-quarter of your plate: Whole wheat, barley, wheat berries, quinoa, oats, brown rice, and foods made with them. If you want pasta, go with whole wheat pasta.  Protein power - one-quarter of your plate: Fish, chicken, beans, and nuts are all healthy, versatile protein sources. Limit red meat.  The diet, however, does go beyond the plate, offering a few other suggestions.  Use healthy plant oils, such as olive, canola, soy, corn, sunflower and peanut. Check the labels, and avoid partially hydrogenated oil, which have unhealthy trans fats.  If you're thirsty, drink water. Coffee and tea are good in moderation, but skip sugary drinks and limit milk and dairy products to one or two daily servings.  The type of carbohydrate in the diet is more important than the amount. Some sources of carbohydrates, such as vegetables, fruits, whole grains, and beans-are healthier than others.  Finally, stay active.

## 2018-01-18 LAB — COMPLETE METABOLIC PANEL WITH GFR
AG Ratio: 2 (calc) (ref 1.0–2.5)
ALT: 23 U/L (ref 6–29)
AST: 26 U/L (ref 10–35)
Albumin: 4.7 g/dL (ref 3.6–5.1)
Alkaline phosphatase (APISO): 98 U/L (ref 33–130)
BUN/Creatinine Ratio: 21 (calc) (ref 6–22)
BUN: 21 mg/dL (ref 7–25)
CO2: 28 mmol/L (ref 20–32)
Calcium: 10 mg/dL (ref 8.6–10.4)
Chloride: 104 mmol/L (ref 98–110)
Creat: 0.98 mg/dL — ABNORMAL HIGH (ref 0.60–0.93)
GFR, Est African American: 64 mL/min/{1.73_m2} (ref >=60)
GFR, Est Non African American: 55 mL/min/{1.73_m2} — ABNORMAL LOW (ref >=60)
Globulin: 2.3 g/dL (ref 1.9–3.7)
Glucose, Bld: 105 mg/dL — ABNORMAL HIGH (ref 65–99)
Potassium: 5.2 mmol/L (ref 3.5–5.3)
Sodium: 141 mmol/L (ref 135–146)
Total Bilirubin: 0.8 mg/dL (ref 0.2–1.2)
Total Protein: 7 g/dL (ref 6.1–8.1)

## 2018-01-18 LAB — HEMOGLOBIN A1C
Hgb A1c MFr Bld: 6.1 %{Hb} — ABNORMAL HIGH (ref <5.7)
Mean Plasma Glucose: 128 (calc)
eAG (mmol/L): 7.1 (calc)

## 2018-01-18 LAB — CBC WITH DIFFERENTIAL/PLATELET
Basophils Absolute: 33 cells/uL (ref 0–200)
Basophils Relative: 0.5 %
Eosinophils Absolute: 277 cells/uL (ref 15–500)
Eosinophils Relative: 4.2 %
HCT: 43.8 % (ref 35.0–45.0)
Hemoglobin: 14.5 g/dL (ref 11.7–15.5)
Lymphs Abs: 2290 {cells}/uL (ref 850–3900)
MCH: 28.5 pg (ref 27.0–33.0)
MCHC: 33.1 g/dL (ref 32.0–36.0)
MCV: 86.1 fL (ref 80.0–100.0)
MPV: 11.2 fL (ref 7.5–12.5)
Monocytes Relative: 10.1 %
Neutro Abs: 3333 cells/uL (ref 1500–7800)
Neutrophils Relative %: 50.5 %
Platelets: 332 10*3/uL (ref 140–400)
RBC: 5.09 10*6/uL (ref 3.80–5.10)
RDW: 13.1 % (ref 11.0–15.0)
Total Lymphocyte: 34.7 %
WBC mixed population: 667 cells/uL (ref 200–950)
WBC: 6.6 10*3/uL (ref 3.8–10.8)

## 2018-01-18 LAB — LIPID PANEL
Cholesterol: 144 mg/dL (ref <200)
HDL: 45 mg/dL — ABNORMAL LOW (ref >50)
LDL Cholesterol (Calc): 80 mg/dL (calc)
Non-HDL Cholesterol (Calc): 99 mg/dL (ref <130)
Total CHOL/HDL Ratio: 3.2 (calc) (ref <5.0)
Triglycerides: 103 mg/dL (ref <150)

## 2018-01-18 LAB — TSH: TSH: 1.25 mIU/L (ref 0.40–4.50)

## 2018-01-19 ENCOUNTER — Encounter: Payer: Self-pay | Admitting: Physician Assistant

## 2018-01-23 ENCOUNTER — Encounter: Payer: Self-pay | Admitting: Allergy & Immunology

## 2018-01-23 ENCOUNTER — Ambulatory Visit: Payer: Medicare HMO | Admitting: Allergy & Immunology

## 2018-01-23 ENCOUNTER — Ambulatory Visit (INDEPENDENT_AMBULATORY_CARE_PROVIDER_SITE_OTHER): Payer: Medicare HMO | Admitting: Allergy & Immunology

## 2018-01-23 VITALS — BP 112/70 | HR 64 | Resp 16

## 2018-01-23 DIAGNOSIS — J3089 Other allergic rhinitis: Secondary | ICD-10-CM | POA: Diagnosis not present

## 2018-01-23 DIAGNOSIS — J302 Other seasonal allergic rhinitis: Secondary | ICD-10-CM

## 2018-01-23 MED ORDER — AZELASTINE HCL 0.05 % OP SOLN: 1.0000 [drp] | Freq: Two times a day (BID) | OPHTHALMIC | 6 refills | Status: DC | PRN

## 2018-01-23 NOTE — Patient Instructions (Addendum)
1. Perennial allergic conjunctivitis (trees, weeds, indoor molds, dust mites, cat and cockroach) - Continue taking: antihistamines (try taking them daily to see if this will help more) - We will send in a prescription for azelastine eye drops (these are generic, so they should be relatively inexpensive)   - Try the new allergy eye drops to see if this helps. - Otherwise, give [place] a call and we can start allergy shots.  2. Return in about 6 months (around 07/26/2018).   Please inform us of any Emergency Department visits, hospitalizations, or changes in symptoms. Call us before going to the ED for breathing or allergy symptoms since we might be able to fit you in for a sick visit. Feel free to contact us anytime with any questions, problems, or concerns.  It was a pleasure to see you again today!  Websites that have reliable patient information: 1. American Academy of Asthma, Allergy, and Immunology: www.aaaai.org 2. Food Allergy Research and Education (FARE): foodallergy.org 3. Mothers of Asthmatics: http://www.asthmacommunitynetwork.org 4. American College of Allergy, Asthma, and Immunology: MonthlyElectricBill.co.uk   Make sure you are registered to vote! If you have moved or changed any of your contact information, you will need to get this updated before voting!        Allergy Shots   Allergies are the result of a chain reaction that starts in the immune system. Your immune system controls how your body defends itself. For instance, if you have an allergy to pollen, your immune system identifies pollen as an invader or allergen. Your immune system overreacts by producing antibodies called Immunoglobulin E (IgE). These antibodies travel to cells that release chemicals, causing an allergic reaction.  The concept behind allergy immunotherapy, whether it is received in the form of shots or tablets, is that the immune system can be desensitized to specific allergens that trigger allergy symptoms.  Although it requires time and patience, the payback can be long-term relief.  How Do Allergy Shots Work?  Allergy shots work much like a vaccine. Your body responds to injected amounts of a particular allergen given in increasing doses, eventually developing a resistance and tolerance to it. Allergy shots can lead to decreased, minimal or no allergy symptoms.  There generally are two phases: build-up and maintenance. Build-up often ranges from three to six months and involves receiving injections with increasing amounts of the allergens. The shots are typically given once or twice a week, though more rapid build-up schedules are sometimes used.  The maintenance phase begins when the most effective dose is reached. This dose is different for each person, depending on how allergic you are and your response to the build-up injections. Once the maintenance dose is reached, there are longer periods between injections, typically two to four weeks.  Occasionally doctors give cortisone-type shots that can temporarily reduce allergy symptoms. These types of shots are different and should not be confused with allergy immunotherapy shots.  Who Can Be Treated with Allergy Shots?  Allergy shots may be a good treatment approach for people with allergic rhinitis (hay fever), allergic asthma, conjunctivitis (eye allergy) or stinging insect allergy.   Before deciding to begin allergy shots, you should consider:  . The length of allergy season and the severity of your symptoms . Whether medications and/or changes to your environment can control your symptoms . Your desire to avoid long-term medication use . Time: allergy immunotherapy requires a major time commitment . Cost: may vary depending on your insurance coverage  Allergy shots for  children age 48 and older are effective and often well tolerated. They might prevent the onset of new allergen sensitivities or the progression to asthma.  Allergy shots  are not started on patients who are pregnant but can be continued on patients who become pregnant while receiving them. In some patients with other medical conditions or who take certain common medications, allergy shots may be of risk. It is important to mention other medications you talk to your allergist.   When Will I Feel Better?  Some may experience decreased allergy symptoms during the build-up phase. For others, it may take as long as 12 months on the maintenance dose. If there is no improvement after a year of maintenance, your allergist will discuss other treatment options with you.  If you aren't responding to allergy shots, it may be because there is not enough dose of the allergen in your vaccine or there are missing allergens that were not identified during your allergy testing. Other reasons could be that there are high levels of the allergen in your environment or major exposure to non-allergic triggers like tobacco smoke.  What Is the Length of Treatment?  Once the maintenance dose is reached, allergy shots are generally continued for three to five years. The decision to stop should be discussed with your allergist at that time. Some people may experience a permanent reduction of allergy symptoms. Others may relapse and a longer course of allergy shots can be considered.  What Are the Possible Reactions?  The two types of adverse reactions that can occur with allergy shots are local and systemic. Common local reactions include very mild redness and swelling at the injection site, which can happen immediately or several hours after. A systemic reaction, which is less common, affects the entire body or a particular body system. They are usually mild and typically respond quickly to medications. Signs include increased allergy symptoms such as sneezing, a stuffy nose or hives.  Rarely, a serious systemic reaction called anaphylaxis can develop. Symptoms include swelling in the throat,  wheezing, a feeling of tightness in the chest, nausea or dizziness. Most serious systemic reactions develop within 30 minutes of allergy shots. This is why it is strongly recommended you wait in your doctor's office for 30 minutes after your injections. Your allergist is trained to watch for reactions, and his or her staff is trained and equipped with the proper medications to identify and treat them.  Who Should Administer Allergy Shots?  The preferred location for receiving shots is your prescribing allergist's office. Injections can sometimes be given at another facility where the physician and staff are trained to recognize and treat reactions, and have received instructions by your prescribing allergist.

## 2018-01-23 NOTE — Progress Notes (Signed)
FOLLOW UP  Date of Service/Encounter:  01/23/18   Assessment:   Seasonal and perennial allergic rhinitis (trees, weeds, indoor molds, dust mites, cat and cockroach)   [name] presents for a follow-up visit.  Her eyes have continued to be a problem, and the eyedrops he prescribed were too expensive and did not help much anyway.  However, she never reached out to Tiffany Vaughn to make any changes.  In any case, she is now doing well while she is on a dexamethasone containing antibiotic eyedrop from her primary care doctor.  Steroids are not a long-term way to deal with allergic conjunctivitis, so I feel that allergy shots are likely the best step.  However, we will try using an daily antihistamine in conjunction with an ocular antihistamine.  The combination of this might be enough to help prevent her symptoms.  She will give Tiffany Vaughn call back with an update and let Tiffany Vaughn know whether she wants to begin with allergen immunotherapy. We did discuss the risks and benefits of allergen immunotherapy, and she did decide to sign the consent so that everything is in place, should she need allergy shots.     Plan/Recommendations:   1. Perennial allergic conjunctivitis (trees, weeds, indoor molds, dust mites, cat and cockroach) - Continue taking: antihistamines (try taking them daily to see if this will help more) - We will send in a prescription for azelastine eye drops (these are generic, so they should be relatively inexpensive)   - Try the new allergy eye drops to see if this helps. - Otherwise, give Tiffany Vaughn a call and we can start allergy shots.  2. Return in about 6 months (around 07/26/2018).   Subjective:   Tiffany Vaughn is a 77 y.o. female presenting today for follow up of  Chief Complaint  Patient presents with  . Itchy Eye    watery red swollen left one is worse     Tiffany Vaughn has a history of the following: Patient Active Problem List   Diagnosis Date Noted  . Other abnormal glucose  01/15/2016  . Osteopenia 12/23/2014  . Medication management 10/24/2013  . Hyperlipidemia   . Hypertension   . Vitamin D deficiency   . Depression, major, in remission (Tiffany Vaughn)   . Seasonal allergies   . History of right breast cancer     History obtained from: chart review and patient.  Maretta Los Vaughn's Primary Care Provider is Tiffany Pinto, MD.     Tiffany Vaughn is a 77 y.o. female presenting for a follow up visit.  She was last seen in October 2018.  At that time, she was evaluated for allergic rhinoconjunctivitis.  She had testing that was positive to trees, weeds, mold, dust mite, cat, and cockroach.  We recommended stopping Zyrtec and we started Pazeo 1 drop per eye daily as needed.  We also provided samples of Xyzal and Allegra.  We did discuss allergy shots.  Since the last visit, she has continued to have problems with her eyes. This summer has been particularly terrible. She has been sleeping with a cold wash cloth. She was given a combination neosporin/dexamethasone drop which hs provided some great relief over the next week. She has tried a multitude of other eye drops including Zatidor which does provide some relief but never takes it away completely. She has tried the antihistamines as needed, but was unsure whether   She has seen an ophthalmologist who never prescribed her anything. She sees Dr. Everlene Farrier in [facility].  She has had cataract surgery in the past. She had the eye surgery two years ago but she has had the eye itching for years before that. She does report that allergy shots helped with her eye symptoms in the past.   Otherwise, there have been no changes to her past medical history, surgical history, family history, or social history.    Review of Systems: a 14-point review of systems is pertinent for what is mentioned in HPI.  Otherwise, all other systems were negative. Constitutional: negative other than that listed in the HPI Eyes: negative other than that listed  in the HPI Ears, nose, mouth, throat, and face: negative other than that listed in the HPI Respiratory: negative other than that listed in the HPI Cardiovascular: negative other than that listed in the HPI Gastrointestinal: negative other than that listed in the HPI Genitourinary: negative other than that listed in the HPI Integument: negative other than that listed in the HPI Hematologic: negative other than that listed in the HPI Musculoskeletal: negative other than that listed in the HPI Neurological: negative other than that listed in the HPI Allergy/Immunologic: negative other than that listed in the HPI    Objective:   Blood pressure 112/70, pulse 64, resp. rate 16. There is no height or weight on file to calculate BMI.   Physical Exam:  General: Alert, interactive, in no acute distress. Talkative pleasant female.  Eyes: No conjunctival injection bilaterally, no discharge on the right, no discharge on the left and no Horner-Trantas dots present. There are some excoriations and general irritation over the bilateral eyes. PERRL bilaterally. EOMI without pain. No photophobia.  Ears: Right TM pearly gray with normal light reflex, Left TM pearly gray with normal light reflex, Right TM intact without perforation and Left TM intact without perforation.  Nose/Throat: External nose within normal limits and septum midline. Turbinates edematous with clear discharge. Posterior oropharynx erythematous without cobblestoning in the posterior oropharynx. Tonsils 2+ without exudates.  Tongue without thrush. Lungs: Clear to auscultation without wheezing, rhonchi or rales. No increased work of breathing. CV: Normal S1/S2. No murmurs. Capillary refill <2 seconds.  Skin: Warm and dry, without lesions or rashes. Neuro:   Grossly intact. No focal deficits appreciated. Responsive to questions.  Diagnostic studies: none    Tiffany Marvel, MD  [facility]

## 2018-02-10 ENCOUNTER — Other Ambulatory Visit: Payer: Self-pay | Admitting: Internal Medicine

## 2018-03-05 ENCOUNTER — Other Ambulatory Visit: Payer: Self-pay | Admitting: Internal Medicine

## 2018-03-09 ENCOUNTER — Encounter: Payer: Self-pay | Admitting: Internal Medicine

## 2018-03-16 DIAGNOSIS — R69 Illness, unspecified: Secondary | ICD-10-CM | POA: Diagnosis not present

## 2018-04-14 ENCOUNTER — Encounter: Payer: Self-pay | Admitting: Internal Medicine

## 2018-04-26 ENCOUNTER — Encounter: Payer: Self-pay | Admitting: Internal Medicine

## 2018-05-16 ENCOUNTER — Other Ambulatory Visit: Payer: Self-pay

## 2018-05-16 MED ORDER — EZETIMIBE 10 MG PO TABS: 10.0000 mg | ORAL_TABLET | Freq: Every day | ORAL | 1 refills | Status: DC

## 2018-06-05 ENCOUNTER — Encounter: Payer: Self-pay | Admitting: Internal Medicine

## 2018-06-05 ENCOUNTER — Other Ambulatory Visit: Payer: Medicare HMO

## 2018-06-22 ENCOUNTER — Encounter: Payer: Self-pay | Admitting: Adult Health

## 2018-07-04 DIAGNOSIS — Z780 Asymptomatic menopausal state: Secondary | ICD-10-CM | POA: Diagnosis not present

## 2018-07-04 DIAGNOSIS — M8589 Other specified disorders of bone density and structure, multiple sites: Secondary | ICD-10-CM | POA: Diagnosis not present

## 2018-07-04 DIAGNOSIS — Z853 Personal history of malignant neoplasm of breast: Secondary | ICD-10-CM | POA: Diagnosis not present

## 2018-07-04 DIAGNOSIS — Z1231 Encounter for screening mammogram for malignant neoplasm of breast: Secondary | ICD-10-CM | POA: Diagnosis not present

## 2018-07-04 LAB — HM MAMMOGRAPHY

## 2018-07-04 LAB — HM DEXA SCAN

## 2018-07-06 ENCOUNTER — Ambulatory Visit (INDEPENDENT_AMBULATORY_CARE_PROVIDER_SITE_OTHER): Payer: Medicare Other | Admitting: Internal Medicine

## 2018-07-06 ENCOUNTER — Encounter: Payer: Self-pay | Admitting: Internal Medicine

## 2018-07-06 VITALS — BP 116/64 | HR 60 | Temp 97.5°F | Resp 16 | Ht 64.5 in | Wt 132.8 lb

## 2018-07-06 DIAGNOSIS — Z136 Encounter for screening for cardiovascular disorders: Secondary | ICD-10-CM | POA: Diagnosis not present

## 2018-07-06 DIAGNOSIS — Z853 Personal history of malignant neoplasm of breast: Secondary | ICD-10-CM

## 2018-07-06 DIAGNOSIS — R7309 Other abnormal glucose: Secondary | ICD-10-CM

## 2018-07-06 DIAGNOSIS — E559 Vitamin D deficiency, unspecified: Secondary | ICD-10-CM

## 2018-07-06 DIAGNOSIS — Z1212 Encounter for screening for malignant neoplasm of rectum: Secondary | ICD-10-CM

## 2018-07-06 DIAGNOSIS — R7303 Prediabetes: Secondary | ICD-10-CM

## 2018-07-06 DIAGNOSIS — E782 Mixed hyperlipidemia: Secondary | ICD-10-CM

## 2018-07-06 DIAGNOSIS — Z0000 Encounter for general adult medical examination without abnormal findings: Secondary | ICD-10-CM

## 2018-07-06 DIAGNOSIS — Z8249 Family history of ischemic heart disease and other diseases of the circulatory system: Secondary | ICD-10-CM

## 2018-07-06 DIAGNOSIS — Z79899 Other long term (current) drug therapy: Secondary | ICD-10-CM

## 2018-07-06 DIAGNOSIS — I10 Essential (primary) hypertension: Secondary | ICD-10-CM | POA: Diagnosis not present

## 2018-07-06 DIAGNOSIS — Z0001 Encounter for general adult medical examination with abnormal findings: Secondary | ICD-10-CM

## 2018-07-06 DIAGNOSIS — Z1211 Encounter for screening for malignant neoplasm of colon: Secondary | ICD-10-CM

## 2018-07-06 NOTE — Patient Instructions (Signed)

## 2018-07-06 NOTE — Progress Notes (Signed)
[facility] Tiffany Vaughn, M.D.     Tiffany Vaughn. Tiffany Vaughn, P.A.-C Tiffany Vaughn, [place] [address] [place] [address] Telephone [phone] Telefax [phone] Annual Screening/Preventative Visit & Comprehensive Evaluation &  Examination     This very nice 78 y.o. MWF presents for a Screening /Preventative Visit & comprehensive evaluation and management of multiple medical co-morbidities.  Patient has been followed for HTN, HLD, Prediabetes  and Vitamin D Deficiency. Patient has hx/o remote Breast Ca (1992).       HTN predates circa 2004. Patient's BP has been controlled at home and patient denies any cardiac symptoms as chest pain, palpitations, shortness of breath, dizziness or ankle swelling. She had negative Cardiolite Stress tests in 2005 & 2011. Today's BP is at goal - 116/64.      Patient's hyperlipidemia is controlled with diet and medications. Patient denies myalgias or other medication SE's. Last lipids were at goal: Lab Results  Component Value Date   CHOL 144 01/17/2018   HDL 45 (L) 01/17/2018   LDLCALC 80 01/17/2018   TRIG 103 01/17/2018   CHOLHDL 3.2 01/17/2018      Patient has hx/o prediabetes (A1c 6.0% / Mar 2017)  and patient denies reactive hypoglycemic symptoms, visual blurring, diabetic polys or paresthesias. Last A1c was still not at goal: Lab Results  Component Value Date   HGBA1C 6.1 (H) 01/17/2018      Finally, patient has history of Vitamin D Deficiency ("23" / 2008) and last Vitamin D was neargoal (70-100):  Lab Results  Component Value Date   VD25OH 57 09/28/2017   Current Outpatient Medications on File Prior to Visit  Medication Sig  . ASPIRIN PO Take 81 mg by mouth daily.  Marland Kitchen atorvastatin (LIPITOR) 20 MG tablet TAKE 1 TABLET BY MOUTH AT BEDTIME  . azelastine (OPTIVAR) 0.05 % ophthalmic solution Place 1 drop into both eyes 2 (two) times daily as needed.  .  Cholecalciferol (VITAMIN D PO) Take 5,000 Units by mouth daily.   Marland Kitchen ezetimibe (ZETIA) 10 MG tablet Take 1 tablet (10 mg total) by mouth daily.  . metoprolol succinate (TOPROL-XL) 50 MG 24 hr tablet TAKE 1 TABLET BY MOUTH EVERY DAY  . neomycin-polymyxin b-dexamethasone (MAXITROL) 3.5-10000-0.1 SUSP Place 1 drop into the left eye 4 (four) times daily.  . Olopatadine HCl (PAZEO) 0.7 % SOLN Place 1 drop into both eyes 1 day or 1 dose.  . Omega-3 Fatty Acids (FISH OIL PO) Take by mouth daily.  Marland Kitchen MAGNESIUM PO Take by mouth daily.   No current facility-administered medications on file prior to visit.    Allergies  Allergen Reactions  . Codeine    Past Medical History:  Diagnosis Date  . Allergy   . Cancer of right breast ([place]) 1992  . Depression   . Hyperlipidemia   . Hypertension   . Vitamin D deficiency    Health Maintenance  Topic Date Due  . MAMMOGRAM  06/30/2018  . TETANUS/TDAP  12/22/2025  . INFLUENZA VACCINE  Completed  . DEXA SCAN  Completed  . PNA vac Low Risk Adult  Completed   Immunization History  Administered Date(s) Administered  . DT 12/23/2014  . Influenza-Unspecified 04/02/2016, 03/16/2018  . Pneumococcal Conjugate-13 12/17/2013  . Pneumococcal Polysaccharide-23 10/23/2009  . Td 04/15/2005  . Zoster 11/19/2010   Last Colon - 06/05/2014 - Dr Brazil ([facility]) recc 5 yr f/u due Dec 2020  Last MGM & BMD - 07/04/2018  at [facility]  Past Surgical History:  Procedure Laterality Date  . ABDOMINAL HYSTERECTOMY     partial  . BREAST LUMPECTOMY Right   . TONSILLECTOMY AND ADENOIDECTOMY     Family History  Problem Relation Age of Onset  . Heart attack Mother 49  . Heart attack Father 38  . Leukemia Brother    Social History   Tobacco Use  . Smoking status: Never Smoker  . Smokeless tobacco: Never Used  Substance Use Topics  . Alcohol use: No  . Drug use: No    ROS Constitutional: Denies fever, chills, weight loss/gain, headaches, insomnia,   night sweats, and change in appetite. Does c/o fatigue. Eyes: Denies redness, blurred vision, diplopia, discharge, itchy, watery eyes.  ENT: Denies discharge, congestion, post nasal drip, epistaxis, sore throat, earache, hearing loss, dental pain, Tinnitus, Vertigo, Sinus pain, snoring.  Cardio: Denies chest pain, palpitations, irregular heartbeat, syncope, dyspnea, diaphoresis, orthopnea, PND, claudication, edema Respiratory: denies cough, dyspnea, DOE, pleurisy, hoarseness, laryngitis, wheezing.  Gastrointestinal: Denies dysphagia, heartburn, reflux, water brash, pain, cramps, nausea, vomiting, bloating, diarrhea, constipation, hematemesis, melena, hematochezia, jaundice, hemorrhoids Genitourinary: Denies dysuria, frequency, urgency, nocturia, hesitancy, discharge, hematuria, flank pain Breast: Breast lumps, nipple discharge, bleeding.  Musculoskeletal: Denies arthralgia, myalgia, stiffness, Jt. Swelling, pain, limp, and strain/sprain. Denies falls. Skin: Denies puritis, rash, hives, warts, acne, eczema, changing in skin lesion Neuro: No weakness, tremor, incoordination, spasms, paresthesia, pain Psychiatric: Denies confusion, memory loss, sensory loss. Denies Depression. Endocrine: Denies change in weight, skin, hair change, nocturia, and paresthesia, diabetic polys, visual blurring, hyper / hypo glycemic episodes.  Heme/Lymph: No excessive bleeding, bruising, enlarged lymph nodes.  Physical Exam  BP 116/64   Pulse 60   Temp (!) 97.5 F (36.4 C)   Resp 16   Ht 5' 4.5" (1.638 m)   Wt 132 lb 12.8 oz (60.2 kg)   BMI 22.44 kg/m   General Appearance: Well nourished, well groomed and in no apparent distress.  Eyes: PERRLA, EOMs, conjunctiva no swelling or erythema, normal fundi and vessels. Sinuses: No frontal/maxillary tenderness ENT/Mouth: EACs patent / TMs  nl. Nares clear without erythema, swelling, mucoid exudates. Oral hygiene is good. No erythema, swelling, or exudate. Tongue  normal, non-obstructing. Tonsils not swollen or erythematous. Hearing normal.  Neck: Supple, thyroid not palpable. No bruits, nodes or JVD. Respiratory: Respiratory effort normal.  BS equal and clear bilateral without rales, rhonci, wheezing or stridor. Cardio: Heart sounds are normal with regular rate and rhythm and no murmurs, rubs or gallops. Peripheral pulses are normal and equal bilaterally without edema. No aortic or femoral bruits. Chest: symmetric with normal excursions and percussion. Breasts: Symmetric, without lumps, nipple discharge, retractions, or fibrocystic changes.  Abdomen: Flat, soft with bowel sounds active. Nontender, no guarding, rebound, hernias, masses, or organomegaly.  Lymphatics: Non tender without lymphadenopathy.  Genitourinary:  Musculoskeletal: Full ROM all peripheral extremities, joint stability, 5/5 strength, and normal gait. Skin: Warm and dry without rashes, lesions, cyanosis, clubbing or  ecchymosis.  Neuro: Cranial nerves intact, reflexes equal bilaterally. Normal muscle tone, no cerebellar symptoms. Sensation intact.  Pysch: Alert and oriented X 3, normal affect, Insight and Judgment appropriate.   Assessment and Plan  1. Annual Preventative Screening Examination  2. Essential hypertension  - EKG 12-Lead - Urinalysis, Routine w reflex microscopic - Microalbumin / creatinine urine ratio - CBC with Differential/Platelet - COMPLETE METABOLIC PANEL WITH GFR - Magnesium - TSH  3. Hyperlipidemia, mixed  - EKG 12-Lead - Lipid panel - TSH  4.  Abnormal glucose  - EKG 12-Lead - Hemoglobin A1c - Insulin, random  5. Vitamin D deficiency  - VITAMIN D 25 Hydroxyl  6. Prediabetes  - EKG 12-Lead - Hemoglobin A1c - Insulin, random  7. History of right breast cancer  8. Screening for ischemic heart disease  - EKG 12-Lead  9. FHx: heart disease  - EKG 12-Lead  10. Screening for colorectal cancer  - POC Hemoccult Bld/Stl   11.  Medication management  - Urinalysis, Routine w reflex microscopic - Microalbumin / creatinine urine ratio - CBC with Differential/Platelet - COMPLETE METABOLIC PANEL WITH GFR - Magnesium - Lipid panel - TSH - Hemoglobin A1c - Insulin, random - VITAMIN D 25 Hydroxyl       Patient was counseled in prudent diet to achieve/maintain BMI less than 25 for weight control, BP monitoring, regular exercise and medications. Discussed med's effects and SE's. Screening labs and tests as requested with regular follow-up as recommended. Over 40 minutes of exam, counseling, chart review and high complex critical decision making was performed.

## 2018-07-07 LAB — LIPID PANEL
Cholesterol: 152 mg/dL (ref <200)
HDL: 48 mg/dL — ABNORMAL LOW (ref >50)
LDL Cholesterol (Calc): 85 mg/dL (calc)
Non-HDL Cholesterol (Calc): 104 mg/dL (calc) (ref <130)
Total CHOL/HDL Ratio: 3.2 (calc) (ref <5.0)
Triglycerides: 93 mg/dL (ref <150)

## 2018-07-07 LAB — CBC WITH DIFFERENTIAL/PLATELET
Absolute Monocytes: 607 cells/uL (ref 200–950)
Basophils Absolute: 40 {cells}/uL (ref 0–200)
Basophils Relative: 0.6 %
Eosinophils Absolute: 251 {cells}/uL (ref 15–500)
Eosinophils Relative: 3.8 %
HCT: 42.2 % (ref 35.0–45.0)
Hemoglobin: 14.3 g/dL (ref 11.7–15.5)
Lymphs Abs: 1749 cells/uL (ref 850–3900)
MCH: 29.2 pg (ref 27.0–33.0)
MCHC: 33.9 g/dL (ref 32.0–36.0)
MCV: 86.3 fL (ref 80.0–100.0)
MPV: 11.1 fL (ref 7.5–12.5)
Monocytes Relative: 9.2 %
Neutro Abs: 3953 cells/uL (ref 1500–7800)
Neutrophils Relative %: 59.9 %
Platelets: 358 10*3/uL (ref 140–400)
RBC: 4.89 10*6/uL (ref 3.80–5.10)
RDW: 13.4 % (ref 11.0–15.0)
Total Lymphocyte: 26.5 %
WBC: 6.6 10*3/uL (ref 3.8–10.8)

## 2018-07-07 LAB — TSH: TSH: 1.28 mIU/L (ref 0.40–4.50)

## 2018-07-07 LAB — URINALYSIS, ROUTINE W REFLEX MICROSCOPIC
Bilirubin Urine: NEGATIVE
Glucose, UA: NEGATIVE
Hgb urine dipstick: NEGATIVE
Ketones, ur: NEGATIVE
Leukocytes, UA: NEGATIVE
Nitrite: NEGATIVE
Protein, ur: NEGATIVE
Specific Gravity, Urine: 1.022 (ref 1.001–1.03)
pH: <=5 (ref 5.0–8.0)

## 2018-07-07 LAB — HEMOGLOBIN A1C
Hgb A1c MFr Bld: 6.3 % of total Hgb — ABNORMAL HIGH (ref <5.7)
Mean Plasma Glucose: 134 (calc)
eAG (mmol/L): 7.4 (calc)

## 2018-07-07 LAB — COMPLETE METABOLIC PANEL WITH GFR
AG Ratio: 1.9 (calc) (ref 1.0–2.5)
ALT: 16 U/L (ref 6–29)
AST: 19 U/L (ref 10–35)
Albumin: 4.7 g/dL (ref 3.6–5.1)
Alkaline phosphatase (APISO): 113 U/L (ref 33–130)
BUN: 19 mg/dL (ref 7–25)
CO2: 30 mmol/L (ref 20–32)
Calcium: 9.9 mg/dL (ref 8.6–10.4)
Chloride: 103 mmol/L (ref 98–110)
Creat: 0.93 mg/dL (ref 0.60–0.93)
GFR, Est African American: 68 mL/min/{1.73_m2} (ref >=60)
GFR, Est Non African American: 58 mL/min/{1.73_m2} — ABNORMAL LOW (ref >=60)
Globulin: 2.5 g/dL (calc) (ref 1.9–3.7)
Glucose, Bld: 94 mg/dL (ref 65–99)
Potassium: 4.7 mmol/L (ref 3.5–5.3)
Sodium: 140 mmol/L (ref 135–146)
Total Bilirubin: 0.6 mg/dL (ref 0.2–1.2)
Total Protein: 7.2 g/dL (ref 6.1–8.1)

## 2018-07-07 LAB — MICROALBUMIN / CREATININE URINE RATIO
Creatinine, Urine: 124 mg/dL (ref 20–275)
Microalb Creat Ratio: 5 mcg/mg creat (ref <30)
Microalb, Ur: 0.6 mg/dL

## 2018-07-07 LAB — MAGNESIUM: Magnesium: 2.1 mg/dL (ref 1.5–2.5)

## 2018-07-07 LAB — INSULIN, RANDOM: Insulin: 5.8 u[IU]/mL (ref 2.0–19.6)

## 2018-07-07 LAB — VITAMIN D 25 HYDROXY (VIT D DEFICIENCY, FRACTURES): Vit D, 25-Hydroxy: 90 ng/mL (ref 30–100)

## 2018-07-09 ENCOUNTER — Encounter: Payer: Self-pay | Admitting: Internal Medicine

## 2018-07-10 ENCOUNTER — Encounter: Payer: Self-pay | Admitting: *Deleted

## 2018-07-24 ENCOUNTER — Encounter: Payer: Self-pay | Admitting: Internal Medicine

## 2018-09-06 ENCOUNTER — Other Ambulatory Visit: Payer: Self-pay | Admitting: *Deleted

## 2018-09-06 MED ORDER — METOPROLOL SUCCINATE ER 50 MG PO TB24: 50.0000 mg | ORAL_TABLET | Freq: Every day | ORAL | 1 refills | Status: DC

## 2018-09-06 MED ORDER — EZETIMIBE 10 MG PO TABS: 10.0000 mg | ORAL_TABLET | Freq: Every day | ORAL | 1 refills | Status: DC

## 2018-09-06 MED ORDER — ATORVASTATIN CALCIUM 20 MG PO TABS: 20.0000 mg | ORAL_TABLET | Freq: Every day | ORAL | 3 refills | Status: DC

## 2018-09-19 ENCOUNTER — Other Ambulatory Visit: Payer: Self-pay | Admitting: Adult Health

## 2018-10-05 ENCOUNTER — Other Ambulatory Visit: Payer: Self-pay

## 2018-10-05 ENCOUNTER — Ambulatory Visit (INDEPENDENT_AMBULATORY_CARE_PROVIDER_SITE_OTHER): Payer: Medicare Other | Admitting: Internal Medicine

## 2018-10-05 VITALS — BP 126/82 | HR 64 | Temp 97.0°F | Resp 16 | Ht 64.5 in | Wt 130.0 lb

## 2018-10-05 DIAGNOSIS — R7309 Other abnormal glucose: Secondary | ICD-10-CM | POA: Diagnosis not present

## 2018-10-05 DIAGNOSIS — E782 Mixed hyperlipidemia: Secondary | ICD-10-CM

## 2018-10-05 DIAGNOSIS — R7303 Prediabetes: Secondary | ICD-10-CM

## 2018-10-05 DIAGNOSIS — E559 Vitamin D deficiency, unspecified: Secondary | ICD-10-CM

## 2018-10-05 DIAGNOSIS — Z79899 Other long term (current) drug therapy: Secondary | ICD-10-CM

## 2018-10-05 DIAGNOSIS — I10 Essential (primary) hypertension: Secondary | ICD-10-CM

## 2018-10-05 NOTE — Progress Notes (Signed)
[facility]  Unk Pinto, M.D.  Uvaldo Bristle. Silverio Lay, P.A.-C Liane Comber, [place] [address] [place], [place] [address] Telephone [phone] Telefax [phone]   History of Present Illness:      This very nice 78 y.o. MWF presents for 3 month follow up with HTN, HLD, Pre-Diabetes and Vitamin D Deficiency.       Patient is treated for HTN (2004) & BP has been controlled at home. Today's BP: 126/82 - sitting and 102/76 - standing) - drops 24 points.  Patient reports episodes of postural lightheadedness. Patient has had no complaints of any cardiac type chest pain, palpitations, dyspnea / orthopnea / PND, claudication, or dependent edema. She had negative Cardiolite  Stress tests x 2 in 2005 and 2011.       Hyperlipidemia is controlled with diet & meds. Patient denies myalgias or other med SE's. Last Lipids were at goal: Lab Results  Component Value Date   CHOL 152 07/06/2018   HDL 48 (L) 07/06/2018   LDLCALC 85 07/06/2018   TRIG 93 07/06/2018   CHOLHDL 3.2 07/06/2018        Also, the patient has history of PreDiabetes  (A1c 6.0% / 2017)  and has had no symptoms of reactive hypoglycemia, diabetic polys, paresthesias or visual blurring.  Last A1c was not at goal: Lab Results  Component Value Date   HGBA1C 6.3 (H) 07/06/2018      Further, the patient also has history of Vitamin D Deficiency("23" / 2008)  and supplements vitamin D without any suspected side-effects. Last vitamin D was at goal:  Lab Results  Component Value Date   VD25OH 90 07/06/2018   Current Outpatient Medications on File Prior to Visit  Medication Sig  . ASPIRIN PO Take 81 mg by mouth daily.  Marland Kitchen atorvastatin (LIPITOR) 20 MG tablet Take 1 tablet (20 mg total) by mouth at bedtime.  Marland Kitchen azelastine (OPTIVAR) 0.05 % ophthalmic solution Place 1 drop into both eyes 2 (two) times daily as needed.  . Cholecalciferol (VITAMIN D PO) Take  5,000 Units by mouth daily.   Marland Kitchen ezetimibe (ZETIA) 10 MG tablet Take 1 tablet (10 mg total) by mouth daily.  . metoprolol succinate (TOPROL-XL) 50 MG 24 hr tablet TAKE 1 TABLET BY MOUTH EVERY DAY  . Omega-3 Fatty Acids (FISH OIL PO) Take by mouth daily.   No current facility-administered medications on file prior to visit.    Allergies  Allergen Reactions  . Codeine    PMHx:   Past Medical History:  Diagnosis Date  . Allergy   . Cancer of right breast ([place]) 1992  . Depression   . Hyperlipidemia   . Hypertension   . Vitamin D deficiency    Immunization History  Administered Date(s) Administered  . DT 12/23/2014  . Influenza-Unspecified 04/02/2016, 03/16/2018  . Pneumococcal Conjugate-13 12/17/2013  . Pneumococcal Polysaccharide-23 10/23/2009  . Td 04/15/2005  . Zoster 11/19/2010    Past Surgical History:  Procedure Laterality Date  . ABDOMINAL HYSTERECTOMY     partial  . BREAST LUMPECTOMY Right   . TONSILLECTOMY AND ADENOIDECTOMY      FHx:    Reviewed / unchanged  SHx:    Reviewed / unchanged   Systems Review:  Constitutional: Denies fever, chills, wt c  hanges, headaches, insomnia, fatigue, night sweats, change in appetite. Eyes: Denies redness, blurred vision, diplopia, discharge, itchy, watery eyes.   ENT: Denies discharge, congestion, post nasal drip, epistaxis, sore throat, earache, hearing loss, dental pain, tinnitus, vertigo, sinus pain, snoring.  CV: Denies chest pain, palpitations, irregular heartbeat, syncope, dyspnea, diaphoresis, orthopnea, PND, claudication or edema. Respiratory: denies cough, dyspnea, DOE, pleurisy, hoarseness, laryngitis, wheezing.  Gastrointestinal: Denies dysphagia, odynophagia, heartburn, reflux, water brash, abdominal pain or cramps, nausea, vomiting, bloating, diarrhea, constipation, hematemesis, melena, hematochezia  or hemorrhoids. Genitourinary: Denies dysuria, frequency, urgency, nocturia, hesitancy, discharge, hematuria or flank pain. Musculoskeletal: Denies arthralgias, myalgias, stiffness, jt. swelling, pain, limping or strain/sprain.  Skin: Denies pruritus, rash, hives, warts, acne, eczema or change in skin lesion(s). Neuro: No weakness, tremor, incoordination, spasms, paresthesia or pain. Psychiatric: Denies confusion, memory loss or sensory loss. Endo: Denies change in weight, skin or hair change.  Heme/Lymph: No excessive bleeding, bruising or enlarged lymph nodes.  Physical Exam  BP 126/82 Comment: 126/82-sit/102/76- standing  Pulse 64   Temp (!) 97 F (36.1 C)   Resp 16   Ht 5' 4.5" (1.638 m)   Wt 130 lb (59 kg)   BMI 21.97 kg/m   Appears  well nourished, well groomed  and in no distress.  Eyes: PERRLA, EOMs, conjunctiva no swelling or erythema. Sinuses: No frontal/maxillary tenderness ENT/Mouth: EAC's clear, TM's nl w/o erythema, bulging. Nares clear w/o erythema, swelling, exudates. Oropharynx clear without erythema or exudates. Oral hygiene is good. Tongue normal, non obstructing. Hearing intact.  Neck: Supple. Thyroid not palpable. Car 2+/2+ without bruits, nodes or JVD. Chest: Respirations nl with BS clear & equal w/o rales, rhonchi, wheezing or stridor.  Cor: Heart sounds normal w/ regular rate and rhythm without sig. murmurs,  gallops, clicks or rubs. Peripheral pulses normal and equal  without edema.  Abdomen: Soft & bowel sounds normal. Non-tender w/o guarding, rebound, hernias, masses or organomegaly.  Lymphatics: Unremarkable.  Musculoskeletal: Full ROM all peripheral extremities, joint stability, 5/5 strength and normal gait.  Skin: Warm, dry without exposed rashes, lesions or ecchymosis apparent.  Neuro: Cranial nerves intact, reflexes equal bilaterally. Sensory-motor testing grossly intact. Tendon reflexes grossly intact.  Pysch: Alert & oriented x 3.  Insight and judgement nl & appropriate. No ideations.  Assessment and Plan:  1. Essential hypertension  - Continue medication, monitor blood pressure at home.  - Continue DASH diet.  Reminder to go to the ER if any CP,  SOB, nausea, dizziness, severe HA, changes vision/speech.  - Advised to change her g Toprol 50 XL ti 1/2 tablet at bedtime &  Begin monitoring postural BP's for a while  - CBC with Differential/Platelet - COMPLETE METABOLIC PANEL WITH GFR - Magnesium  2. Hyperlipidemia, mixed  - Continue diet/meds, exercise,& lifestyle modifications.  - Continue monitor periodic cholesterol/liver & renal functions   - Lipid panel - TSH  3. Abnormal glucose  - Continue diet, exercise  - Lifestyle modifications.  - Monitor appropriate labs.  - Hemoglobin A1c - Insulin, random  4. Vitamin D deficiency  - Continue supplementation.  - VITAMIN D 25 Hydroxyl  5. Prediabetes  - Hemoglobin A1c - Insulin, random  6. Medication management  - CBC with Differential/Platelet - COMPLETE METABOLIC PANEL WITH GFR - Magnesium - Lipid panel - TSH - Hemoglobin A1c - Insulin, random - VITAMIN D 25 Hydroxyl       Discussed  regular exercise, BP monitoring, weight control to achieve/maintain BMI less than 25 and discussed med  and SE's. Recommended labs to assess and monitor clinical status with further disposition pending results of labs.  I  discussed the assessment and treatment plan with the patient. I provided over 30 minutes of exam, counseling, chart review and  complex critical decision making. The patient was provided an opportunity to ask questions and all were answered. The patient agreed with the plan and demonstrated an understanding of the instructions.

## 2018-10-05 NOTE — Patient Instructions (Addendum)
Decrease Toprol 50 mg to 1/2 tablet daily   ======================================  Coronavirus (COVID-19) Are you at risk?  Are you at risk for the Coronavirus (COVID-19)?  To be considered HIGH RISK for Coronavirus (COVID-19), you have to meet the following criteria:  . Traveled to [place], [place], [place], [place] or [place]; or in the [place] to [place], [place], [place]  . or [place]; and have fever, cough, and shortness of breath within the last 2 weeks of travel OR . Been in close contact with a person diagnosed with COVID-19 within the last 2 weeks and have  . fever, cough,and shortness of breath .  . IF YOU DO NOT MEET THESE CRITERIA, YOU ARE CONSIDERED LOW RISK FOR COVID-19.  What to do if you are HIGH RISK for COVID-19?  Marland Kitchen If you are having a medical emergency, call 911. . Seek medical care right away. Before you go to a doctor's office, urgent care or emergency department, .  call ahead and tell them about your recent travel, contact with someone diagnosed with COVID-19  .  and your symptoms.  . You should receive instructions from your physician's office regarding next steps of care.  . When you arrive at healthcare provider, tell the healthcare staff immediately you have returned from  . visiting [place], [place], [place], [place] or [place]; or traveled in the [place] to [place], [place],  . [place] or [place] in the last two weeks or you have been in close contact with a person diagnosed with  . COVID-19 in the last 2 weeks.   . Tell the health care staff about your symptoms: fever, cough and shortness of breath. . After you have been seen by a medical provider, you will be either: o Tested for (COVID-19) and discharged home on quarantine except to seek medical care if  o symptoms worsen, and asked to  - Stay home and avoid contact with others until you get your results (4-5 days)  - Avoid travel on public transportation if possible (such  as bus, train, or airplane) or o Sent to the Emergency Department by EMS for evaluation, COVID-19 testing  and  o possible admission depending on your condition and test results.  What to do if you are LOW RISK for COVID-19?  Reduce your risk of any infection by using the same precautions used for avoiding the common cold or flu:  Marland Kitchen Wash your hands often with soap and warm water for at least 20 seconds.  If soap and water are not readily available,  . use an alcohol-based hand sanitizer with at least 60% alcohol.  . If coughing or sneezing, cover your mouth and nose by coughing or sneezing into the elbow areas of your shirt or coat, .  into a tissue or into your sleeve (not your hands). . Avoid shaking hands with others and consider head nods or verbal greetings only. . Avoid touching your eyes, nose, or mouth with unwashed hands.  . Avoid close contact with people who are sick. . Avoid places or events with large numbers of people in one location, like concerts or sporting events. . Carefully consider travel plans you have or are making. . If you are planning any travel outside or inside the [place], visit the CDC's Travelers' Health webpage for the latest health notices. . If you have some symptoms but not all symptoms, continue to monitor at home and seek medical attention  . if your  symptoms worsen. . If you are having a medical emergency, call 911.   ++++++++++++++++++++++++++++++++ Recommend Adult Low Dose Aspirin or  coated  Aspirin 81 mg daily  To reduce risk of Colon Cancer 20 %,  Skin Cancer 26 % ,  Melanoma 46%  and  Pancreatic cancer 60% ++++++++++++++++++++++++++++++++ Vitamin D goal  is between 70-100.  Please make sure that you are taking your Vitamin D as directed.  It is very important as a natural anti-inflammatory  helping hair, skin, and nails, as well as reducing stroke and heart attack risk.  It helps your bones and helps with mood. It also decreases numerous  cancer risks so please take it as directed.  Low Vit D is associated with a 200-300% higher risk for CANCER  and 200-300% higher risk for HEART   ATTACK  &  STROKE.   .....................................Marland Kitchen It is also associated with higher death rate at younger ages,  autoimmune diseases like Rheumatoid arthritis, Lupus, Multiple Sclerosis.    Also many other serious conditions, like depression, Alzheimer's Dementia, infertility, muscle aches, fatigue, fibromyalgia - just to name a few. ++++++++++++++++++++ Recommend the book "The END of DIETING" by Dr Excell Seltzer  & the book "The END of DIABETES " by Dr Excell Seltzer At [email or web address] - get book & Audio CD's    Being diabetic has a  300% increased risk for heart attack, stroke, cancer, and alzheimer- type vascular dementia. It is very important that you work harder with diet by avoiding all foods that are white. Avoid white rice (brown & wild rice is OK), white potatoes (sweetpotatoes in moderation is OK), White bread or wheat bread or anything made out of white flour like bagels, donuts, rolls, buns, biscuits, cakes, pastries, cookies, pizza crust, and pasta (made from white flour & egg whites) - vegetarian pasta or spinach or wheat pasta is OK. Multigrain breads like Arnold's or Pepperidge Farm, or multigrain sandwich thins or flatbreads.  Diet, exercise and weight loss can reverse and cure diabetes in the early stages.  Diet, exercise and weight loss is very important in the control and prevention of complications of diabetes which affects every system in your body, ie. Brain - dementia/stroke, eyes - glaucoma/blindness, heart - heart attack/heart failure, kidneys - dialysis, stomach - gastric paralysis, intestines - malabsorption, nerves - severe painful neuritis, circulation - gangrene & loss of a leg(s), and finally cancer and Alzheimers.    I recommend avoid fried & greasy foods,  sweets/candy, white rice (brown or wild rice or Quinoa is OK), white  potatoes (sweet potatoes are OK) - anything made from white flour - bagels, doughnuts, rolls, buns, biscuits,white and wheat breads, pizza crust and traditional pasta made of white flour & egg white(vegetarian pasta or spinach or wheat pasta is OK).  Multi-grain bread is OK - like multi-grain flat bread or sandwich thins. Avoid alcohol in excess. Exercise is also important.    Eat all the vegetables you want - avoid meat, especially red meat and dairy - especially cheese.  Cheese is the most concentrated form of trans-fats which is the worst thing to clog up our arteries. Veggie cheese is OK which can be found in the fresh produce section at [employer] or [employer] or [employer]  +++++++++++++++++++++ DASH Eating Plan  DASH stands for "Dietary Approaches to Stop Hypertension."   The DASH eating plan is a healthy eating plan that has been shown to reduce high blood pressure (hypertension). Additional health benefits may include reducing the  risk of type 2 diabetes mellitus, heart disease, and stroke. The DASH eating plan may also help with weight loss. WHAT DO I NEED TO KNOW ABOUT THE DASH EATING PLAN? For the DASH eating plan, you will follow these general guidelines:  Choose foods with a percent daily value for sodium of less than 5% (as listed on the food label).  Use salt-free seasonings or herbs instead of table salt or sea salt.  Check with your health care provider or pharmacist before using salt substitutes.  Eat lower-sodium products, often labeled as "lower sodium" or "no salt added."  Eat fresh foods.  Eat more vegetables, fruits, and low-fat dairy products.  Choose whole grains. Look for the word "whole" as the first word in the ingredient list.  Choose fish   Limit sweets, desserts, sugars, and sugary drinks.  Choose heart-healthy fats.  Eat veggie cheese   Eat more home-cooked food and less restaurant, buffet, and fast food.  Limit fried foods.  Cook foods  using methods other than frying.  Limit canned vegetables. If you do use them, rinse them well to decrease the sodium.  When eating at a restaurant, ask that your food be prepared with less salt, or no salt if possible.                      WHAT FOODS CAN I EAT? Read Dr Fara Olden Fuhrman's books on The End of Dieting & The End of Diabetes  Grains Whole grain or whole wheat bread. Brown rice. Whole grain or whole wheat pasta. Quinoa, bulgur, and whole grain cereals. Low-sodium cereals. Corn or whole wheat flour tortillas. Whole grain cornbread. Whole grain crackers. Low-sodium crackers.  Vegetables Fresh or frozen vegetables (raw, steamed, roasted, or grilled). Low-sodium or reduced-sodium tomato and vegetable juices. Low-sodium or reduced-sodium tomato sauce and paste. Low-sodium or reduced-sodium canned vegetables.   Fruits All fresh, canned (in natural juice), or frozen fruits.  Protein Products  All fish and seafood.  Dried beans, peas, or lentils. Unsalted nuts and seeds. Unsalted canned beans.  Dairy Low-fat dairy products, such as skim or 1% milk, 2% or reduced-fat cheeses, low-fat ricotta or cottage cheese, or plain low-fat yogurt. Low-sodium or reduced-sodium cheeses.  Fats and Oils Tub margarines without trans fats. Light or reduced-fat mayonnaise and salad dressings (reduced sodium). Avocado. Safflower, olive, or canola oils. Natural peanut or almond butter.  Other Unsalted popcorn and pretzels. The items listed above may not be a complete list of recommended foods or beverages. Contact your dietitian for more options.  +++++++++++++++  WHAT FOODS ARE NOT RECOMMENDED? Grains/ White flour or wheat flour White bread. White pasta. White rice. Refined cornbread. Bagels and croissants. Crackers that contain trans fat.  Vegetables  Creamed or fried vegetables. Vegetables in a . Regular canned vegetables. Regular canned tomato sauce and paste. Regular tomato and vegetable  juices.  Fruits Dried fruits. Canned fruit in light or heavy syrup. Fruit juice.  Meat and Other Protein Products Meat in general - RED meat & White meat.  Fatty cuts of meat. Ribs, chicken wings, all processed meats as bacon, sausage, bologna, salami, fatback, hot dogs, bratwurst and packaged luncheon meats.  Dairy Whole or 2% milk, cream, half-and-half, and cream cheese. Whole-fat or sweetened yogurt. Full-fat cheeses or blue cheese. Non-dairy creamers and whipped toppings. Processed cheese, cheese spreads, or cheese curds.  Condiments Onion and garlic salt, seasoned salt, table salt, and sea salt. Canned and packaged gravies. Worcestershire sauce. Tartar sauce.  Barbecue sauce. Teriyaki sauce. Soy sauce, including reduced sodium. Steak sauce. Fish sauce. Oyster sauce. Cocktail sauce. Horseradish. Ketchup and mustard. Meat flavorings and tenderizers. Bouillon cubes. Hot sauce. Tabasco sauce. Marinades. Taco seasonings. Relishes.  Fats and Oils Butter, stick margarine, lard, shortening and bacon fat. Coconut, palm kernel, or palm oils. Regular salad dressings.  Pickles and olives. Salted popcorn and pretzels.  The items listed above may not be a complete list of foods and beverages to avoid.

## 2018-10-06 ENCOUNTER — Encounter: Payer: Self-pay | Admitting: Internal Medicine

## 2018-10-06 LAB — CBC WITH DIFFERENTIAL/PLATELET
Absolute Monocytes: 640 cells/uL (ref 200–950)
Basophils Absolute: 23 cells/uL (ref 0–200)
Basophils Relative: 0.3 %
Eosinophils Absolute: 172 cells/uL (ref 15–500)
Eosinophils Relative: 2.2 %
HCT: 43.4 % (ref 35.0–45.0)
Hemoglobin: 15 g/dL (ref 11.7–15.5)
Lymphs Abs: 2223 cells/uL (ref 850–3900)
MCH: 29.9 pg (ref 27.0–33.0)
MCHC: 34.6 g/dL (ref 32.0–36.0)
MCV: 86.5 fL (ref 80.0–100.0)
MPV: 11.6 fL (ref 7.5–12.5)
Monocytes Relative: 8.2 %
Neutro Abs: 4742 cells/uL (ref 1500–7800)
Neutrophils Relative %: 60.8 %
Platelets: 351 10*3/uL (ref 140–400)
RBC: 5.02 10*6/uL (ref 3.80–5.10)
RDW: 13.8 % (ref 11.0–15.0)
Total Lymphocyte: 28.5 %
WBC: 7.8 10*3/uL (ref 3.8–10.8)

## 2018-10-06 LAB — COMPLETE METABOLIC PANEL WITH GFR
AG Ratio: 2.1 (calc) (ref 1.0–2.5)
ALT: 17 U/L (ref 6–29)
AST: 18 U/L (ref 10–35)
Albumin: 4.7 g/dL (ref 3.6–5.1)
Alkaline phosphatase (APISO): 98 U/L (ref 37–153)
BUN: 21 mg/dL (ref 7–25)
CO2: 26 mmol/L (ref 20–32)
Calcium: 9.8 mg/dL (ref 8.6–10.4)
Chloride: 107 mmol/L (ref 98–110)
Creat: 0.92 mg/dL (ref 0.60–0.93)
GFR, Est African American: 69 mL/min/{1.73_m2} (ref >=60)
GFR, Est Non African American: 59 mL/min/{1.73_m2} — ABNORMAL LOW (ref >=60)
Globulin: 2.2 g/dL (calc) (ref 1.9–3.7)
Glucose, Bld: 94 mg/dL (ref 65–99)
Potassium: 4.4 mmol/L (ref 3.5–5.3)
Sodium: 141 mmol/L (ref 135–146)
Total Bilirubin: 0.5 mg/dL (ref 0.2–1.2)
Total Protein: 6.9 g/dL (ref 6.1–8.1)

## 2018-10-06 LAB — INSULIN, RANDOM: Insulin: 5.3 u[IU]/mL

## 2018-10-06 LAB — LIPID PANEL
Cholesterol: 138 mg/dL (ref <200)
HDL: 47 mg/dL — ABNORMAL LOW (ref >=50)
LDL Cholesterol (Calc): 74 mg/dL (calc)
Non-HDL Cholesterol (Calc): 91 mg/dL (calc) (ref <130)
Total CHOL/HDL Ratio: 2.9 (calc) (ref <5.0)
Triglycerides: 88 mg/dL (ref <150)

## 2018-10-06 LAB — TSH: TSH: 1.31 mIU/L (ref 0.40–4.50)

## 2018-10-06 LAB — HEMOGLOBIN A1C
Hgb A1c MFr Bld: 6.1 % of total Hgb — ABNORMAL HIGH (ref <5.7)
Mean Plasma Glucose: 128 (calc)
eAG (mmol/L): 7.1 (calc)

## 2018-10-06 LAB — VITAMIN D 25 HYDROXY (VIT D DEFICIENCY, FRACTURES): Vit D, 25-Hydroxy: 83 ng/mL (ref 30–100)

## 2018-10-06 LAB — MAGNESIUM: Magnesium: 2.1 mg/dL (ref 1.5–2.5)

## 2018-10-12 ENCOUNTER — Ambulatory Visit: Payer: Self-pay | Admitting: Adult Health

## 2018-10-17 ENCOUNTER — Other Ambulatory Visit: Payer: Self-pay | Admitting: *Deleted

## 2018-10-17 ENCOUNTER — Other Ambulatory Visit: Payer: Self-pay | Admitting: Internal Medicine

## 2018-10-17 ENCOUNTER — Telehealth: Payer: Self-pay | Admitting: *Deleted

## 2018-10-17 MED ORDER — FLUDROCORTISONE ACETATE 0.1 MG PO TABS: ORAL_TABLET | ORAL | 2 refills | Status: DC

## 2018-10-17 NOTE — Telephone Encounter (Signed)
Patient reported her blood pressure is continuing to drop when she stands up.  Per Dr Melford Aase, the patient was advised to stop the Toprol and he sent in a new RX for Florinef 0.1 mg. The patient should check her BP sitting and standing twice a day, and if the systolic is less than 854, she should take one Florinef, but no more than 2 tablets daily. Patient scheduled a 10 day follow up visit with Dr Melford Aase.

## 2018-10-20 NOTE — Progress Notes (Signed)
Virtual Visit via Telephone Note  I connected with Tiffany Vaughn on 10/23/18 at 12:45 PM EDT by telephone and verified that I am speaking with the correct person using two identifiers.   I discussed the limitations, risks, security and privacy concerns of performing an evaluation and management service by telephone and the availability of in person appointments. I also discussed with the patient that there may be a patient responsible charge related to this service. The patient expressed understanding and agreed to proceed.   I discussed the assessment and treatment plan with the patient. The patient was provided an opportunity to ask questions and all were answered. The patient agreed with the plan and demonstrated an understanding of the instructions.   The patient was advised to call back or seek an in-person evaluation if the symptoms worsen or if the condition fails to improve as anticipated.  I provided 22 minutes of non-face-to-face time during this encounter.   Izora Ribas, NP    MEDICARE ANNUAL WELLNESS VISIT AND 3 MONTH FOLLOW UP  Assessment:   Encounter for Medicare annual wellness exam  Essential hypertension - off of medications, DASH diet, exercise and monitor at home. Call if greater than 130/80.    Hyperlipidemia -continue medications, check lipids, decrease fatty foods, increase activity.   Vitamin D deficiency At goal at recent check;  Defer vitamin D level   Depression, remission In remission off of medications   History of cancer of right breast Continue close follow ups, mammogram annually  Osteopenia Continue high calcium diet, vitamin D supplementation, high impact/weight bearing exercises Repeat dexa 06/2020  Seasonal allergies Continue OTC allergy pills  Other abnormal glucose -     Hemoglobin A1c at [facility]MOV Discussed general issues about diabetes pathophysiology and management., Educational material distributed., Suggested low cholesterol  diet., Encouraged aerobic exercise., Discussed foot care., Reminded to get yearly retinal exam.  BMI 21 Continue to recommend diet heavy in fruits and veggies and low in animal meats, cheeses, and dairy products, appropriate calorie intake Discuss exercise recommendations routinely Continue to monitor weight at each visit  Defer all labs to routine OVs   Future Appointments  Date Time Provider [place]  10/26/2018 12:00 PM Unk Pinto, MD [facility] None  01/17/2019 10:30 AM Unk Pinto, MD [facility] None  07/27/2019 10:00 AM Unk Pinto, MD [facility] None    Plan:   During the course of the visit the patient was educated and counseled about appropriate screening and preventive services including:    Pneumococcal vaccine   Influenza vaccine  Td vaccine  Screening electrocardiogram  Screening mammography  Bone densitometry screening  Colorectal cancer screening  Diabetes screening  Glaucoma screening  Nutrition counseling   Advanced directives: given information/requested  Subjective:   Tiffany Vaughn is a 78 y.o. female who presents for Medicare Annual Wellness Visit and follow up for HTN, chol, weight, vitamin D def.   Was on Evista for history of previous right breast cancer 20 years ago but was recently discontinued.   No concerns today  Just had 3 month follow up OV -  -------------------------------------------------------------------------  BMI is Body mass index is 21.97 kg/m., she has been working on diet, is active but doesn't intentionally exericse. She is cutting carbs Wt Readings from Last 3 Encounters:  10/23/18 130 lb (59 kg)  10/05/18 130 lb (59 kg)  07/06/18 132 lb 12.8 oz (60.2 kg)   Her blood pressure has been controlled at home, today their BP is  She does not workout but she goes to [place] often and walks when she is there, she is on toprol for HA. She denies chest pain, shortness of breath, dizziness.    She is on cholesterol medication, lipitor 20 mg daily and zetia 10 mg daily (new) and denies myalgias. Her cholesterol is not at goal. The cholesterol last visit was:   Lab Results  Component Value Date   CHOL 138 10/05/2018   HDL 47 (L) 10/05/2018   LDLCALC 74 10/05/2018   TRIG 88 10/05/2018   CHOLHDL 2.9 10/05/2018    She has been working on diet and exercise for prediabetes, and denies foot ulcerations, increased appetite, nausea, paresthesia of the feet, polydipsia, polyuria, visual disturbances, vomiting and weight loss. Last A1C in the office was:  Lab Results  Component Value Date   HGBA1C 6.1 (H) 10/05/2018   Lab Results  Component Value Date   GFRNONAA 35 (L) 10/05/2018   Patient is on Vitamin D supplement.   Lab Results  Component Value Date   VD25OH 83 10/05/2018        Medication Review Current Outpatient Medications on File Prior to Visit  Medication Sig Dispense Refill  . Ascorbic Acid (VITAMIN C PO) Take by mouth daily.    . ASPIRIN PO Take 81 mg by mouth daily.    Marland Kitchen atorvastatin (LIPITOR) 20 MG tablet Take 1 tablet (20 mg total) by mouth at bedtime. 90 tablet 3  . azelastine (OPTIVAR) 0.05 % ophthalmic solution Place 1 drop into both eyes 2 (two) times daily as needed. 6 mL 6  . Cholecalciferol (VITAMIN D PO) Take 5,000 Units by mouth daily.     Marland Kitchen ezetimibe (ZETIA) 10 MG tablet Take 1 tablet (10 mg total) by mouth daily. 90 tablet 1  . fludrocortisone (FLORINEF) 0.1 MG tablet Take 1 tablet 2 x /day for Low  BP (Patient taking differently: Take 1 tablet 2 x /day for Low  BP as needed) 60 tablet 2  . Omega-3 Fatty Acids (FISH OIL PO) Take by mouth daily.    . metoprolol succinate (TOPROL-XL) 50 MG 24 hr tablet TAKE 1 TABLET BY MOUTH EVERY DAY (Patient not taking: Reported on 10/23/2018) 90 tablet 1   No current facility-administered medications on file prior to visit.     Current Problems (verified) Patient Active Problem List   Diagnosis Date Noted  .  Other abnormal glucose 01/15/2016  . Osteopenia 12/23/2014  . Medication management 10/24/2013  . Hyperlipidemia   . Hypertension   . Vitamin D deficiency   . Depression, major, in remission ([place])   . Seasonal allergies   . History of right breast cancer     Screening Tests Immunization History  Administered Date(s) Administered  . DT 12/23/2014  . Influenza-Unspecified 04/02/2016, 03/16/2018  . Pneumococcal Conjugate-13 12/17/2013  . Pneumococcal Polysaccharide-23 10/23/2009  . Td 04/15/2005  . Zoster 11/19/2010   Preventative care: Last colonoscopy: 05/2014  Dr. Henrene Pastor Last mammogram: 06/2018 Last pap smear/pelvic exam: 2009 remote DEXA: 06/2018, t -1.8, osteopenia Cardiolite 2011 negative EF 65%   Prior vaccinations: TD : 2016  Influenza: 2019 Pneumococcal: 2011 Prevnar 13: 2015 Shingles/Zostavax: 2012  Names of Other Physician/Practitioners you currently use: 1. [facility]- here for primary care 2. Dr. Almyra Deforest, eye doctor, last visit seeing 2019 3. Dr. Deanna Artis, dentist, last visit q 6 months 2019  Patient Care Team: Unk Pinto, MD as PCP - General (Internal Medicine) Irene Shipper, MD as Consulting  Physician (Gastroenterology) Powers, Elyse Jarvis, MD as Referring Physician (Cardiology)   Allergies Allergies  Allergen Reactions  . Codeine    SURGICAL HISTORY She  has a past surgical history that includes Tonsillectomy and adenoidectomy; Abdominal hysterectomy; Breast lumpectomy (Right); and Cataract extraction, bilateral (Bilateral, 2016). FAMILY HISTORY Her family history includes Heart attack (age of onset: 44) in her mother; Heart attack (age of onset: 31) in her father; Leukemia in her brother. SOCIAL HISTORY She  reports that she has never smoked. She has never used smokeless tobacco. She reports that she does not drink alcohol or use drugs.  MEDICARE WELLNESS OBJECTIVES: Physical activity: no regular exercise at  this time Depression/mood screen:   Depression screen [facility] 2/9 10/23/2018  Decreased Interest 0  Down, Depressed, Hopeless 0  PHQ - 2 Score 0   Hearing: normal Visual acuity: normal,  does perform annual eye exam  ADLs:  In your present state of health, do you have any difficulty performing the following activities: 10/23/2018 07/09/2018  Hearing? N N  Vision? N N  Difficulty concentrating or making decisions? N N  Walking or climbing stairs? N N  Dressing or bathing? N N  Doing errands, shopping? N N  Some recent data might be hidden    Fall risk: Low Risk Cognitive Testing  Alert? Yes  Normal Appearance?Yes  Oriented to person? Yes  Place? Yes   Time? Yes  Recall of three objects?  Yes  Can perform simple calculations? Yes  Displays appropriate judgment?Yes  Can read the correct time from a watch face?Yes  EOL planning: Does Patient Have a Medical Advance Directive?: Yes Type of Advance Directive: Healthcare Power of Attorney, Living will Does patient want to make changes to medical advance directive?: No - Patient declined Copy of [place] in Chart?: No - copy requested    Objective:     Today's Vitals   10/23/18 1228  Weight: 130 lb (59 kg)   Body mass index is 21.97 kg/m.  General : Well sounding patient in no apparent distress HEENT: no hoarseness, no cough for duration of visit Lungs: speaks in complete sentences, no audible wheezing, no apparent distress Neurological: alert, oriented x 3 Psychiatric: pleasant, judgement appropriate    Medicare Attestation I have personally reviewed: The patient's medical and social history Their use of alcohol, tobacco or illicit drugs Their current medications and supplements The patient's functional ability including ADLs,fall risks, home safety risks, cognitive, and hearing and visual impairment Diet and physical activities Evidence for depression or mood disorders  The patient's weight, height,  BMI, and visual acuity have been recorded in the chart.  I have made referrals, counseling, and provided education to the patient based on review of the above and I have provided the patient with a written personalized care plan for preventive services.     Izora Ribas, NP   10/23/2018

## 2018-10-23 ENCOUNTER — Encounter: Payer: Self-pay | Admitting: Adult Health

## 2018-10-23 ENCOUNTER — Ambulatory Visit: Payer: Medicare Other | Admitting: Adult Health

## 2018-10-23 ENCOUNTER — Other Ambulatory Visit: Payer: Self-pay

## 2018-10-23 VITALS — Wt 130.0 lb

## 2018-10-23 DIAGNOSIS — M8580 Other specified disorders of bone density and structure, unspecified site: Secondary | ICD-10-CM

## 2018-10-23 DIAGNOSIS — R6889 Other general symptoms and signs: Secondary | ICD-10-CM | POA: Diagnosis not present

## 2018-10-23 DIAGNOSIS — Z79899 Other long term (current) drug therapy: Secondary | ICD-10-CM

## 2018-10-23 DIAGNOSIS — F325 Major depressive disorder, single episode, in full remission: Secondary | ICD-10-CM

## 2018-10-23 DIAGNOSIS — Z0001 Encounter for general adult medical examination with abnormal findings: Secondary | ICD-10-CM | POA: Diagnosis not present

## 2018-10-23 DIAGNOSIS — Z0000 Encounter for general adult medical examination without abnormal findings: Secondary | ICD-10-CM

## 2018-10-23 DIAGNOSIS — E559 Vitamin D deficiency, unspecified: Secondary | ICD-10-CM | POA: Diagnosis not present

## 2018-10-23 DIAGNOSIS — J302 Other seasonal allergic rhinitis: Secondary | ICD-10-CM | POA: Diagnosis not present

## 2018-10-23 DIAGNOSIS — I10 Essential (primary) hypertension: Secondary | ICD-10-CM

## 2018-10-23 DIAGNOSIS — R7309 Other abnormal glucose: Secondary | ICD-10-CM

## 2018-10-23 DIAGNOSIS — Z853 Personal history of malignant neoplasm of breast: Secondary | ICD-10-CM

## 2018-10-23 DIAGNOSIS — E782 Mixed hyperlipidemia: Secondary | ICD-10-CM

## 2018-10-23 DIAGNOSIS — Z6821 Body mass index (BMI) 21.0-21.9, adult: Secondary | ICD-10-CM

## 2018-10-26 ENCOUNTER — Other Ambulatory Visit: Payer: Self-pay

## 2018-10-26 ENCOUNTER — Encounter: Payer: Self-pay | Admitting: Internal Medicine

## 2018-10-26 ENCOUNTER — Ambulatory Visit (INDEPENDENT_AMBULATORY_CARE_PROVIDER_SITE_OTHER): Payer: Medicare Other | Admitting: Internal Medicine

## 2018-10-26 VITALS — BP 104/72 | HR 80 | Temp 97.1°F | Resp 16 | Ht 64.5 in | Wt 132.4 lb

## 2018-10-26 DIAGNOSIS — I951 Orthostatic hypotension: Secondary | ICD-10-CM

## 2018-10-26 DIAGNOSIS — Z79899 Other long term (current) drug therapy: Secondary | ICD-10-CM

## 2018-10-26 NOTE — Progress Notes (Signed)
History of Present Illness:      This very nice 78 y.o. MWF with HTN, HLD, Pre-Diabetes and Vitamin D Deficiency returns for 2 week f/u postural hypotension with BP dropping 24 points with standing. She had labs checked & was advised to change her Toprol 50 mg to bedtime and cut the dose in 1/2. Since last visit she called reporting postural low BP's and a rx for Florinef was sent in. Her BP stabilized in the low normal range so she never started the Florinef. Today she does report ongoing sx's of postural weakness stating her "legs feel like rubber" when she stands up. Recheck of postural BP's found sitting BP 128/91  P 84    &   Standing BP 112/81   P 81 - a 16 sys / 10 diast drop.      Patient has been treated for HTN  circa 2004 & BP had been controlled on Toprol. Today's BP by the nurse was 104/72-sit  & 114/70-stand.  In 2005 & 2011, she had Negative Stress Cardiolite's. Patient has had no complaints of any cardiac type chest pain, palpitations, dyspnea / orthopnea / PND, claudication, or dependent edema.      Hyperlipidemia is controlled with diet & Atorvastatin.Zetia. Patient denies myalgias or other med SE's. Recent  Lipids were at goal: Lab Results  Component Value Date   CHOL 138 10/05/2018   HDL 47 (L) 10/05/2018   LDLCALC 74 10/05/2018   TRIG 88 10/05/2018   CHOLHDL 2.9 10/05/2018       Also, the patient has history of PreDiabetes (A1c 6.0% / 2017) and has had no symptoms of reactive hypoglycemia, diabetic polys, paresthesias or visual blurring.  Last A1c was not at goal: Lab Results  Component Value Date   HGBA1C 6.1 (H) 10/05/2018       Further, the patient also has history of Vitamin D Deficiency ("23" / 2008) and supplements vitamin D without any suspected side-effects. Last vitamin D was at goal: Lab Results  Component Value Date   VD25OH 83 10/05/2018   Current Outpatient Medications on File Prior to Visit  Medication Sig  . Ascorbic Acid (VITAMIN C PO) Take by  mouth daily.  . ASPIRIN PO Take 81 mg by mouth daily.  Marland Kitchen atorvastatin (LIPITOR) 20 MG tablet Take 1 tablet (20 mg total) by mouth at bedtime.  Marland Kitchen azelastine (OPTIVAR) 0.05 % ophthalmic solution Place 1 drop into both eyes 2 (two) times daily as needed.  . Cholecalciferol (VITAMIN D PO) Take 5,000 Units by mouth daily.   Marland Kitchen ezetimibe (ZETIA) 10 MG tablet Take 1 tablet (10 mg total) by mouth daily.  . fludrocortisone (FLORINEF) 0.1 MG tablet Take 1 tablet 2 x /day for Low  BP (Patient taking differently: Take 1 tablet 2 x /day for Low  BP as needed)  . Omega-3 Fatty Acids (FISH OIL PO) Take by mouth daily.   No current facility-administered medications on file prior to visit.    Allergies  Allergen Reactions  . Codeine    PMHx:   Past Medical History:  Diagnosis Date  . Allergy   . Cancer of right breast ([place]) 1992  . Depression   . Hyperlipidemia   . Hypertension   . Vitamin D deficiency    Immunization History  Administered Date(s) Administered  . DT 12/23/2014  . Influenza-Unspecified 04/02/2016, 03/16/2018  . Pneumococcal Conjugate-13 12/17/2013  . Pneumococcal Polysaccharide-23 10/23/2009  . Td 04/15/2005  . Zoster 11/19/2010  Past Surgical History:  Procedure Laterality Date  . ABDOMINAL HYSTERECTOMY     partial  . BREAST LUMPECTOMY Right   . CATARACT EXTRACTION, BILATERAL Bilateral 2016  . TONSILLECTOMY AND ADENOIDECTOMY     FHx:    Reviewed / unchanged SHx:    Reviewed / unchanged   Systems Review:  Constitutional: Denies fever, chills, wt changes, headaches, insomnia, fatigue, night sweats, change in appetite. Eyes: Denies redness, blurred vision, diplopia, discharge, itchy, watery eyes.  ENT: Denies discharge, congestion, post nasal drip, epistaxis, sore throat, earache, hearing loss, dental pain, tinnitus, vertigo, sinus pain, snoring.  CV: Denies chest pain, palpitations, irregular heartbeat, syncope, dyspnea, diaphoresis, orthopnea, PND, claudication or  edema. Respiratory: denies cough, dyspnea, DOE, pleurisy, hoarseness, laryngitis, wheezing.  Gastrointestinal: Denies dysphagia, odynophagia, heartburn, reflux, water brash, abdominal pain or cramps, nausea, vomiting, bloating, diarrhea, constipation, hematemesis, melena, hematochezia  or hemorrhoids. Genitourinary: Denies dysuria, frequency, urgency, nocturia, hesitancy, discharge, hematuria or flank pain. Musculoskeletal: Denies arthralgias, myalgias, stiffness, jt. swelling, pain, limping or strain/sprain.  Skin: Denies pruritus, rash, hives, warts, acne, eczema or change in skin lesion(s). Neuro: No weakness, tremor, incoordination, spasms, paresthesia or pain. Psychiatric: Denies confusion, memory loss or sensory loss. Endo: Denies change in weight, skin or hair change.  Heme/Lymph: No excessive bleeding, bruising or enlarged lymph nodes.  Physical Exam  BP 104/72-sit    &   114/70-stand  P 80   T 97.1 F    R 16   Ht 5' 4.5" )   Wt 132 lb   BMI 22.38  Rechk postural sitting BP 128/91 / P 84    &   Standing   BP 112/81 /  P 81 - a 16 sys / 10 diast drop.  Appears  well nourished, well groomed  and in no distress.  Eyes: PERRLA, EOMs, conjunctiva no swelling or erythema. Sinuses: No frontal/maxillary tenderness ENT/Mouth: EAC's clear, TM's nl w/o erythema, bulging. Nares clear w/o erythema, swelling, exudates. Oropharynx clear without erythema or exudates. Oral hygiene is good. Tongue normal, non obstructing. Hearing intact.  Neck: Supple. Thyroid not palpable. Car 2+/2+ without bruits, nodes or JVD. Chest: Respirations nl with BS clear & equal w/o rales, rhonchi, wheezing or stridor.  Cor: Heart sounds normal w/ regular rate and rhythm without sig. murmurs, gallops, clicks or rubs. Peripheral pulses normal and equal  without edema.  Abdomen: Soft & bowel sounds normal. Non-tender w/o guarding, rebound, hernias, masses or organomegaly.  Lymphatics: Unremarkable.  Musculoskeletal:  Full ROM all peripheral extremities, joint stability, 5/5 strength and normal gait.  Skin: Warm, dry without exposed rashes, lesions or ecchymosis apparent.  Neuro: Cranial nerves intact, reflexes equal bilaterally. Sensory-motor testing grossly intact. Tendon reflexes grossly intact.  Pysch: Alert & oriented x 3.  Insight and judgement nl & appropriate. No ideations.  Assessment and Plan:  1. Postural hypotension  - advised to stop her low dose Toprol & continue monitoring standing BP's and call if sys BP's drop below 338 systolic.   2. Medication management     Discussed  regular exercise and discussed med and SE's. Reviewed recent labs with patient . Over 20  minutes of exam, counseling, chart review was performed.        I discussed the assessment and treatment plan with the patient. The patient was provided an opportunity to ask questions and all were answered. The patient agreed with the plan and demonstrated an understanding of the instructions.        The patient was  advised to call back or seek an in-person evaluation if the symptoms worsen or if the condition fails to improve as anticipated.     Kirtland Bouchard, MD

## 2018-10-26 NOTE — Patient Instructions (Addendum)
Coronavirus (COVID-19) Are you at risk?  Are you at risk for the Coronavirus (COVID-19)?  To be considered HIGH RISK for Coronavirus (COVID-19), you have to meet the following criteria:  . Traveled to [place], [place], [place], [place] or [place]; or in the [place] to [place], [place], [place]  . or [place]; and have fever, cough, and shortness of breath within the last 2 weeks of travel OR . Been in close contact with a person diagnosed with COVID-19 within the last 2 weeks and have  . fever, cough,and shortness of breath .  . IF YOU DO NOT MEET THESE CRITERIA, YOU ARE CONSIDERED LOW RISK FOR COVID-19.  What to do if you are HIGH RISK for COVID-19?  Marland Kitchen If you are having a medical emergency, call 911. . Seek medical care right away. Before you go to a doctor's office, urgent care or emergency department, .  call ahead and tell them about your recent travel, contact with someone diagnosed with COVID-19  .  and your symptoms.  . You should receive instructions from your physician's office regarding next steps of care.  . When you arrive at healthcare provider, tell the healthcare staff immediately you have returned from  . visiting [place], [place], [place], [place] or [place]; or traveled in the [place] to [place], [place],  . [place] or [place] in the last two weeks or you have been in close contact with a person diagnosed with  . COVID-19 in the last 2 weeks.   . Tell the health care staff about your symptoms: fever, cough and shortness of breath. . After you have been seen by a medical provider, you will be either: o Tested for (COVID-19) and discharged home on quarantine except to seek medical care if  o symptoms worsen, and asked to  - Stay home and avoid contact with others until you get your results (4-5 days)  - Avoid travel on public transportation if possible (such as bus, train, or airplane) or o Sent to the Emergency Department by EMS for evaluation,  COVID-19 testing  and  o possible admission depending on your condition and test results.  What to do if you are LOW RISK for COVID-19?  Reduce your risk of any infection by using the same precautions used for avoiding the common cold or flu:  Marland Kitchen Wash your hands often with soap and warm water for at least 20 seconds.  If soap and water are not readily available,  . use an alcohol-based hand sanitizer with at least 60% alcohol.  . If coughing or sneezing, cover your mouth and nose by coughing or sneezing into the elbow areas of your shirt or coat, .  into a tissue or into your sleeve (not your hands). . Avoid shaking hands with others and consider head nods or verbal greetings only. . Avoid touching your eyes, nose, or mouth with unwashed hands.  . Avoid close contact with people who are sick. . Avoid places or events with large numbers of people in one location, like concerts or sporting events. . Carefully consider travel plans you have or are making. . If you are planning any travel outside or inside the [place], visit the CDC's Travelers' Health webpage for the latest health notices. . If you have some symptoms but not all symptoms, continue to monitor at home and seek medical attention  . if your symptoms worsen. . If you are having a medical emergency, call 911.  +++++++++++++++++++++++++++++++++++++  Orthostatic Hypotension Blood pressure is a measurement of how strongly, or weakly, your blood is pressing against the walls of your arteries. Orthostatic hypotension is a sudden drop in blood pressure that happens when you quickly change positions, such as when you get up from sitting or lying down. Arteries are blood vessels that carry blood from your heart throughout your body. When blood pressure is too low, you may not get enough blood to your brain or to the rest of your organs. This can cause weakness, light-headedness, rapid heartbeat, and fainting. This can last for just a few seconds  or for up to a few minutes. Orthostatic hypotension is usually not a serious problem. However, if it happens frequently or gets worse, it may be a sign of something more serious. What are the causes? This condition may be caused by:  Sudden changes in posture, such as standing up quickly after you have been sitting or lying down.  Blood loss.  Loss of body fluids (dehydration).  Heart problems.  Hormone (endocrine) problems.  Pregnancy.  Severe infection.  Lack of certain nutrients.  Severe allergic reactions (anaphylaxis).  Certain medicines, such as blood pressure medicine or medicines that make the body lose excess fluids (diuretics). Sometimes, this condition can be caused by not taking medicine as directed, such as taking too much of a certain medicine. What increases the risk? The following factors may make you more likely to develop this condition:  Age. Risk increases as you get older.  Conditions that affect the heart or the central nervous system.  Taking certain medicines, such as blood pressure medicine or diuretics.  Being pregnant. What are the signs or symptoms? Symptoms of this condition may include:  Weakness.  Light-headedness.  Dizziness.  Blurred vision.  Fatigue.  Rapid heartbeat.  Fainting, in severe cases. How is this diagnosed? This condition is diagnosed based on:  Your medical history.  Your symptoms.  Your blood pressure measurement. Your health care provider will check your blood pressure when you are: ? Lying down. ? Sitting. ? Standing. A blood pressure reading is recorded as two numbers, such as "120 over 80" (or 120/80). The first ("top") number is called the systolic pressure. It is a measure of the pressure in your arteries as your heart beats. The second ("bottom") number is called the diastolic pressure. It is a measure of the pressure in your arteries when your heart relaxes between beats. Blood pressure is measured in a  unit called mm Hg. Healthy blood pressure for most adults is 120/80. If your blood pressure is below 90/60, you may be diagnosed with hypotension. Other information or tests that may be used to diagnose orthostatic hypotension include:  Your other vital signs, such as your heart rate and temperature.  Blood tests.  Tilt table test. For this test, you will be safely secured to a table that moves you from a lying position to an upright position. Your heart rhythm and blood pressure will be monitored during the test. How is this treated? This condition may be treated by:  Changing your diet. This may involve eating more salt (sodium) or drinking more water.  Taking medicines to raise your blood pressure.  Changing the dosage of certain medicines you are taking that might be lowering your blood pressure.  Wearing compression stockings. These stockings help to prevent blood clots and reduce swelling in your legs. In some cases, you may need to go to the hospital for:  Fluid replacement. This means you  will receive fluids through an IV.  Blood replacement. This means you will receive donated blood through an IV (transfusion).  Treating an infection or heart problems, if this applies.  Monitoring. You may need to be monitored while medicines that you are taking wear off. Follow these instructions at home: Eating and drinking   Drink enough fluid to keep your urine pale yellow.  Eat a healthy diet, and follow instructions from your health care provider about eating or drinking restrictions. A healthy diet includes: ? Fresh fruits and vegetables. ? Whole grains. ? Lean meats. ? Low-fat dairy products.  Eat extra salt only as directed. Do not add extra salt to your diet unless your health care provider told you to do that.  Eat frequent, small meals.  Avoid standing up suddenly after eating. Medicines  Take over-the-counter and prescription medicines only as told by your health  care provider. ? Follow instructions from your health care provider about changing the dosage of your current medicines, if this applies. ? Do not stop or adjust any of your medicines on your own. General instructions   Wear compression stockings as told by your health care provider.  Get up slowly from lying down or sitting positions. This gives your blood pressure a chance to adjust.  Avoid hot showers and excessive heat as directed by your health care provider.  Return to your normal activities as told by your health care provider. Ask your health care provider what activities are safe for you.  Do not use any products that contain nicotine or tobacco, such as cigarettes, e-cigarettes, and chewing tobacco. If you need help quitting, ask your health care provider.  Keep all follow-up visits as told by your health care provider. This is important. Contact a health care provider if you:  Vomit.  Have diarrhea.  Have a fever for more than 2-3 days.  Feel more thirsty than usual.  Feel weak and tired. Get help right away if you:  Have chest pain.  Have a fast or irregular heartbeat.  Develop numbness in any part of your body.  Cannot move your arms or your legs.  Have trouble speaking.  Become sweaty or feel light-headed.  Faint.  Feel short of breath.  Have trouble staying awake.  Feel confused. Summary  Orthostatic hypotension is a sudden drop in blood pressure that happens when you quickly change positions.  Orthostatic hypotension is usually not a serious problem.  It is diagnosed by having your blood pressure taken lying down, sitting, and then standing.  It may be treated by changing your diet or adjusting your medicines. This information is not intended to replace advice given to you by your health care provider. Make sure you discuss any questions you have with your health care provider. Document Released: 06/04/2002 Document Revised: 12/08/2017  Document Reviewed: 12/08/2017 Elsevier Interactive Patient Education  [employer].

## 2018-10-27 ENCOUNTER — Encounter: Payer: Self-pay | Admitting: Internal Medicine

## 2018-11-14 ENCOUNTER — Observation Stay (HOSPITAL_COMMUNITY)
Admission: EM | Admit: 2018-11-14 | Discharge: 2018-11-15 | Disposition: A | Discharge status: Home / Self Care | Payer: Medicare Other | Attending: Internal Medicine | Admitting: Internal Medicine

## 2018-11-14 ENCOUNTER — Emergency Department (HOSPITAL_COMMUNITY): Payer: Medicare Other

## 2018-11-14 ENCOUNTER — Other Ambulatory Visit: Payer: Self-pay

## 2018-11-14 ENCOUNTER — Encounter (HOSPITAL_COMMUNITY): Payer: Self-pay | Admitting: Emergency Medicine

## 2018-11-14 DIAGNOSIS — R404 Transient alteration of awareness: Secondary | ICD-10-CM | POA: Diagnosis not present

## 2018-11-14 DIAGNOSIS — E785 Hyperlipidemia, unspecified: Secondary | ICD-10-CM | POA: Diagnosis not present

## 2018-11-14 DIAGNOSIS — R410 Disorientation, unspecified: Secondary | ICD-10-CM | POA: Diagnosis not present

## 2018-11-14 DIAGNOSIS — Z853 Personal history of malignant neoplasm of breast: Secondary | ICD-10-CM | POA: Insufficient documentation

## 2018-11-14 DIAGNOSIS — I951 Orthostatic hypotension: Secondary | ICD-10-CM

## 2018-11-14 DIAGNOSIS — Z79899 Other long term (current) drug therapy: Secondary | ICD-10-CM | POA: Diagnosis not present

## 2018-11-14 DIAGNOSIS — Z8639 Personal history of other endocrine, nutritional and metabolic disease: Secondary | ICD-10-CM | POA: Diagnosis not present

## 2018-11-14 DIAGNOSIS — G454 Transient global amnesia: Principal | ICD-10-CM | POA: Insufficient documentation

## 2018-11-14 DIAGNOSIS — I6621 Occlusion and stenosis of right posterior cerebral artery: Secondary | ICD-10-CM | POA: Diagnosis not present

## 2018-11-14 DIAGNOSIS — R413 Other amnesia: Secondary | ICD-10-CM | POA: Diagnosis not present

## 2018-11-14 DIAGNOSIS — Z20828 Contact with and (suspected) exposure to other viral communicable diseases: Secondary | ICD-10-CM | POA: Insufficient documentation

## 2018-11-14 DIAGNOSIS — F329 Major depressive disorder, single episode, unspecified: Secondary | ICD-10-CM | POA: Insufficient documentation

## 2018-11-14 DIAGNOSIS — I10 Essential (primary) hypertension: Secondary | ICD-10-CM | POA: Diagnosis not present

## 2018-11-14 DIAGNOSIS — R4182 Altered mental status, unspecified: Secondary | ICD-10-CM | POA: Diagnosis present

## 2018-11-14 DIAGNOSIS — R51 Headache: Secondary | ICD-10-CM | POA: Diagnosis not present

## 2018-11-14 HISTORY — DX: Transient global amnesia: G45.4

## 2018-11-14 HISTORY — DX: Orthostatic hypotension: I95.1

## 2018-11-14 LAB — DIFFERENTIAL
Abs Immature Granulocytes: 0.04 10*3/uL (ref 0.00–0.07)
Basophils Absolute: 0 10*3/uL (ref 0.0–0.1)
Basophils Relative: 0 %
Eosinophils Absolute: 0 10*3/uL (ref 0.0–0.5)
Eosinophils Relative: 0 %
Immature Granulocytes: 0 %
Lymphocytes Relative: 17 %
Lymphs Abs: 2 10*3/uL (ref 0.7–4.0)
Monocytes Absolute: 0.7 10*3/uL (ref 0.1–1.0)
Monocytes Relative: 6 %
Neutro Abs: 8.8 10*3/uL — ABNORMAL HIGH (ref 1.7–7.7)
Neutrophils Relative %: 77 %

## 2018-11-14 LAB — URINALYSIS, ROUTINE W REFLEX MICROSCOPIC
Bilirubin Urine: NEGATIVE
Glucose, UA: NEGATIVE mg/dL
Hgb urine dipstick: NEGATIVE
Ketones, ur: NEGATIVE mg/dL
Leukocytes,Ua: NEGATIVE
Nitrite: NEGATIVE
Protein, ur: NEGATIVE mg/dL
Specific Gravity, Urine: 1.017 (ref 1.005–1.030)
pH: 5 (ref 5.0–8.0)

## 2018-11-14 LAB — COMPREHENSIVE METABOLIC PANEL
ALT: 26 U/L (ref 0–44)
AST: 28 U/L (ref 15–41)
Albumin: 4.5 g/dL (ref 3.5–5.0)
Alkaline Phosphatase: 105 U/L (ref 38–126)
Anion gap: 11 (ref 5–15)
BUN: 22 mg/dL (ref 8–23)
CO2: 25 mmol/L (ref 22–32)
Calcium: 9.9 mg/dL (ref 8.9–10.3)
Chloride: 103 mmol/L (ref 98–111)
Creatinine, Ser: 1.01 mg/dL — ABNORMAL HIGH (ref 0.44–1.00)
GFR calc Af Amer: >60 mL/min (ref >60)
GFR calc non Af Amer: 53 mL/min — ABNORMAL LOW (ref >60)
Glucose, Bld: 116 mg/dL — ABNORMAL HIGH (ref 70–99)
Potassium: 4.3 mmol/L (ref 3.5–5.1)
Sodium: 139 mmol/L (ref 135–145)
Total Bilirubin: 0.9 mg/dL (ref 0.3–1.2)
Total Protein: 7.3 g/dL (ref 6.5–8.1)

## 2018-11-14 LAB — CBC
HCT: 45.9 % (ref 36.0–46.0)
Hemoglobin: 14.8 g/dL (ref 12.0–15.0)
MCH: 29 pg (ref 26.0–34.0)
MCHC: 32.2 g/dL (ref 30.0–36.0)
MCV: 89.8 fL (ref 80.0–100.0)
Platelets: 342 10*3/uL (ref 150–400)
RBC: 5.11 MIL/uL (ref 3.87–5.11)
RDW: 13.4 % (ref 11.5–15.5)
WBC: 11.7 10*3/uL — ABNORMAL HIGH (ref 4.0–10.5)
nRBC: 0 % (ref 0.0–0.2)

## 2018-11-14 LAB — I-STAT CHEM 8, ED
BUN: 24 mg/dL — ABNORMAL HIGH (ref 8–23)
Calcium, Ion: 1.11 mmol/L — ABNORMAL LOW (ref 1.15–1.40)
Chloride: 105 mmol/L (ref 98–111)
Creatinine, Ser: 1 mg/dL (ref 0.44–1.00)
Glucose, Bld: 112 mg/dL — ABNORMAL HIGH (ref 70–99)
HCT: 46 % (ref 36.0–46.0)
Hemoglobin: 15.6 g/dL — ABNORMAL HIGH (ref 12.0–15.0)
Potassium: 4.3 mmol/L (ref 3.5–5.1)
Sodium: 139 mmol/L (ref 135–145)
TCO2: 25 mmol/L (ref 22–32)

## 2018-11-14 LAB — APTT: aPTT: 29 seconds (ref 24–36)

## 2018-11-14 LAB — CBG MONITORING, ED
Glucose-Capillary: 106 mg/dL — ABNORMAL HIGH (ref 70–99)
Glucose-Capillary: 111 mg/dL — ABNORMAL HIGH (ref 70–99)

## 2018-11-14 LAB — PROTIME-INR
INR: 1 (ref 0.8–1.2)
Prothrombin Time: 13.4 seconds (ref 11.4–15.2)

## 2018-11-14 LAB — SARS CORONAVIRUS 2 BY RT PCR (HOSPITAL ORDER, PERFORMED IN ~~LOC~~ HOSPITAL LAB): SARS Coronavirus 2: NEGATIVE

## 2018-11-14 MED ORDER — SODIUM CHLORIDE 0.9% FLUSH: 3.0000 mL | Freq: Once | INTRAVENOUS | Status: DC

## 2018-11-14 MED ORDER — IOHEXOL 350 MG/ML SOLN
75.0000 mL | Freq: Once | INTRAVENOUS | Status: AC | PRN
  Administered 2018-11-14: 19:00:00 75 mL via INTRAVENOUS

## 2018-11-14 MED ORDER — LABETALOL HCL 5 MG/ML IV SOLN
10.0000 mg | Freq: Once | INTRAVENOUS | Status: AC
  Administered 2018-11-14: 23:00:00 10 mg via INTRAVENOUS
  Filled 2018-11-14: qty 4
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
## 2018-11-14 NOTE — ED Notes (Signed)
Husband: Leona Singleton  [phone]  Son: Nastasha Reising  [phone]
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
## 2018-11-14 NOTE — ED Notes (Signed)
ED TO INPATIENT HANDOFF REPORT  ED Nurse Name and Phone #:  Clydene Laming [phone]  S Name/Age/Gender Gwenith Spitz 78 y.o. female Room/Bed: 015C/015C  Code Status   Code Status: Not on file  Home/SNF/Other Home Patient oriented to: self and place Is this baseline? No   Triage Complete: Triage complete  Chief Complaint Code Stroke  Triage Note Pt BIB EMS with c/o confusion. Husband reported to EMS pt became confused after receiving a disturbing phone call in regards to her grandson. LSN today at 1615.  Pt has no memory of events that occurred today. Pt has no other complaints.  Denies any weakness.     Allergies Allergies  Allergen Reactions  . Codeine     Level of Care/Admitting Diagnosis ED Disposition    ED Disposition Condition [facility] Area: [place] [[id number]]  Level of Care: Telemetry Medical [104]  I expect the patient will be discharged within 24 hours: Yes  LOW acuity---Tx typically complete <24 hrs---ACUTE conditions typically can be evaluated <24 hours---LABS likely to return to acceptable levels <24 hours---IS near functional baseline---EXPECTED to return to current living arrangement---NOT newly hypoxic: Meets criteria for 5C-Observation unit  Covid Evaluation: N/A  Diagnosis: Transient global amnesia [437.7.ICD-9-CM]  Admitting Physician: Lenore Cordia [[id number]]  Attending Physician: Lenore Cordia [[id number]]  PT Class (Do Not Modify): Observation [104]  PT Acc Code (Do Not Modify): Observation [10022]       B Medical/Surgery History Past Medical History:  Diagnosis Date  . Allergy   . Cancer of right breast ([facility]) 1992  . Depression   . Hyperlipidemia   . Hypertension   . Vitamin D deficiency    Past Surgical History:  Procedure Laterality Date  . ABDOMINAL HYSTERECTOMY     partial  . BREAST LUMPECTOMY Right   . CATARACT EXTRACTION, BILATERAL Bilateral 2016  . TONSILLECTOMY AND ADENOIDECTOMY        A IV Location/Drains/Wounds Patient Lines/Drains/Airways Status   Active Line/Drains/Airways    Name:   Placement date:   Placement time:   Site:   Days:   Peripheral IV 11/14/18 Left Antecubital   11/14/18    1914    Antecubital   less than 1   Peripheral IV 11/14/18 Right Antecubital   11/14/18    1914    Antecubital   less than 1          Intake/Output Last 24 hours No intake or output data in the 24 hours ending 11/14/18 2312  Labs/Imaging Results for orders placed or performed during the hospital encounter of 11/14/18 (from the past 48 hour(s))  CBG monitoring, ED     Status: Abnormal   Collection Time: 11/14/18  6:43 PM  Result Value Ref Range   Glucose-Capillary 111 (H) 70 - 99 mg/dL  CBG monitoring, ED     Status: Abnormal   Collection Time: 11/14/18  6:45 PM  Result Value Ref Range   Glucose-Capillary 106 (H) 70 - 99 mg/dL  CBC     Status: Abnormal   Collection Time: 11/14/18  6:46 PM  Result Value Ref Range   WBC 11.7 (H) 4.0 - 10.5 K/uL   RBC 5.11 3.87 - 5.11 MIL/uL   Hemoglobin 14.8 12.0 - 15.0 g/dL   HCT 45.9 36.0 - 46.0 %   MCV 89.8 80.0 - 100.0 fL   MCH 29.0 26.0 - 34.0 pg   MCHC 32.2 30.0 - 36.0 g/dL   RDW  13.4 11.5 - 15.5 %   Platelets 342 150 - 400 K/uL   nRBC 0.0 0.0 - 0.2 %    Comment: Performed at [facility], [place] [address], [place], [place] [address]  Differential     Status: Abnormal   Collection Time: 11/14/18  6:46 PM  Result Value Ref Range   Neutrophils Relative % 77 %   Neutro Abs 8.8 (H) 1.7 - 7.7 K/uL   Lymphocytes Relative 17 %   Lymphs Abs 2.0 0.7 - 4.0 K/uL   Monocytes Relative 6 %   Monocytes Absolute 0.7 0.1 - 1.0 K/uL   Eosinophils Relative 0 %   Eosinophils Absolute 0.0 0.0 - 0.5 K/uL   Basophils Relative 0 %   Basophils Absolute 0.0 0.0 - 0.1 K/uL   Immature Granulocytes 0 %   Abs Immature Granulocytes 0.04 0.00 - 0.07 K/uL    Comment: Performed at [facility] [address] [place], [place] [address]   Comprehensive metabolic panel     Status: Abnormal   Collection Time: 11/14/18  6:46 PM  Result Value Ref Range   Sodium 139 135 - 145 mmol/L   Potassium 4.3 3.5 - 5.1 mmol/L   Chloride 103 98 - 111 mmol/L   CO2 25 22 - 32 mmol/L   Glucose, Bld 116 (H) 70 - 99 mg/dL   BUN 22 8 - 23 mg/dL   Creatinine, Ser 1.01 (H) 0.44 - 1.00 mg/dL   Calcium 9.9 8.9 - 10.3 mg/dL   Total Protein 7.3 6.5 - 8.1 g/dL   Albumin 4.5 3.5 - 5.0 g/dL   AST 28 15 - 41 U/L   ALT 26 0 - 44 U/L   Alkaline Phosphatase 105 38 - 126 U/L   Total Bilirubin 0.9 0.3 - 1.2 mg/dL   GFR calc non Af Amer 53 (L) >60 mL/min   GFR calc Af Amer >60 >60 mL/min   Anion gap 11 5 - 15    Comment: Performed at [facility] [address] [place], [place] [address]  I-stat chem 8, ED [facility] only)     Status: Abnormal   Collection Time: 11/14/18  6:51 PM  Result Value Ref Range   Sodium 139 135 - 145 mmol/L   Potassium 4.3 3.5 - 5.1 mmol/L   Chloride 105 98 - 111 mmol/L   BUN 24 (H) 8 - 23 mg/dL   Creatinine, Ser 1.00 0.44 - 1.00 mg/dL   Glucose, Bld 112 (H) 70 - 99 mg/dL   Calcium, Ion 1.11 (L) 1.15 - 1.40 mmol/L   TCO2 25 22 - 32 mmol/L   Hemoglobin 15.6 (H) 12.0 - 15.0 g/dL   HCT 46.0 36.0 - 46.0 %  Urinalysis, Routine w reflex microscopic     Status: Abnormal   Collection Time: 11/14/18  7:10 PM  Result Value Ref Range   Color, Urine STRAW (A) YELLOW   APPearance CLEAR CLEAR   Specific Gravity, Urine 1.017 1.005 - 1.030   pH 5.0 5.0 - 8.0   Glucose, UA NEGATIVE NEGATIVE mg/dL   Hgb urine dipstick NEGATIVE NEGATIVE   Bilirubin Urine NEGATIVE NEGATIVE   Ketones, ur NEGATIVE NEGATIVE mg/dL   Protein, ur NEGATIVE NEGATIVE mg/dL   Nitrite NEGATIVE NEGATIVE   Leukocytes,Ua NEGATIVE NEGATIVE    Comment: Performed at [facility], [place] [address] [place], [place] [address]  APTT     Status: None   Collection Time: 11/14/18  7:20 PM  Result Value Ref Range   aPTT 29 24 - 36 seconds    Comment:  Performed at [facility], [place] [address], [place], [place] [address]  Protime-INR     Status: None   Collection Time: 11/14/18  7:20 PM  Result Value Ref Range   Prothrombin Time 13.4 11.4 - 15.2 seconds   INR 1.0 0.8 - 1.2    Comment: (NOTE) INR goal varies based on device and disease states. Performed at [facility], [place] [address], [place], [place] [address]   SARS Coronavirus 2 (CEPHEID - Performed in [facility] lab), Hosp Order     Status: None   Collection Time: 11/14/18  7:45 PM  Result Value Ref Range   SARS Coronavirus 2 NEGATIVE NEGATIVE    Comment: (NOTE) If result is NEGATIVE SARS-CoV-2 target nucleic acids are NOT DETECTED. The SARS-CoV-2 RNA is generally detectable in upper and lower  respiratory specimens during the acute phase of infection. The lowest  concentration of SARS-CoV-2 viral copies this assay can detect is 250  copies / mL. A negative result does not preclude SARS-CoV-2 infection  and should not be used as the sole basis for treatment or other  patient management decisions.  A negative result may occur with  improper specimen collection / handling, submission of specimen other  than nasopharyngeal swab, presence of viral mutation(s) within the  areas targeted by this assay, and inadequate number of viral copies  (<250 copies / mL). A negative result must be combined with clinical  observations, patient history, and epidemiological information. If result is POSITIVE SARS-CoV-2 target nucleic acids are DETECTED. The SARS-CoV-2 RNA is generally detectable in upper and lower  respiratory specimens dur ing the acute phase of infection.  Positive  results are indicative of active infection with SARS-CoV-2.  Clinical  correlation with patient history and other diagnostic information is  necessary to determine patient infection status.  Positive results do  not rule out bacterial infection or co-infection with other viruses. If result  is PRESUMPTIVE POSTIVE SARS-CoV-2 nucleic acids MAY BE PRESENT.   A presumptive positive result was obtained on the submitted specimen  and confirmed on repeat testing.  While 2019 novel coronavirus  (SARS-CoV-2) nucleic acids may be present in the submitted sample  additional confirmatory testing may be necessary for epidemiological  and / or clinical management purposes  to differentiate between  SARS-CoV-2 and other Sarbecovirus currently known to infect humans.  If clinically indicated additional testing with an alternate test  methodology [phone]) is advised. The SARS-CoV-2 RNA is generally  detectable in upper and lower respiratory sp ecimens during the acute  phase of infection. The expected result is Negative. Fact Sheet for Patients:  StrictlyIdeas.no Fact Sheet for Healthcare Providers: BankingDealers.co.za This test is not yet approved or cleared by the [place] FDA and has been authorized for detection and/or diagnosis of SARS-CoV-2 by FDA under an Emergency Use Authorization (EUA).  This EUA will remain in effect (meaning this test can be used) for the duration of the COVID-19 declaration under Section 564(b)(1) of the Act, 21 U.S.C. section 360bbb-3(b)(1), unless the authorization is terminated or revoked sooner. Performed at [facility], [place] [address], [place], [place] [address]    Ct Angio Head W Or Wo Contrast  Result Date: 11/14/2018 CLINICAL DATA:  78 year old female code stroke. Confusion, repetitive speech. EXAM: CT ANGIOGRAPHY HEAD AND NECK TECHNIQUE: Multidetector CT imaging of the head and  neck was performed using the standard protocol during bolus administration of intravenous contrast. Multiplanar CT image reconstructions and MIPs were obtained to evaluate the vascular anatomy. Carotid stenosis measurements (when applicable) are obtained utilizing NASCET criteria, using the distal internal carotid  diameter as the denominator. CONTRAST:  31mL OMNIPAQUE IOHEXOL 350 MG/ML SOLN COMPARISON:  Plain head CT 1850 hours today. FINDINGS: CTA NECK Skeleton: Degenerative changes in the cervical spine including mild multilevel spondylolisthesis. No acute osseous abnormality identified. Upper chest: Mild mosaic attenuation in the upper lungs probably reflecting a combination of mild atelectasis and gas trapping. Other neck: Negative, no neck mass or lymphadenopathy identified. Aortic arch: 3 vessel arch configuration. Mild to moderate Calcified aortic atherosclerosis. Visible central pulmonary arteries also appear patent. Right carotid system: Negative aside from mild tortuosity and minimal plaque. Left carotid system: Negative aside from tortuosity and minimal plaque. Vertebral arteries: No proximal right subclavian or right vertebral artery origin stenosis. Dominant and mildly dolichoectatic right vertebral artery is widely patent to the skull base. There is moderate proximal left subclavian artery plaque including mural soft plaque on series 7, image 290. No hemodynamically significant stenosis. The left vertebral artery origin is unaffected. The left V1 segment is tortuous. The left vertebral is non dominant but still has a normal or even mildly ectatic caliber. No stenosis to the skull base. CTA HEAD Posterior circulation: Dominant and mildly dolichoectatic right V4 segment. Normal right PICA origin. The left AICA appears dominant. There is no distal vertebral plaque or stenosis. Patent basilar artery without plaque or stenosis. Patent AICA, SCA and PCA origins. Posterior communicating arteries are diminutive or absent. The left PCA is within normal limits. There is mild to moderate irregularity and mild stenosis in the right P1 and P2 (series 10, image 20) with preserved distal enhancement. Anterior circulation: Both ICA siphons are patent. Mild calcified plaque on the left without stenosis. Minimal calcified plaque  on the right without stenosis. Patent carotid termini. Normal MCA and ACA origins. Anterior communicating artery and bilateral ACA branches are within normal limits. Right MCA M1 and bifurcation are patent without stenosis. Right MCA branches are within normal limits. Left MCA M1 is mildly tortuous but patent without stenosis. Left MCA bifurcation is patent without stenosis. Left MCA branches are within normal limits. Venous sinuses: Patent. Anatomic variants: Dominant right vertebral artery with mild-to-moderate dolichoectasia. Review of the MIP images confirms the above findings IMPRESSION: 1. Negative for large vessel occlusion. 2. Mild to moderate irregularity and mild stenosis of the right PCA P1 and P2 segments. But otherwise minimal for age atherosclerosis in the head and neck, and no other stenosis. There is mild-to-moderate arterial tortuosity. 3. There is more typical for age arch Aortic atherosclerosis (ICD10-I70.0, and also soft plaque in the proximal left subclavian artery. 4. Preliminary findings were discussed by telephone with Dr. Amie Portland on 11/14/2018 at 1906 hours. Electronically Signed   By: Genevie Ann M.D.   On: 11/14/2018 19:19   Ct Angio Neck W Or Wo Contrast  Result Date: 11/14/2018 CLINICAL DATA:  78 year old female code stroke. Confusion, repetitive speech. EXAM: CT ANGIOGRAPHY HEAD AND NECK TECHNIQUE: Multidetector CT imaging of the head and neck was performed using the standard protocol during bolus administration of intravenous contrast. Multiplanar CT image reconstructions and MIPs were obtained to evaluate the vascular anatomy. Carotid stenosis measurements (when applicable) are obtained utilizing NASCET criteria, using the distal internal carotid diameter as the denominator. CONTRAST:  72mL OMNIPAQUE IOHEXOL 350 MG/ML SOLN COMPARISON:  Plain  head CT 1850 hours today. FINDINGS: CTA NECK Skeleton: Degenerative changes in the cervical spine including mild multilevel  spondylolisthesis. No acute osseous abnormality identified. Upper chest: Mild mosaic attenuation in the upper lungs probably reflecting a combination of mild atelectasis and gas trapping. Other neck: Negative, no neck mass or lymphadenopathy identified. Aortic arch: 3 vessel arch configuration. Mild to moderate Calcified aortic atherosclerosis. Visible central pulmonary arteries also appear patent. Right carotid system: Negative aside from mild tortuosity and minimal plaque. Left carotid system: Negative aside from tortuosity and minimal plaque. Vertebral arteries: No proximal right subclavian or right vertebral artery origin stenosis. Dominant and mildly dolichoectatic right vertebral artery is widely patent to the skull base. There is moderate proximal left subclavian artery plaque including mural soft plaque on series 7, image 290. No hemodynamically significant stenosis. The left vertebral artery origin is unaffected. The left V1 segment is tortuous. The left vertebral is non dominant but still has a normal or even mildly ectatic caliber. No stenosis to the skull base. CTA HEAD Posterior circulation: Dominant and mildly dolichoectatic right V4 segment. Normal right PICA origin. The left AICA appears dominant. There is no distal vertebral plaque or stenosis. Patent basilar artery without plaque or stenosis. Patent AICA, SCA and PCA origins. Posterior communicating arteries are diminutive or absent. The left PCA is within normal limits. There is mild to moderate irregularity and mild stenosis in the right P1 and P2 (series 10, image 20) with preserved distal enhancement. Anterior circulation: Both ICA siphons are patent. Mild calcified plaque on the left without stenosis. Minimal calcified plaque on the right without stenosis. Patent carotid termini. Normal MCA and ACA origins. Anterior communicating artery and bilateral ACA branches are within normal limits. Right MCA M1 and bifurcation are patent without  stenosis. Right MCA branches are within normal limits. Left MCA M1 is mildly tortuous but patent without stenosis. Left MCA bifurcation is patent without stenosis. Left MCA branches are within normal limits. Venous sinuses: Patent. Anatomic variants: Dominant right vertebral artery with mild-to-moderate dolichoectasia. Review of the MIP images confirms the above findings IMPRESSION: 1. Negative for large vessel occlusion. 2. Mild to moderate irregularity and mild stenosis of the right PCA P1 and P2 segments. But otherwise minimal for age atherosclerosis in the head and neck, and no other stenosis. There is mild-to-moderate arterial tortuosity. 3. There is more typical for age arch Aortic atherosclerosis (ICD10-I70.0, and also soft plaque in the proximal left subclavian artery. 4. Preliminary findings were discussed by telephone with Dr. Amie Portland on 11/14/2018 at 1906 hours. Electronically Signed   By: Genevie Ann M.D.   On: 11/14/2018 19:19   Ct Head Code Stroke Wo Contrast  Result Date: 11/14/2018 CLINICAL DATA:  Code stroke. 78 year old female with headache and confusion. Last seen normal 1650 hours. EXAM: CT HEAD WITHOUT CONTRAST TECHNIQUE: Contiguous axial images were obtained from the base of the skull through the vertex without intravenous contrast. COMPARISON:  None. FINDINGS: Brain: Cerebral volume is within normal limits for age. No midline shift, mass effect, or evidence of intracranial mass lesion. No ventriculomegaly. No acute intracranial hemorrhage identified. Normal for age gray-white matter differentiation throughout the brain. No cortically based acute infarct identified. Vascular: Calcified atherosclerosis at the skull base. Questionable asymmetric hyperdensity of the left MCA M1, such as on sagittal image 34. Skull: No acute osseous abnormality identified. Sinuses/Orbits: Visualized paranasal sinuses and mastoids are clear. Other: No acute orbit or scalp soft tissue finding. ASPECTS [facility]  Stroke Program Early CT Score)  Total score (0-10 with 10 being normal): 10 IMPRESSION: 1. Questionable asymmetric density of the left MCA M1 segment. 2. But otherwise negative for age Normal noncontrast CT appearance of the brain. ASPECTS 10. 3. These results were discussed with Dr. Rory Percy at 6:56 pm on 11/14/2018. Electronically Signed   By: Genevie Ann M.D.   On: 11/14/2018 18:58    Pending Labs Unresulted Labs (From admission, onward)   None      Vitals/Pain Today's Vitals   11/14/18 2115 11/14/18 2240 11/14/18 2245 11/14/18 2245  BP: (!) 134/122 (!) 177/101 (!) 146/95   Pulse: 78 76 70   Resp: (!) 23 18 (!) 22   Temp:      TempSrc:      SpO2: 95% 99% 93%   PainSc:  0-No pain  0-No pain    Isolation Precautions No active isolations  Medications Medications  sodium chloride flush (NS) 0.9 % injection 3 mL (3 mLs Intravenous Not Given 11/14/18 1941)  iohexol (OMNIPAQUE) 350 MG/ML injection 75 mL (75 mLs Intravenous Contrast Given 11/14/18 1857)  labetalol (NORMODYNE) injection 10 mg (10 mg Intravenous Given 11/14/18 2242)    Mobility walks High fall risk   Focused Assessments FAMILY NOTIFIED ON PT.'S ADMISSION AND PLAN OF CARE .   R Recommendations: See Admitting Provider Note  Report given to:   Additional Notes:

## 2018-11-14 NOTE — ED Notes (Signed)
Patient transported to MRI 

## 2018-11-14 NOTE — ED Notes (Addendum)
Assumed care on pt. , pt. remained disoriented to time , place and situation with repetitive questioning , MRI notified that pt. Is ready for transport , denies pain/respirations unlabored, IV sites intact. EDP notified on pt.'s hypertension .

## 2018-11-14 NOTE — ED Notes (Signed)
Patient currently at MRI

## 2018-11-14 NOTE — Code Documentation (Signed)
Code Stroke called enroute at 1818.  Last known well at 1615.  She is confused and asking repeative questions and telling the same story on repeat.  Arrived via EMS at [facility]  Stat labs and head CT done.  CTA done.  NIHSS 2. She is Alert and Oriented x 2.  Can not state her age or what month it is.  Dr Rory Percy at bedside to assess patient.   Hand off with ED RN    Neuro checks and VS q 2 hours

## 2018-11-14 NOTE — ED Notes (Signed)
Pt yelling from Room. Pt very confused and agitated as to why she is here, why her husband isn't here, why he can't come back and consistently repeating all of these questions. While in with Pt her husband called. Pt talking to husband currently.

## 2018-11-14 NOTE — H&P (Signed)
History and Physical    Tiffany Vaughn:[id number] DOB: [birth year] DOA: 11/14/2018  PCP: Unk Pinto, MD  Patient coming from: Her son's house  I have personally briefly reviewed patient's old medical records in [place]  Chief Complaint: Memory loss  HPI: Tiffany QUIZHPI is a 78 y.o. female with medical history significant for hypertension and hyperlipidemia who presents to the ED with new onset of memory loss.  Patient does not remember the events of the day therefore majority of history is obtained from her husband Tiffany Vaughn by phone, EDP, and chart review.  Patient was apparently in her usual state of health.  She was at her house near [place] when she received a distressing phone call stating that her grandson was in trouble and that someone was demanding $90,000.  She left her home and began to drive to her son's house in [place].  She got caught in heavy rain fall and came to become confused.  She called her husband and stated that she was lost.  He instructed her to find a place to park and she was able to direct him to [facility] parking lot where she was waiting.  At the time of her husband's arrival he noticed that she cannot remember any recent events of the day however was able to speak to him fluently.  He says she was reiterating the same questions and phrases over and over.  He did not notice any slurred speech, facial droop, or weakness in any of her extremities.  They drove to her son's house and EMS were subsequently called.  Patient does report losing her daughter at age 59 due to a drowning accident which was a memory brought up today.  Of note she has been on Toprol chronically for blood pressure which she says was initially started to treat headaches.  She was recently taken off Toprol due to finding of postural hypotension by her PCP.  ED Course:  Initial vitals showed BP 184/81, pulse 86, RR 18, temp 98.3 Fahrenheit, SPO2 99% on room  air.  Labs are notable for WBC 11.7, hemoglobin 14.8, platelets 342,000, sodium 139, potassium 4.3, BUN 22, creatinine 1.01, urinalysis was negative for UTI.  SARS-CoV-2 test was negative.  CT head code stroke study showed a questionable asymmetric density of the left MCA M1 segment.  Neurology were consulted and recommended CTA head and neck which were negative for large vessel occlusion.  Mild to moderate irregularity of mild stenosis of right PCA P1 and P2 segments was noted.  Patient was given IV labetalol 10 mg once.  Neurology recommended medical admission for evaluation of CVA versus TGA and MRI brain was ordered and pending.  The hospitalist service was consulted for further evaluation and management.  Review of Systems: All systems reviewed and are negative except as documented in history of present illness above.   Past Medical History:  Diagnosis Date   Allergy    Cancer of right breast ([place]) 1992   Depression    Hyperlipidemia    Hypertension    Vitamin D deficiency     Past Surgical History:  Procedure Laterality Date   ABDOMINAL HYSTERECTOMY     partial   BREAST LUMPECTOMY Right    CATARACT EXTRACTION, BILATERAL Bilateral 2016   TONSILLECTOMY AND ADENOIDECTOMY      Social History:  reports that she has never smoked. She has never used smokeless tobacco. She reports that she does not drink alcohol or use drugs.  Allergies  Allergen Reactions   Codeine     Family History  Problem Relation Age of Onset   Heart attack Mother 76   Heart attack Father 16   Leukemia Brother      Prior to Admission medications   Medication Sig Start Date End Date Taking? Authorizing Provider  Ascorbic Acid (VITAMIN C PO) Take by mouth daily.    [provider]  ASPIRIN PO Take 81 mg by mouth daily.    [provider]  atorvastatin (LIPITOR) 20 MG tablet Take 1 tablet (20 mg total) by mouth at bedtime. 09/06/18   Unk Pinto, MD  azelastine  (OPTIVAR) 0.05 % ophthalmic solution Place 1 drop into both eyes 2 (two) times daily as needed. 01/23/18   Valentina Shaggy, MD  Cholecalciferol (VITAMIN D PO) Take 5,000 Units by mouth daily.     [provider]  ezetimibe (ZETIA) 10 MG tablet Take 1 tablet (10 mg total) by mouth daily. 09/06/18 09/06/19  Unk Pinto, MD  fludrocortisone (FLORINEF) 0.1 MG tablet Take 1 tablet 2 x /day for Low  BP Patient taking differently: Take 1 tablet 2 x /day for Low  BP as needed 10/17/18   Unk Pinto, MD  Omega-3 Fatty Acids (FISH OIL PO) Take by mouth daily.    [provider]    Physical Exam: Vitals:   11/14/18 2115 11/14/18 2240 11/14/18 2245 11/14/18 2315  BP: (!) 134/122 (!) 177/101 (!) 146/95 (!) 159/86  Pulse: 78 76 70 72  Resp: (!) 23 18 (!) 22 (!) 23  Temp:      TempSrc:      SpO2: 95% 99% 93% 96%    Constitutional: Resting supine in bed, NAD, calm, comfortable Eyes: PERRL, EOMI, lids and conjunctivae normal ENMT: Mucous membranes are moist. Posterior pharynx clear of any exudate or lesions.Normal dentition.  Neck: normal, supple, no masses. Respiratory: clear to auscultation bilaterally, no wheezing, no crackles. Normal respiratory effort. No accessory muscle use.  Cardiovascular: Regular rate and rhythm, no murmurs / rubs / gallops. No extremity edema. 2+ pedal pulses. Abdomen: no tenderness, no masses palpated. No hepatosplenomegaly. Bowel sounds positive.  Musculoskeletal: no clubbing / cyanosis. No joint deformity upper and lower extremities. Good ROM, no contractures. Normal muscle tone.  Skin: no rashes, lesions, ulcers. No induration Neurologic: CN 2-12 grossly intact. Sensation intact, DTR normal. Strength 5/5 in all 4.  Psychiatric: Alert and oriented to self and place but not time.  She is unable to recall the events of 11/14/2018 except that she made breakfast in the morning.  Long-term memory is intact.    Labs on Admission: I have personally  reviewed following labs and imaging studies  CBC: Recent Labs  Lab 11/14/18 1846 11/14/18 1851  WBC 11.7*  --   NEUTROABS 8.8*  --   HGB 14.8 15.6*  HCT 45.9 46.0  MCV 89.8  --   PLT 342  --    Basic Metabolic Panel: Recent Labs  Lab 11/14/18 1846 11/14/18 1851  NA 139 139  K 4.3 4.3  CL 103 105  CO2 25  --   GLUCOSE 116* 112*  BUN 22 24*  CREATININE 1.01* 1.00  CALCIUM 9.9  --    GFR: CrCl cannot be calculated (Unknown ideal weight.). Liver Function Tests: Recent Labs  Lab 11/14/18 1846  AST 28  ALT 26  ALKPHOS 105  BILITOT 0.9  PROT 7.3  ALBUMIN 4.5   No results for input(s): LIPASE, AMYLASE in the last  168 hours. No results for input(s): AMMONIA in the last 168 hours. Coagulation Profile: Recent Labs  Lab 11/14/18 1920  INR 1.0   Cardiac Enzymes: No results for input(s): CKTOTAL, CKMB, CKMBINDEX, TROPONINI in the last 168 hours. BNP (last 3 results) No results for input(s): PROBNP in the last 8760 hours. HbA1C: No results for input(s): HGBA1C in the last 72 hours. CBG: Recent Labs  Lab 11/14/18 1843 11/14/18 1845  GLUCAP 111* 106*   Lipid Profile: No results for input(s): CHOL, HDL, LDLCALC, TRIG, CHOLHDL, LDLDIRECT in the last 72 hours. Thyroid Function Tests: No results for input(s): TSH, T4TOTAL, FREET4, T3FREE, THYROIDAB in the last 72 hours. Anemia Panel: No results for input(s): VITAMINB12, FOLATE, FERRITIN, TIBC, IRON, RETICCTPCT in the last 72 hours. Urine analysis:    Component Value Date/Time   COLORURINE STRAW (A) 11/14/2018 1910   APPEARANCEUR CLEAR 11/14/2018 1910   LABSPEC 1.017 11/14/2018 1910   PHURINE 5.0 11/14/2018 1910   GLUCOSEU NEGATIVE 11/14/2018 1910   HGBUR NEGATIVE 11/14/2018 1910   BILIRUBINUR NEGATIVE 11/14/2018 1910   KETONESUR NEGATIVE 11/14/2018 1910   PROTEINUR NEGATIVE 11/14/2018 1910   UROBILINOGEN 0.2 12/23/2014 1033   NITRITE NEGATIVE 11/14/2018 1910   LEUKOCYTESUR NEGATIVE 11/14/2018 1910     Radiological Exams on Admission: Ct Angio Head W Or Wo Contrast  Result Date: 11/14/2018 CLINICAL DATA:  78 year old female code stroke. Confusion, repetitive speech. EXAM: CT ANGIOGRAPHY HEAD AND NECK TECHNIQUE: Multidetector CT imaging of the head and neck was performed using the standard protocol during bolus administration of intravenous contrast. Multiplanar CT image reconstructions and MIPs were obtained to evaluate the vascular anatomy. Carotid stenosis measurements (when applicable) are obtained utilizing NASCET criteria, using the distal internal carotid diameter as the denominator. CONTRAST:  72mL OMNIPAQUE IOHEXOL 350 MG/ML SOLN COMPARISON:  Plain head CT 1850 hours today. FINDINGS: CTA NECK Skeleton: Degenerative changes in the cervical spine including mild multilevel spondylolisthesis. No acute osseous abnormality identified. Upper chest: Mild mosaic attenuation in the upper lungs probably reflecting a combination of mild atelectasis and gas trapping. Other neck: Negative, no neck mass or lymphadenopathy identified. Aortic arch: 3 vessel arch configuration. Mild to moderate Calcified aortic atherosclerosis. Visible central pulmonary arteries also appear patent. Right carotid system: Negative aside from mild tortuosity and minimal plaque. Left carotid system: Negative aside from tortuosity and minimal plaque. Vertebral arteries: No proximal right subclavian or right vertebral artery origin stenosis. Dominant and mildly dolichoectatic right vertebral artery is widely patent to the skull base. There is moderate proximal left subclavian artery plaque including mural soft plaque on series 7, image 290. No hemodynamically significant stenosis. The left vertebral artery origin is unaffected. The left V1 segment is tortuous. The left vertebral is non dominant but still has a normal or even mildly ectatic caliber. No stenosis to the skull base. CTA HEAD Posterior circulation: Dominant and mildly  dolichoectatic right V4 segment. Normal right PICA origin. The left AICA appears dominant. There is no distal vertebral plaque or stenosis. Patent basilar artery without plaque or stenosis. Patent AICA, SCA and PCA origins. Posterior communicating arteries are diminutive or absent. The left PCA is within normal limits. There is mild to moderate irregularity and mild stenosis in the right P1 and P2 (series 10, image 20) with preserved distal enhancement. Anterior circulation: Both ICA siphons are patent. Mild calcified plaque on the left without stenosis. Minimal calcified plaque on the right without stenosis. Patent carotid termini. Normal MCA and ACA origins. Anterior communicating artery and bilateral  ACA branches are within normal limits. Right MCA M1 and bifurcation are patent without stenosis. Right MCA branches are within normal limits. Left MCA M1 is mildly tortuous but patent without stenosis. Left MCA bifurcation is patent without stenosis. Left MCA branches are within normal limits. Venous sinuses: Patent. Anatomic variants: Dominant right vertebral artery with mild-to-moderate dolichoectasia. Review of the MIP images confirms the above findings IMPRESSION: 1. Negative for large vessel occlusion. 2. Mild to moderate irregularity and mild stenosis of the right PCA P1 and P2 segments. But otherwise minimal for age atherosclerosis in the head and neck, and no other stenosis. There is mild-to-moderate arterial tortuosity. 3. There is more typical for age arch Aortic atherosclerosis (ICD10-I70.0, and also soft plaque in the proximal left subclavian artery. 4. Preliminary findings were discussed by telephone with Dr. Amie Portland on 11/14/2018 at 1906 hours. Electronically Signed   By: Genevie Ann M.D.   On: 11/14/2018 19:19   Ct Angio Neck W Or Wo Contrast  Result Date: 11/14/2018 CLINICAL DATA:  78 year old female code stroke. Confusion, repetitive speech. EXAM: CT ANGIOGRAPHY HEAD AND NECK TECHNIQUE:  Multidetector CT imaging of the head and neck was performed using the standard protocol during bolus administration of intravenous contrast. Multiplanar CT image reconstructions and MIPs were obtained to evaluate the vascular anatomy. Carotid stenosis measurements (when applicable) are obtained utilizing NASCET criteria, using the distal internal carotid diameter as the denominator. CONTRAST:  62mL OMNIPAQUE IOHEXOL 350 MG/ML SOLN COMPARISON:  Plain head CT 1850 hours today. FINDINGS: CTA NECK Skeleton: Degenerative changes in the cervical spine including mild multilevel spondylolisthesis. No acute osseous abnormality identified. Upper chest: Mild mosaic attenuation in the upper lungs probably reflecting a combination of mild atelectasis and gas trapping. Other neck: Negative, no neck mass or lymphadenopathy identified. Aortic arch: 3 vessel arch configuration. Mild to moderate Calcified aortic atherosclerosis. Visible central pulmonary arteries also appear patent. Right carotid system: Negative aside from mild tortuosity and minimal plaque. Left carotid system: Negative aside from tortuosity and minimal plaque. Vertebral arteries: No proximal right subclavian or right vertebral artery origin stenosis. Dominant and mildly dolichoectatic right vertebral artery is widely patent to the skull base. There is moderate proximal left subclavian artery plaque including mural soft plaque on series 7, image 290. No hemodynamically significant stenosis. The left vertebral artery origin is unaffected. The left V1 segment is tortuous. The left vertebral is non dominant but still has a normal or even mildly ectatic caliber. No stenosis to the skull base. CTA HEAD Posterior circulation: Dominant and mildly dolichoectatic right V4 segment. Normal right PICA origin. The left AICA appears dominant. There is no distal vertebral plaque or stenosis. Patent basilar artery without plaque or stenosis. Patent AICA, SCA and PCA origins.  Posterior communicating arteries are diminutive or absent. The left PCA is within normal limits. There is mild to moderate irregularity and mild stenosis in the right P1 and P2 (series 10, image 20) with preserved distal enhancement. Anterior circulation: Both ICA siphons are patent. Mild calcified plaque on the left without stenosis. Minimal calcified plaque on the right without stenosis. Patent carotid termini. Normal MCA and ACA origins. Anterior communicating artery and bilateral ACA branches are within normal limits. Right MCA M1 and bifurcation are patent without stenosis. Right MCA branches are within normal limits. Left MCA M1 is mildly tortuous but patent without stenosis. Left MCA bifurcation is patent without stenosis. Left MCA branches are within normal limits. Venous sinuses: Patent. Anatomic variants: Dominant right vertebral artery with  mild-to-moderate dolichoectasia. Review of the MIP images confirms the above findings IMPRESSION: 1. Negative for large vessel occlusion. 2. Mild to moderate irregularity and mild stenosis of the right PCA P1 and P2 segments. But otherwise minimal for age atherosclerosis in the head and neck, and no other stenosis. There is mild-to-moderate arterial tortuosity. 3. There is more typical for age arch Aortic atherosclerosis (ICD10-I70.0, and also soft plaque in the proximal left subclavian artery. 4. Preliminary findings were discussed by telephone with Dr. Amie Portland on 11/14/2018 at 1906 hours. Electronically Signed   By: Genevie Ann M.D.   On: 11/14/2018 19:19   Ct Head Code Stroke Wo Contrast  Result Date: 11/14/2018 CLINICAL DATA:  Code stroke. 78 year old female with headache and confusion. Last seen normal 1650 hours. EXAM: CT HEAD WITHOUT CONTRAST TECHNIQUE: Contiguous axial images were obtained from the base of the skull through the vertex without intravenous contrast. COMPARISON:  None. FINDINGS: Brain: Cerebral volume is within normal limits for age. No  midline shift, mass effect, or evidence of intracranial mass lesion. No ventriculomegaly. No acute intracranial hemorrhage identified. Normal for age gray-white matter differentiation throughout the brain. No cortically based acute infarct identified. Vascular: Calcified atherosclerosis at the skull base. Questionable asymmetric hyperdensity of the left MCA M1, such as on sagittal image 34. Skull: No acute osseous abnormality identified. Sinuses/Orbits: Visualized paranasal sinuses and mastoids are clear. Other: No acute orbit or scalp soft tissue finding. ASPECTS [facility] Stroke Program Early CT Score) Total score (0-10 with 10 being normal): 10 IMPRESSION: 1. Questionable asymmetric density of the left MCA M1 segment. 2. But otherwise negative for age Normal noncontrast CT appearance of the brain. ASPECTS 10. 3. These results were discussed with Dr. Rory Percy at 6:56 pm on 11/14/2018. Electronically Signed   By: Genevie Ann M.D.   On: 11/14/2018 18:58    EKG: Independently reviewed.  Sinus rhythm, no acute ischemic changes.  Assessment/Plan Principal Problem:   Transient global amnesia Active Problems:   Hyperlipidemia   Hypertension   Postural hypotension  Tiffany Vaughn is a 78 y.o. female with medical history significant for hypertension and hyperlipidemia who is admitted with suspected transient global amnesia versus CVA.   Transient global amnesia: History is consistent with TGA, MRI brain without contrast is pending to rule out CVA.  She is beginning to regain some recent memory, last thing she remembers is making breakfast morning of 11/14/2018. -Follow-up MRI brain, if negative obtain EEG in a.m. which is ordered -Deferring further stroke work-up pending MRI brain -Continue neurochecks  Hypertension: Previously on Toprol which was recently cut down and eventually discontinued due to recent postural hypotension.  She is prescribed Florinef as needed for low BP.  Has been hypertensive on  admission. -Continue to monitor, consider addition of low dose antihypertensive if needed and able to tolerate with postural change  Hyperlipidemia: -Continue atorvastatin and Zetia   DVT prophylaxis: Subcutaneous heparin Code Status: Full code, confirmed with patient and husband Family Communication: Discussed with husband Tiffany Vaughn by phone Disposition Plan: Pending clinical progress Consults called: Neurology Admission status: Observation   Zada Finders MD [facility]  If 7PM-7AM, please contact night-coverage [email or web address]  11/15/2018, 12:24 AM

## 2018-11-14 NOTE — Consult Note (Signed)
Neurology Consultation  Reason for Consult: Code stroke Referring Physician: Dr. Lita Mains  CC: Confusion  History is obtained from: EMS, chart, patient  HPI: Tiffany Vaughn is a 78 y.o. female remote history of breast cancer, hypertension, hyperlipidemia, anxiety and depression and vitamin D deficiency, usual state of health and last known normal till about 4:50 PM today, was found to be confused by family. She has had some shocking news regarding the family members accident which turned out to be false that was shocking for her.  She drove from her house in [place] and was found in a mall in [place] confused and unable to recollect how she got there.  EMS was called after the family brought her home.  She had a very repetitive line of questioning and kept repeating the same facts and stories about her deceased daughter to EMS.  Family reported that she is a very anxious personality and worries a lot about everything and upon receiving a sudden bad news which ultimately turned out to be falls, she did get very startled. She did report a very mild frontal headache at one point.  No other focal deficits were observed by EMS but due to the sudden onset of confusion, code stroke was called and she was brought into the emergency room at [facility]. In the ER, she was examined over at the bridge, noncontrast head CT and CTA were done.  Please see details below.   LKW: 1650 hrs. on 11/14/2018 tpa given?: no, symptoms consistent with possible TGA versus hypertensive encephalopathy and not stroke Premorbid modified Rankin scale (mRS): 0  ROS: ROS was performed and is negative except as noted in the HPI   Past Medical History:  Diagnosis Date  . Allergy   . Cancer of right breast ([place]) 1992  . Depression   . Hyperlipidemia   . Hypertension   . Vitamin D deficiency      Family History  Problem Relation Age of Onset  . Heart attack Mother 40  . Heart attack Father 69  .  Leukemia Brother    Social History:   reports that she has never smoked. She has never used smokeless tobacco. She reports that she does not drink alcohol or use drugs.  Medications  Current Facility-Administered Medications:  .  sodium chloride flush (NS) 0.9 % injection 3 mL, 3 mL, Intravenous, Once, Julianne Rice, MD  Current Outpatient Medications:  .  Ascorbic Acid (VITAMIN C PO), Take by mouth daily., Disp: , Rfl:  .  ASPIRIN PO, Take 81 mg by mouth daily., Disp: , Rfl:  .  atorvastatin (LIPITOR) 20 MG tablet, Take 1 tablet (20 mg total) by mouth at bedtime., Disp: 90 tablet, Rfl: 3 .  azelastine (OPTIVAR) 0.05 % ophthalmic solution, Place 1 drop into both eyes 2 (two) times daily as needed., Disp: 6 mL, Rfl: 6 .  Cholecalciferol (VITAMIN D PO), Take 5,000 Units by mouth daily. , Disp: , Rfl:  .  ezetimibe (ZETIA) 10 MG tablet, Take 1 tablet (10 mg total) by mouth daily., Disp: 90 tablet, Rfl: 1 .  fludrocortisone (FLORINEF) 0.1 MG tablet, Take 1 tablet 2 x /day for Low  BP (Patient taking differently: Take 1 tablet 2 x /day for Low  BP as needed), Disp: 60 tablet, Rfl: 2 .  Omega-3 Fatty Acids (FISH OIL PO), Take by mouth daily., Disp: , Rfl:   Exam: Current vital signs: There were no vitals taken for this visit. Vital signs in last  24 hours:    Blood pressure 183/109 GENERAL: Awake, alert in NAD HEENT: - Normocephalic and atraumatic, dry mm, no LN++, no Thyromegally LUNGS - Clear to auscultation bilaterally with no wheezes CV - S1S2 RRR, no m/r/g, equal pulses bilaterally. ABDOMEN - Soft, nontender, nondistended with normoactive BS Ext: warm, well perfused, intact peripheral pulses, no edema  NEURO:  Mental Status: She is awake, alert.  She is oriented to self.  She is oriented to the fact that she is in the hospital.  She is not oriented to time.  Her attention concentration is mildly reduced. She was able to tell me who the president is.  She was not able to tell me  the month or the year.  She is not able to tell me that there is an ongoing viral pandemic and upon telling her about that, she seemed oblivious to that fact.  Upon asking her why she is wearing a mask, she said that the EMTs put on her.  And then she joked saying maybe I have bad breath. Language: speech is clear.  Naming, repetition, fluency, and comprehension intact.  She is able to follow single and multistep commands consistently without problem Cranial Nerves: PERRL. EOMI, visual fields full, no facial asymmetry, facial sensation intact, hearing intact, tongue/uvula/soft palate midline, normal sternocleidomastoid and trapezius muscle strength. No evidence of tongue atrophy or fibrillations Motor: 5/5 with no vertical drift in all fours Tone: is normal and bulk is normal Sensation- Intact to light touch bilaterally Coordination: FTN intact bilaterally, no ataxia in BLE. Gait- deferred  NIHSS 1a Level of Conscious.: 0 1b LOC Questions: 1 1c LOC Commands: 0 2 Best Gaze: 0 3 Visual: 0 4 Facial Palsy: 0 5a Motor Arm - left: 0 5b Motor Arm - Right: 0 6a Motor Leg - Left: 0 6b Motor Leg - Right: 0 7 Limb Ataxia: 0 8 Sensory: 0 9 Best Language: 0 10 Dysarthria: 0 11 Extinct. and Inatten.: 0 TOTAL: 1    Labs I have reviewed labs in epic and the results pertinent to this consultation are:  CBC    Component Value Date/Time   WBC 7.8 10/05/2018 1415   RBC 5.02 10/05/2018 1415   HGB 15.6 (H) 11/14/2018 1851   HCT 46.0 11/14/2018 1851   PLT 351 10/05/2018 1415   MCV 86.5 10/05/2018 1415   MCH 29.9 10/05/2018 1415   MCHC 34.6 10/05/2018 1415   RDW 13.8 10/05/2018 1415   LYMPHSABS 2,223 10/05/2018 1415   MONOABS 600 08/04/2016 1206   EOSABS 172 10/05/2018 1415   BASOSABS 23 10/05/2018 1415    CMP     Component Value Date/Time   NA 139 11/14/2018 1851   K 4.3 11/14/2018 1851   CL 105 11/14/2018 1851   CO2 26 10/05/2018 1415   GLUCOSE 112 (H) 11/14/2018 1851   BUN 24 (H)  11/14/2018 1851   CREATININE 1.00 11/14/2018 1851   CREATININE 0.92 10/05/2018 1415   CALCIUM 9.8 10/05/2018 1415   PROT 6.9 10/05/2018 1415   ALBUMIN 4.3 08/04/2016 1206   AST 18 10/05/2018 1415   ALT 17 10/05/2018 1415   ALKPHOS 57 08/04/2016 1206   BILITOT 0.5 10/05/2018 1415   GFRNONAA 59 (L) 10/05/2018 1415   GFRAA 69 10/05/2018 1415    Lipid Panel     Component Value Date/Time   CHOL 138 10/05/2018 1415   TRIG 88 10/05/2018 1415   HDL 47 (L) 10/05/2018 1415   CHOLHDL 2.9 10/05/2018 1415  VLDL 24 08/04/2016 1206   LDLCALC 74 10/05/2018 1415   Imaging I have reviewed the images obtained: CT-scan of the brain-questionable left M1 hyperdensity, otherwise no other abnormalities. I ordered CT head and neck and it was acquired.  No emergent LVO.  Formal read pending.  Assessment:  78 year old with remote history of breast cancer, depression, anxiety hypertension hyperlipidemia and vitamin D deficiency being brought in for evaluation of confusion. The description of her presenting symptoms is that she had a sudden onset of confusion which has been described as repetitive questioning that started after she received some bad news regarding a family member and has since been awake alert but not seeming like her normal self and asking the same questions over and over again. She also had her antihypertensive doses adjusted recently and was hypertensive to the systolics in the 998P. I suspect that at this time this is either a transient global amnesia or hypertensive urgency. I do not have any localizing signs that would make me suspect a stroke.   CT of the head had a possible questionable hyperdense left M1 but on the CT angios head and neck, the vessels are wide open.  Impression: -Strokelike symptoms-confusion -Transient global amnesia versus hypertensive urgency  Recommendations: -Stat MRI as soon as possible -If symptoms persist, should be admitted and observed  overnight. -She should also get an EEG in the morning. -If the MRI shows a stroke, then only pursue stroke work-up. -If the EEG shows any echographic abnormalities, can consider starting antiepileptics but usually TGA does not require any specific treatment. -Also TGA's are monophasic and usually do not recur  -- Amie Portland, MD [facility] Pager: [phone] If 7pm to 7am, please call on call as listed on AMION.

## 2018-11-15 ENCOUNTER — Observation Stay (HOSPITAL_COMMUNITY): Payer: Medicare Other

## 2018-11-15 DIAGNOSIS — G454 Transient global amnesia: Secondary | ICD-10-CM | POA: Diagnosis not present

## 2018-11-15 DIAGNOSIS — I10 Essential (primary) hypertension: Secondary | ICD-10-CM | POA: Diagnosis not present

## 2018-11-15 LAB — CBC
HCT: 41.8 % (ref 36.0–46.0)
Hemoglobin: 13.6 g/dL (ref 12.0–15.0)
MCH: 28.9 pg (ref 26.0–34.0)
MCHC: 32.5 g/dL (ref 30.0–36.0)
MCV: 88.9 fL (ref 80.0–100.0)
Platelets: 344 10*3/uL (ref 150–400)
RBC: 4.7 MIL/uL (ref 3.87–5.11)
RDW: 13.6 % (ref 11.5–15.5)
WBC: 11.2 10*3/uL — ABNORMAL HIGH (ref 4.0–10.5)
nRBC: 0 % (ref 0.0–0.2)

## 2018-11-15 LAB — BASIC METABOLIC PANEL
Anion gap: 11 (ref 5–15)
BUN: 20 mg/dL (ref 8–23)
CO2: 25 mmol/L (ref 22–32)
Calcium: 9.4 mg/dL (ref 8.9–10.3)
Chloride: 104 mmol/L (ref 98–111)
Creatinine, Ser: 0.98 mg/dL (ref 0.44–1.00)
GFR calc Af Amer: >60 mL/min (ref >60)
GFR calc non Af Amer: 55 mL/min — ABNORMAL LOW (ref >60)
Glucose, Bld: 119 mg/dL — ABNORMAL HIGH (ref 70–99)
Potassium: 4.3 mmol/L (ref 3.5–5.1)
Sodium: 140 mmol/L (ref 135–145)

## 2018-11-15 MED ORDER — ACETAMINOPHEN 650 MG RE SUPP: 650.0000 mg | Freq: Four times a day (QID) | RECTAL | Status: DC | PRN

## 2018-11-15 MED ORDER — ONDANSETRON HCL 4 MG/2ML IJ SOLN
4.0000 mg | Freq: Four times a day (QID) | INTRAMUSCULAR | Status: DC | PRN
  Administered 2018-11-15: 4 mg via INTRAVENOUS
  Filled 2018-11-15: qty 2

## 2018-11-15 MED ORDER — ACETAMINOPHEN 325 MG PO TABS
650.0000 mg | ORAL_TABLET | Freq: Four times a day (QID) | ORAL | Status: DC | PRN
  Administered 2018-11-15: 01:00:00 650 mg via ORAL
  Filled 2018-11-15: qty 2

## 2018-11-15 MED ORDER — EZETIMIBE 10 MG PO TABS
10.0000 mg | ORAL_TABLET | Freq: Every day | ORAL | Status: DC
  Administered 2018-11-15: 10 mg via ORAL
  Filled 2018-11-15: qty 1

## 2018-11-15 MED ORDER — ATORVASTATIN CALCIUM 10 MG PO TABS: 20.0000 mg | ORAL_TABLET | Freq: Every day | ORAL | Status: DC

## 2018-11-15 MED ORDER — ASPIRIN 81 MG PO CHEW
81.0000 mg | CHEWABLE_TABLET | Freq: Every day | ORAL | Status: DC
  Administered 2018-11-15: 81 mg via ORAL
  Filled 2018-11-15: qty 1

## 2018-11-15 MED ORDER — ONDANSETRON HCL 4 MG PO TABS: 4.0000 mg | ORAL_TABLET | Freq: Four times a day (QID) | ORAL | Status: DC | PRN

## 2018-11-15 MED ORDER — SODIUM CHLORIDE 0.9% FLUSH
3.0000 mL | Freq: Two times a day (BID) | INTRAVENOUS | Status: DC
  Administered 2018-11-15: 01:00:00 3 mL via INTRAVENOUS

## 2018-11-15 MED ORDER — VITAMIN D 25 MCG (1000 UNIT) PO TABS
5000.0000 [IU] | ORAL_TABLET | Freq: Every day | ORAL | Status: DC
  Administered 2018-11-15: 10:00:00 5000 [IU] via ORAL
  Filled 2018-11-15: qty 5

## 2018-11-15 MED ORDER — HEPARIN SODIUM (PORCINE) 5000 UNIT/ML IJ SOLN
5000.0000 [IU] | Freq: Three times a day (TID) | INTRAMUSCULAR | Status: DC
  Administered 2018-11-15: 06:00:00 5000 [IU] via SUBCUTANEOUS
  Filled 2018-11-15: qty 1

## 2018-11-15 NOTE — Progress Notes (Signed)
Patient states to this nurse that she had been on Metoprolol "some time ago" for regulation of her blood pressure, but was "taken off of it" by her PCP.  No further antihypertensives had been ordered at that time, she states.

## 2018-11-15 NOTE — Progress Notes (Signed)
Interim progress/sign off note  EEG completed and is normal with no evidence of underlying electrographic abnormalities.   Impression: TGA provoked by psychological stressors versus hypertensive urgency. There might also be a component of anxiety.  Recommendations: Continue home antiplatelets and statin No need for antiepileptics Management of blood pressure with a goal of normotension as an outpatient-defer to outpatient primary care. May benefit from outpatient counseling/psychiatry consult if deemed appropriate for anxiety and depression.  Relayed plan over the phone to Dr. Pietro Cassis   -- Amie Portland, MD [facility] Pager: [phone] If 7pm to 7am, please call on call as listed on AMION.

## 2018-11-15 NOTE — Progress Notes (Addendum)
Neurology Progress Note   S:// Patient states she is back to baseline; she does not remember much of yesterday other than receiving a call about her grandson being in a car wreck and in deep trouble and someone demanding a large sum of money to keep him out of trouble.  O:// Current vital signs: BP 121/68 (BP Location: Left Arm)   Pulse 61   Temp 98.4 F (36.9 C) (Oral)   Resp 18   SpO2 94%  Vital signs in last 24 hours: Temp:  [97.7 F (36.5 C)-98.4 F (36.9 C)] 98.4 F (36.9 C) (05/20 0830) Pulse Rate:  [61-91] 61 (05/20 0830) Resp:  [14-26] 18 (05/20 0830) BP: (92-188)/(65-151) 121/68 (05/20 0830) SpO2:  [87 %-99 %] 94 % (05/20 0830) GENERAL: Awake, alert in NAD HEENT: - Normocephalic and atraumatic, moist mucous membranes NEURO:  Mental Status: AA&Ox3  Language: speech is clear, fluent without aphasia or dysarthria Cranial Nerves:  EOMI, visual fields full, no facial asymmetry, facial sensation intact, hearing intact Motor: 5/5 throughout Tone: is normal and bulk is normal Sensation- Intact to light touch bilaterally Coordination: FTN intact bilaterally. Gait- deferred  Medications  Current Facility-Administered Medications:  .  acetaminophen (TYLENOL) tablet 650 mg, 650 mg, Oral, Q6H PRN, 650 mg at 11/15/18 0111 **OR** acetaminophen (TYLENOL) suppository 650 mg, 650 mg, Rectal, Q6H PRN, Zada Finders R, MD .  aspirin chewable tablet 81 mg, 81 mg, Oral, Daily, Patel, Vishal R, MD .  atorvastatin (LIPITOR) tablet 20 mg, 20 mg, Oral, QHS, Patel, Vishal R, MD .  cholecalciferol (VITAMIN D3) tablet 5,000 Units, 5,000 Units, Oral, Daily, Patel, Vishal R, MD .  ezetimibe (ZETIA) tablet 10 mg, 10 mg, Oral, Daily, Patel, Vishal R, MD .  heparin injection 5,000 Units, 5,000 Units, Subcutaneous, Q8H, Lenore Cordia, MD, 5,000 Units at 11/15/18 [phone] .  ondansetron (ZOFRAN) tablet 4 mg, 4 mg, Oral, Q6H PRN **OR** ondansetron (ZOFRAN) injection 4 mg, 4 mg, Intravenous, Q6H PRN,  Lenore Cordia, MD, 4 mg at 11/15/18 0116 .  sodium chloride flush (NS) 0.9 % injection 3 mL, 3 mL, Intravenous, Once, Julianne Rice, MD .  sodium chloride flush (NS) 0.9 % injection 3 mL, 3 mL, Intravenous, Q12H, Zada Finders R, MD, 3 mL at 11/15/18 0116 Labs CBC    Component Value Date/Time   WBC 11.2 (H) 11/15/2018 0444   RBC 4.70 11/15/2018 0444   HGB 13.6 11/15/2018 0444   HCT 41.8 11/15/2018 0444   PLT 344 11/15/2018 0444   MCV 88.9 11/15/2018 0444   MCH 28.9 11/15/2018 0444   MCHC 32.5 11/15/2018 0444   RDW 13.6 11/15/2018 0444   LYMPHSABS 2.0 11/14/2018 1846   MONOABS 0.7 11/14/2018 1846   EOSABS 0.0 11/14/2018 1846   BASOSABS 0.0 11/14/2018 1846  CMP     Component Value Date/Time   NA 140 11/15/2018 0444   K 4.3 11/15/2018 0444   CL 104 11/15/2018 0444   CO2 25 11/15/2018 0444   GLUCOSE 119 (H) 11/15/2018 0444   BUN 20 11/15/2018 0444   CREATININE 0.98 11/15/2018 0444   CREATININE 0.92 10/05/2018 1415   CALCIUM 9.4 11/15/2018 0444   PROT 7.3 11/14/2018 1846   ALBUMIN 4.5 11/14/2018 1846   AST 28 11/14/2018 1846   ALT 26 11/14/2018 1846   ALKPHOS 105 11/14/2018 1846   BILITOT 0.9 11/14/2018 1846   GFRNONAA 55 (L) 11/15/2018 0444   GFRNONAA 59 (L) 10/05/2018 1415   GFRAA >60 11/15/2018 0444   GFRAA  18 10/05/2018 1415   Imaging I have reviewed images in epic and the results pertinent to this consultation are: CT-scan of the brain: asymmetric density of left MCA M1 segment; no hemorrhage CTA head/neck w/wo contrast: no LVO, mild R PCA stenosis MRI examination of the brain: No evidence of infarct; unremarkable for age  Assessment:  78yo female with PMH of breast cancer, MDD, anxiety, HTN, HLD, and vit D deficiency who presented to [facility] as code stroke for persistent confusion with repetitive questioning after receiving bad news about family member.  Imaging unremarkable. Patient back to baseline today with memory intact and no focal neuro deficits.  This  likely represents transient global amnesia vs hypertensive urgency as she was hypertensive at time of admission with recent de-escalation of her antihypertensives.  Her workup will be complete after EEG.  Recommendations: -f/u EEG -control BP -if EEG unremarkable will likely be able to d/c home, otherwise can consider starting antiepileptics- will follow EEG.  Alphonzo Grieve, MD PGY3  Attending Neurohospitalist Addendum Patient seen and examined with APP/Resident. Agree with the history and physical as documented above. Agree with the plan as documented, which I helped formulate. I have independently reviewed the chart, obtained history, review of systems and examined the patient I have personally reviewed pertinent head/neck/spine imaging (CT/MRI). MRI brain is unremarkable. Will follow EEG. Likely TIA v HTN Urgency. Please feel free to call with any questions. --- Amie Portland, MD [facility] Pager: [phone]  If 7pm to 7am, please call on call as listed on AMION.

## 2018-11-15 NOTE — TOC Transition Note (Signed)
Transition of Care [facility]) - CM/SW Discharge Note   Patient Details  Name: Tiffany Vaughn MRN: [id number] Date of Birth: 1940-08-20  [facility] CM/SW Contact:  Pollie Friar, RN Phone Number: 11/15/2018, 1:39 PM   Clinical Narrative:    Pt discharging home with self care.  Pt has transportation home.   Final next level of care: Home/Self Care Barriers to Discharge: Barriers Resolved   Patient Goals and CMS Choice        Discharge Placement                       Discharge Plan and Services                                     Social Determinants of Health (SDOH) Interventions     Readmission Risk Interventions No flowsheet data found.

## 2018-11-15 NOTE — Procedures (Signed)
ELECTROENCEPHALOGRAM REPORT   Patient: Tiffany Vaughn       Room #: 2R42H EEG No. ID: [id number] Age: 78 y.o.        Sex: female Referring Physician: Dahal Report Date:  11/15/2018        Interpreting Physician: Alexis Goodell  History: Tiffany Vaughn is an 78 y.o. female with new onset memory loss  Medications:  ASA, Lipitor, Zetia  Conditions of Recording:  This is a 21 channel routine scalp EEG performed with bipolar and monopolar montages arranged in accordance to the international 10/20 system of electrode placement. One channel was dedicated to EKG recording.  The patient is in the awake state.  Description:  The waking background activity consists of a low voltage, symmetrical, fairly well organized, 10 Hz alpha activity, seen from the parieto-occipital and posterior temporal regions.  Low voltage fast activity, poorly organized, is seen anteriorly and is at times superimposed on more posterior regions.  A mixture of theta and alpha rhythms are seen from the central and temporal regions. The patient does not drowse or sleep. No epileptiform activity is noted.   Hyperventilation was not performed. Intermittent photic stimulation was performed and elicits a symmetrical driving response but fails to elicit any abnormalities.)  IMPRESSION: This is a normal awake electroencephalogram with activation procedures. There are no focal lateralizing or epileptiform features.   Alexis Goodell, MD Neurology [phone] 11/15/2018, 11:45 AM

## 2018-11-15 NOTE — TOC Initial Note (Signed)
Transition of Care [facility]) - Initial/Assessment Note    Patient Details  Name: Tiffany Vaughn MRN: [id number] Date of Birth: 1941/03/15  [facility] of [facility]) CM/SW Contact:    Pollie Friar, RN Phone Number: 11/15/2018, 1:39 PM  Clinical Narrative:                 Pt denies issue with home medications or transportation.  Expected Discharge Plan: Home/Self Care Barriers to Discharge: Barriers Resolved   Patient Goals and CMS Choice        Expected Discharge Plan and Services Expected Discharge Plan: Home/Self Care       Living arrangements for the past 2 months: Single Family Home(3 levels with the basement) Expected Discharge Date: 11/15/18                                    Prior Living Arrangements/Services Living arrangements for the past 2 months: Single Family Home(3 levels with the basement) Lives with:: Spouse Patient language and need for interpreter reviewed:: Yes(no needs) Do you feel safe going back to the place where you live?: Yes      Need for Family Participation in Patient Care: No (Comment) Care giver support system in place?: Yes (comment)(spouse)   Criminal Activity/Legal Involvement Pertinent to Current Situation/Hospitalization: No - Comment as needed  Activities of Daily Living      Permission Sought/Granted                  Emotional Assessment Appearance:: Appears stated age Attitude/Demeanor/Rapport: Engaged Affect (typically observed): Accepting Orientation: : Oriented to Self, Oriented to Place, Oriented to  Time, Oriented to Situation   Psych Involvement: No (comment)  Admission diagnosis:  Amnesia memory loss [R41.3] Patient Active Problem List   Diagnosis Date Noted  . Transient global amnesia 11/14/2018  . Postural hypotension 11/14/2018  . Other abnormal glucose 01/15/2016  . Osteopenia 12/23/2014  . Medication management 10/24/2013  . Hyperlipidemia   . Hypertension   . Vitamin D deficiency   .  Depression, major, in remission ([place])   . Seasonal allergies   . History of right breast cancer    PCP:  Unk Pinto, MD Pharmacy:   [id number] - [place], [place] [place] [address] Phone: [phone] Fax: [phone]  [place], Mobile [facility] [address] [place] [address] [place] [address] Phone: [phone] Fax: [phone]     Social Determinants of Health (SDOH) Interventions    Readmission Risk Interventions No flowsheet data found.

## 2018-11-15 NOTE — Discharge Instructions (Signed)
SPECT Brain Scan A SPECT scan (single-photon emission computed tomography) is a test that is used to study the brain. For the test, a small amount of radioactive material is injected into a vein. A special camera (nuclear medicine camera) then takes pictures of your brain. These pictures help your health care provider to see how blood flows to parts of the brain. The test is done to:  Evaluate memory loss.  Evaluate a brain injury.  Diagnose certain conditions, including: ? Alzheimer's disease. ? Strokes. ? Dementia. ? Seizures. Tell a health care provider about:  Any allergies you have.  All medicines you are taking, including vitamins, herbs, eye drops, creams, and over-the-counter medicines.  Any blood disorders you have.  Any surgeries you have had.  Any medical conditions you have.  If you are afraid of cramped spaces (claustrophobic). If claustrophobia is a problem, it usually can be relieved with a medicine to help you relax (sedative) or a medicine to treat anxiety.  If you have trouble staying still for long periods of time.  Whether you are pregnant or may be pregnant. What are the risks? Generally, this is a safe procedure. However, problems may occur, including:  Bleeding, pain, or swelling at the injection site.  An allergic reaction to the radioactive material. This is rare. What happens before the procedure?  Do not eat or drink anything after midnight on the night before the procedure or as directed by your health care provider.  Take medicines only as directed by your health care provider.  Do not wear jewelry.  Wear loose, comfortable clothing. You may be asked to wear a hospital gown for the procedure.  Tell your health care provider if you are pregnant or breastfeeding.  If you have diabetes, ask your health care provider for diet guidelines to control your blood sugar (glucose) levels on the day of the test. What happens during the  procedure?  An IV will be inserted into one of your veins.  A small amount of radioactive material will be injected into a vein.  You will wait several minutes after the injection. This allows the radioactive material to travel to your brain.  You will lie on your back on a cushioned table. The test will begin when the material has reached your brain.  A nuclear medicine camera will take pictures of your brain. You will need to stay still during this time.  The length of time needed for the scan will vary depending on the reason you need the scan.  The camera will record the amount of radioactive material in the brain. Different concentrations of radioactive material will show up as different colors on a computer. What can I expect after procedure?   Ask your health care provider, or the department that is doing the test: ? When will my results be ready? ? How will I get my results? ? What are my treatment options? ? What other tests do I need? ? What are my next steps?  You may resume your normal diet and activities.  Drink 6-8 glasses of water following the test to flush the radioactive material out of your body. Drink enough fluid to keep your urine pale yellow. Summary  A SPECT scan, or single-photon emission computed tomography, is a test used to study the brain.  It can help evaluate a brain injury and aid in the diagnosis of Alzheimer's disease, dementia, stroke, and seizure disorders.  You may resume your normal diet and activities following a  SPECT scan. This information is not intended to replace advice given to you by your health care provider. Make sure you discuss any questions you have with your health care provider. Document Released: 06/14/2005 Document Revised: 07/13/2017 Document Reviewed: 07/13/2017 Elsevier Interactive Patient Education  2019 [employer].

## 2018-11-15 NOTE — Plan of Care (Signed)
  Problem: Education: Goal: Knowledge of General Education information will improve Description Including pain rating scale, medication(s)/side effects and non-pharmacologic comfort measures Outcome: Adequate for Discharge   Problem: Health Behavior/Discharge Planning: Goal: Ability to manage health-related needs will improve Outcome: Adequate for Discharge   Problem: Clinical Measurements: Goal: Ability to maintain clinical measurements within normal limits will improve Outcome: Adequate for Discharge Goal: Will remain free from infection Outcome: Adequate for Discharge Goal: Diagnostic test results will improve Outcome: Adequate for Discharge Goal: Respiratory complications will improve Outcome: Adequate for Discharge Goal: Cardiovascular complication will be avoided Outcome: Adequate for Discharge   Problem: Activity: Goal: Risk for activity intolerance will decrease Outcome: Adequate for Discharge   Problem: Nutrition: Goal: Adequate nutrition will be maintained Outcome: Adequate for Discharge   Problem: Nutrition: Goal: Adequate nutrition will be maintained Outcome: Adequate for Discharge   Problem: Coping: Goal: Level of anxiety will decrease Outcome: Adequate for Discharge   Problem: Elimination: Goal: Will not experience complications related to bowel motility Outcome: Adequate for Discharge Goal: Will not experience complications related to urinary retention Outcome: Adequate for Discharge   Problem: Pain Managment: Goal: General experience of comfort will improve Outcome: Adequate for Discharge   Problem: Safety: Goal: Ability to remain free from injury will improve Outcome: Adequate for Discharge   Problem: Skin Integrity: Goal: Risk for impaired skin integrity will decrease Outcome: Adequate for Discharge

## 2018-11-15 NOTE — Progress Notes (Signed)
EEG Completed; Results Pending  
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
## 2018-12-06 DIAGNOSIS — H16223 Keratoconjunctivitis sicca, not specified as Sjogren's, bilateral: Secondary | ICD-10-CM | POA: Diagnosis not present

## 2018-12-06 DIAGNOSIS — H40003 Preglaucoma, unspecified, bilateral: Secondary | ICD-10-CM | POA: Diagnosis not present

## 2018-12-06 DIAGNOSIS — H10413 Chronic giant papillary conjunctivitis, bilateral: Secondary | ICD-10-CM | POA: Diagnosis not present

## 2018-12-06 DIAGNOSIS — H527 Unspecified disorder of refraction: Secondary | ICD-10-CM | POA: Diagnosis not present

## 2018-12-25 ENCOUNTER — Ambulatory Visit (INDEPENDENT_AMBULATORY_CARE_PROVIDER_SITE_OTHER): Payer: Medicare Other | Admitting: Adult Health

## 2018-12-25 ENCOUNTER — Other Ambulatory Visit: Payer: Self-pay

## 2018-12-25 ENCOUNTER — Encounter: Payer: Self-pay | Admitting: Adult Health

## 2018-12-25 VITALS — BP 114/64 | HR 67 | Temp 97.3°F | Ht 64.5 in | Wt 132.0 lb

## 2018-12-25 DIAGNOSIS — L237 Allergic contact dermatitis due to plants, except food: Secondary | ICD-10-CM

## 2018-12-25 DIAGNOSIS — R21 Rash and other nonspecific skin eruption: Secondary | ICD-10-CM

## 2018-12-25 MED ORDER — PREDNISONE 20 MG PO TABS: ORAL_TABLET | ORAL | 0 refills | Status: AC

## 2018-12-25 NOTE — Patient Instructions (Addendum)
Poison Ivy Dermatitis Poison ivy dermatitis is inflammation of the skin that is caused by chemicals in the leaves of the poison ivy plant. The skin reaction often involves redness, swelling, blisters, and extreme itching. What are the causes? This condition is caused by a chemical (urushiol) found in the sap of the poison ivy plant. This chemical is sticky and can be easily spread to people, animals, and objects. You can get poison ivy dermatitis by:  Having direct contact with a poison ivy plant.  Touching animals, other people, or objects that have come in contact with poison ivy and have the chemical on them. What increases the risk? This condition is more likely to develop in people who:  Are outdoors often in wooded or marshy areas.  Go outdoors without wearing protective clothing, such as closed shoes, long pants, and a long-sleeved shirt. What are the signs or symptoms? Symptoms of this condition include:  Redness of the skin.  Extreme itching.  A rash that often includes bumps and blisters. The rash usually appears 48 hours after exposure, if you have been exposed before. If this is the first time you have been exposed, the rash may not appear until a week after exposure.  Swelling. This may occur if the reaction is more severe. Symptoms usually last for 1-2 weeks. However, the first time you develop this condition, symptoms may last 3-4 weeks. How is this diagnosed? This condition may be diagnosed based on your symptoms and a physical exam. Your health care provider may also ask you about any recent outdoor activity. How is this treated? Treatment for this condition will vary depending on how severe it is. Treatment may include:  Hydrocortisone cream or calamine lotion to relieve itching.  Oatmeal baths to soothe the skin.  Medicines, such as over-the-counter antihistamine tablets.  Oral steroid medicine, for more severe reactions. Follow these instructions at home:  Medicines  Take or apply over-the-counter and prescription medicines only as told by your health care provider.  Use hydrocortisone cream or calamine lotion as needed to soothe the skin and relieve itching. General instructions  Do not scratch or rub your skin.  Apply a cold, wet cloth (cold compress) to the affected areas or take baths in cool water. This will help with itching. Avoid hot baths and showers.  Take oatmeal baths as needed. Use colloidal oatmeal. You can get this at your local pharmacy or grocery store. Follow the instructions on the packaging.  While you have the rash, wash clothes right after you wear them.  Keep all follow-up visits as told by your health care provider. This is important. How is this prevented?   Learn to identify the poison ivy plant and avoid contact with the plant. This plant can be recognized by the number of leaves. Generally, poison ivy has three leaves with flowering branches on a single stem. The leaves are typically glossy, and they have jagged edges that come to a point at the front.  If you have been exposed to poison ivy, thoroughly wash with soap and water right away. You have about 30 minutes to remove the plant resin before it will cause the rash. Be sure to wash under your fingernails, because any plant resin there will continue to spread the rash.  When hiking or camping, wear clothes that will help you to avoid exposure on the skin. This includes long pants, a long-sleeved shirt, tall socks, and hiking boots. You can also apply preventive lotion to your skin to   help limit exposure.  If you suspect that your clothes or outdoor gear came in contact with poison ivy, rinse them off outside with a garden hose before you bring them inside your house.  When doing yard work or gardening, wear gloves, long sleeves, long pants, and boots. Wash your garden tools and gloves if they come in contact with poison ivy.  If you suspect that your pet  has come into contact with poison ivy, wash him or her with pet shampoo and water. Make sure to wear gloves while washing your pet. Contact a health care provider if you have:  Open sores in the rash area.  More redness, swelling, or pain in the affected area.  Redness that spreads beyond the rash area.  Fluid, blood, or pus coming from the affected area.  A fever.  A rash over a large area of your body.  A rash on your eyes, mouth, or genitals.  A rash that does not improve after a few weeks. Get help right away if:  Your face swells or your eyes swell shut.  You have trouble breathing.  You have trouble swallowing. These symptoms may represent a serious problem that is an emergency. Do not wait to see if the symptoms will go away. Get medical help right away. Call your local emergency services (911 in the [place] Do not drive yourself to the hospital. Summary  Poison ivy dermatitis is inflammation of the skin that is caused by chemicals in the leaves of the poison ivy plant.  Symptoms of this condition include redness, itching, a rash, and swelling.  Do not scratch or rub your skin.  Take or apply over-the-counter and prescription medicines only as told by your health care provider. This information is not intended to replace advice given to you by your health care provider. Make sure you discuss any questions you have with your health care provider. Document Released: 06/11/2000 Document Revised: 10/06/2018 Document Reviewed: 06/09/2018 Elsevier Patient Education  [place].      Prednisone tablets What is this medicine? PREDNISONE (PRED ni sone) is a corticosteroid. It is commonly used to treat inflammation of the skin, joints, lungs, and other organs. Common conditions treated include asthma, allergies, and arthritis. It is also used for other conditions, such as blood disorders and diseases of the adrenal glands. This medicine may be used for other  purposes; ask your health care provider or pharmacist if you have questions. COMMON BRAND NAME(S): Deltasone, Predone, Sterapred, Sterapred DS What should I tell my health care provider before I take this medicine? They need to know if you have any of these conditions:  Cushing's syndrome  diabetes  glaucoma  heart disease  high blood pressure  infection (especially a virus infection such as chickenpox, cold sores, or herpes)  kidney disease  liver disease  mental illness  myasthenia gravis  osteoporosis  seizures  stomach or intestine problems  thyroid disease  an unusual or allergic reaction to lactose, prednisone, other medicines, foods, dyes, or preservatives  pregnant or trying to get pregnant  breast-feeding How should I use this medicine? Take this medicine by mouth with a glass of water. Follow the directions on the prescription label. Take this medicine with food. If you are taking this medicine once a day, take it in the morning. Do not take more medicine than you are told to take. Do not suddenly stop taking your medicine because you may develop a severe reaction. Your doctor will tell you  how much medicine to take. If your doctor wants you to stop the medicine, the dose may be slowly lowered over time to avoid any side effects. Talk to your pediatrician regarding the use of this medicine in children. Special care may be needed. Overdosage: If you think you have taken too much of this medicine contact a poison control center or emergency room at once. NOTE: This medicine is only for you. Do not share this medicine with others. What if I miss a dose? If you miss a dose, take it as soon as you can. If it is almost time for your next dose, talk to your doctor or health care professional. You may need to miss a dose or take an extra dose. Do not take double or extra doses without advice. What may interact with this medicine? Do not take this medicine with any of  the following medications:  metyrapone  mifepristone This medicine may also interact with the following medications:  aminoglutethimide  amphotericin B  aspirin and aspirin-like medicines  barbiturates  certain medicines for diabetes, like glipizide or glyburide  cholestyramine  cholinesterase inhibitors  cyclosporine  digoxin  diuretics  ephedrine  female hormones, like estrogens and birth control pills  isoniazid  ketoconazole  NSAIDS, medicines for pain and inflammation, like ibuprofen or naproxen  phenytoin  rifampin  toxoids  vaccines  warfarin This list may not describe all possible interactions. Give your health care provider a list of all the medicines, herbs, non-prescription drugs, or dietary supplements you use. Also tell them if you smoke, drink alcohol, or use illegal drugs. Some items may interact with your medicine. What should I watch for while using this medicine? Visit your doctor or health care professional for regular checks on your progress. If you are taking this medicine over a prolonged period, carry an identification card with your name and address, the type and dose of your medicine, and your doctor's name and address. This medicine may increase your risk of getting an infection. Tell your doctor or health care professional if you are around anyone with measles or chickenpox, or if you develop sores or blisters that do not heal properly. If you are going to have surgery, tell your doctor or health care professional that you have taken this medicine within the last twelve months. Ask your doctor or health care professional about your diet. You may need to lower the amount of salt you eat. This medicine may increase blood sugar. Ask your healthcare provider if changes in diet or medicines are needed if you have diabetes. What side effects may I notice from receiving this medicine? Side effects that you should report to your doctor or  health care professional as soon as possible:  allergic reactions like skin rash, itching or hives, swelling of the face, lips, or tongue  changes in emotions or moods  changes in vision  depressed mood  eye pain  fever or chills, cough, sore throat, pain or difficulty passing urine  signs and symptoms of high blood sugar such as being more thirsty or hungry or having to urinate more than normal. You may also feel very tired or have blurry vision.  swelling of ankles, feet Side effects that usually do not require medical attention (report to your doctor or health care professional if they continue or are bothersome):  confusion, excitement, restlessness  headache  nausea, vomiting  skin problems, acne, thin and shiny skin  trouble sleeping  weight gain This list may not  describe all possible side effects. Call your doctor for medical advice about side effects. You may report side effects to FDA at 1-800-FDA-1088. Where should I keep my medicine? Keep out of the reach of children. Store at room temperature between 15 and 30 degrees C (59 and 86 degrees F). Protect from light. Keep container tightly closed. Throw away any unused medicine after the expiration date. NOTE: This sheet is a summary. It may not cover all possible information. If you have questions about this medicine, talk to your doctor, pharmacist, or health care provider.  2020 Elsevier/Gold Standard (2018-03-14 10:54:22)

## 2018-12-25 NOTE — Progress Notes (Signed)
Assessment and Plan:  Tiffany Vaughn was seen today for poison ivy.  Diagnoses and all orders for this visit:  Poison ivy dermatitis/ Rash of face  Can use benadryl, calamine lotion, oatmeal baths; contact precautions discussed, prevention reveiwed. Follow up PRN -     predniSONE (DELTASONE) 20 MG tablet; 3 tablets daily with food for 3 days, 2 tabs daily for 3 days, 1 tab a day for 5 days.  Further disposition pending results of labs. Discussed med's effects and SE's.   Over 15 minutes of exam, counseling, chart review, and critical decision making was performed.   Future Appointments  Date Time Provider [place]  01/17/2019 10:30 AM Unk Pinto, MD [facility] None  07/27/2019 10:00 AM Unk Pinto, MD [facility] None  11/05/2019  2:00 PM Liane Comber, NP [facility] None    ------------------------------------------------------------------------------------------------------------------   HPI 78 y.o.female with hx of htn, hyperlipidemia, depression in remission, recent episode of transient global amnesia attributed to stressful event presents for evaluation of rash that appeared after working yard yesterday; presents with rash around bilateral eyes and pruritis rash to upper and lower extremities; she believes is poison ivy, has had on several occasions previously. Has taken benadryl for itching. Has tried topical anti itch creams which have been helpful.   Past Medical History:  Diagnosis Date  . Allergy   . Cancer of right breast ([place]) 1992  . Depression   . Hyperlipidemia   . Hypertension   . Vitamin D deficiency      Allergies  Allergen Reactions  . Codeine Nausea And Vomiting    "Feels like I have the flu"    Current Outpatient Medications on File Prior to Visit  Medication Sig  . Ascorbic Acid (VITAMIN C PO) Take 1 capsule by mouth daily.   . ASPIRIN PO Take 81 mg by mouth daily.  Marland Kitchen atorvastatin (LIPITOR) 20 MG tablet Take 1 tablet (20 mg total) by  mouth at bedtime.  Marland Kitchen azelastine (OPTIVAR) 0.05 % ophthalmic solution Place 1 drop into both eyes 2 (two) times daily as needed. (Patient taking differently: Place 1 drop into both eyes 2 (two) times daily as needed (allergies). )  . Cholecalciferol (VITAMIN D PO) Take 5,000 Units by mouth daily.   Marland Kitchen ezetimibe (ZETIA) 10 MG tablet Take 1 tablet (10 mg total) by mouth daily.  . fludrocortisone (FLORINEF) 0.1 MG tablet Take 1 tablet 2 x /day for Low  BP (Patient not taking: Reported on 11/15/2018)  . Omega-3 Fatty Acids (FISH OIL PO) Take 1 capsule by mouth daily.    No current facility-administered medications on file prior to visit.     ROS: all negative except above.   Physical Exam:  There were no vitals taken for this visit.  General Appearance: Well nourished, in no apparent distress. Eyes: PERRLA, EOMs, conjunctiva no swelling or erythema Sinuses: No Frontal/maxillary tenderness ENT/Mouth: Ext aud canals clear, TMs without erythema, bulging. No erythema, swelling, or exudate on post pharynx.  Tonsils not swollen or erythematous. Hearing normal.  Neck: Supple Respiratory: Respiratory effort normal, BS equal bilaterally without rales, rhonchi, wheezing or stridor.  Cardio: RRR with no MRGs. Brisk peripheral pulses without edema.  Abdomen: Soft, + BS.  Non tender. Lymphatics: Non tender without lymphadenopathy.  Musculoskeletal: Symmetrical strength, normal gait.  Skin: Warm, dry; she has some swelling and erythema around bilateral eyes without distinct lesions; she has mild scattered erythematous rash with a few blisters with serous discharge to bilateral upper extremities and lower legs; excoriation  marks present Neuro: Cranial nerves intact. Normal muscle tone, no cerebellar symptoms.  Psych: Awake and oriented X 3, normal affect, Insight and Judgment appropriate.     Izora Ribas, NP 11:08 AM [name]

## 2019-01-17 ENCOUNTER — Encounter: Payer: Self-pay | Admitting: Internal Medicine

## 2019-01-17 ENCOUNTER — Other Ambulatory Visit: Payer: Self-pay

## 2019-01-17 ENCOUNTER — Ambulatory Visit (INDEPENDENT_AMBULATORY_CARE_PROVIDER_SITE_OTHER): Payer: Medicare Other | Admitting: Internal Medicine

## 2019-01-17 VITALS — BP 140/68 | HR 60 | Temp 97.0°F | Resp 16 | Ht 64.5 in | Wt 131.8 lb

## 2019-01-17 DIAGNOSIS — R7303 Prediabetes: Secondary | ICD-10-CM

## 2019-01-17 DIAGNOSIS — E782 Mixed hyperlipidemia: Secondary | ICD-10-CM

## 2019-01-17 DIAGNOSIS — R7309 Other abnormal glucose: Secondary | ICD-10-CM

## 2019-01-17 DIAGNOSIS — I10 Essential (primary) hypertension: Secondary | ICD-10-CM

## 2019-01-17 DIAGNOSIS — E559 Vitamin D deficiency, unspecified: Secondary | ICD-10-CM | POA: Diagnosis not present

## 2019-01-17 DIAGNOSIS — Z79899 Other long term (current) drug therapy: Secondary | ICD-10-CM

## 2019-01-17 NOTE — Patient Instructions (Signed)

## 2019-01-17 NOTE — Progress Notes (Signed)
History of Present Illness:      This very nice 78 y.o. MWF presents for 3 month follow up with HTN, HLD, Pre-Diabetes and Vitamin D Deficiency. Patient was hospitalized in May for an episode of  TGA and Brain scans were negative.       Patient is treated for HTN (2004)  & BP has been controlled at home. She's had 2 Negative Cardiolite Stress tests in 2005 & 2011. In April, patient began running low BP's and Toprol was d/c'd and Florinef was Rx'd , but she never started it.  Today's BP is at goal - 140/68.  Patient has had no complaints of any cardiac type chest pain, palpitations, dyspnea / orthopnea / PND, dizziness, claudication, or dependent edema.      Hyperlipidemia is controlled with diet & meds. Patient denies myalgias or other med SE's. Last Lipids were  Lab Results  Component Value Date   CHOL 168 01/17/2019   HDL 51 01/17/2019   LDLCALC 97 01/17/2019   TRIG 108 01/17/2019   CHOLHDL 3.3 01/17/2019       Also, the patient has history of PreDiabetes (A1c 6.0% / Mar 2017)  and has had no symptoms of reactive hypoglycemia, diabetic polys, paresthesias or visual blurring.  Last A1c was not at goal: Lab Results  Component Value Date   HGBA1C 6.1 (H) 01/17/2019       Further, the patient also has history of Vitamin D Deficiency  ("23" / 2008)  and supplements vitamin D without any suspected side-effects. Last vitamin D was Lab Results  Component Value Date   VD25OH 39 01/17/2019    Current Outpatient Medications on File Prior to Visit  Medication Sig  . Ascorbic Acid (VITAMIN C PO) Take 1 capsule by mouth daily.   . ASPIRIN PO Take 81 mg by mouth daily.  Marland Kitchen atorvastatin (LIPITOR) 20 MG tablet Take 1 tablet (20 mg total) by mouth at bedtime.  Marland Kitchen azelastine (OPTIVAR) 0.05 % ophthalmic solution Place 1 drop into both eyes 2 (two) times daily as needed. (Patient taking differently: Place 1 drop into both eyes 2 (two) times daily as needed (allergies). )  . Cholecalciferol (VITAMIN D  PO) Take 5,000 Units by mouth daily.   Marland Kitchen ezetimibe (ZETIA) 10 MG tablet Take 1 tablet (10 mg total) by mouth daily.  . fludrocortisone (FLORINEF) 0.1 MG tablet Take 1 tablet 2 x /day for Low  BP  . Omega-3 Fatty Acids (FISH OIL PO) Take 1 capsule by mouth daily.    No current facility-administered medications on file prior to visit.     Allergies  Allergen Reactions  . Codeine Nausea And Vomiting    "Feels like I have the flu"    PMHx:   Past Medical History:  Diagnosis Date  . Allergy   . Cancer of right breast ([place]) 1992  . Depression   . Hyperlipidemia   . Hypertension   . Vitamin D deficiency    Immunization History  Administered Date(s) Administered  . DT 12/23/2014  . Influenza-Unspecified 04/02/2016, 03/16/2018  . Pneumococcal Conjugate-13 12/17/2013  . Pneumococcal Polysaccharide-23 10/23/2009  . Td 04/15/2005  . Zoster 11/19/2010   Past Surgical History:  Procedure Laterality Date  . ABDOMINAL HYSTERECTOMY     partial  . BREAST LUMPECTOMY Right   . CATARACT EXTRACTION, BILATERAL Bilateral 2016  . TONSILLECTOMY AND ADENOIDECTOMY      FHx:    Reviewed / unchanged  SHx:    Reviewed / unchanged  Systems Review:  Constitutional: Denies fever, chills, wt changes, headaches, insomnia, fatigue, night sweats, change in appetite. Eyes: Denies redness, blurred vision, diplopia, discharge, itchy, watery eyes.  ENT: Denies discharge, congestion, post nasal drip, epistaxis, sore throat, earache, hearing loss, dental pain, tinnitus, vertigo, sinus pain, snoring.  CV: Denies chest pain, palpitations, irregular heartbeat, syncope, dyspnea, diaphoresis, orthopnea, PND, claudication or edema. Respiratory: denies cough, dyspnea, DOE, pleurisy, hoarseness, laryngitis, wheezing.  Gastrointestinal: Denies dysphagia, odynophagia, heartburn, reflux, water brash, abdominal pain or cramps, nausea, vomiting, bloating, diarrhea, constipation, hematemesis, melena, hematochezia  or  hemorrhoids. Genitourinary: Denies dysuria, frequency, urgency, nocturia, hesitancy, discharge, hematuria or flank pain. Musculoskeletal: Denies arthralgias, myalgias, stiffness, jt. swelling, pain, limping or strain/sprain.  Skin: Denies pruritus, rash, hives, warts, acne, eczema or change in skin lesion(s). Neuro: No weakness, tremor, incoordination, spasms, paresthesia or pain. Psychiatric: Denies confusion, memory loss or sensory loss. Endo: Denies change in weight, skin or hair change.  Heme/Lymph: No excessive bleeding, bruising or enlarged lymph nodes.  Physical Exam  BP 140/68   Pulse 60   Temp (!) 97 F (36.1 C)   Resp 16   Ht 5' 4.5" (1.638 m)   Wt 131 lb 12.8 oz (59.8 kg)   BMI 22.27 kg/m   Appears  well nourished, well groomed  and in no distress.  Eyes: PERRLA, EOMs, conjunctiva no swelling or erythema. Sinuses: No frontal/maxillary tenderness ENT/Mouth: EAC's clear, TM's nl w/o erythema, bulging. Nares clear w/o erythema, swelling, exudates. Oropharynx clear without erythema or exudates. Oral hygiene is good. Tongue normal, non obstructing. Hearing intact.  Neck: Supple. Thyroid not palpable. Car 2+/2+ without bruits, nodes or JVD. Chest: Respirations nl with BS clear & equal w/o rales, rhonchi, wheezing or stridor.  Cor: Heart sounds normal w/ regular rate and rhythm without sig. murmurs, gallops, clicks or rubs. Peripheral pulses normal and equal  without edema.  Abdomen: Soft & bowel sounds normal. Non-tender w/o guarding, rebound, hernias, masses or organomegaly.  Lymphatics: Unremarkable.  Musculoskeletal: Full ROM all peripheral extremities, joint stability, 5/5 strength and normal gait.  Skin: Warm, dry without exposed rashes, lesions or ecchymosis apparent.  Neuro: Cranial nerves intact, reflexes equal bilaterally. Sensory-motor testing grossly intact. Tendon reflexes grossly intact.  Pysch: Alert & oriented x 3.  Insight and judgement nl & appropriate. No  ideations.  Assessment and Plan:  1. Essential hypertension  - Continue medication, monitor blood pressure at home.  - Continue DASH diet.  Reminder to go to the ER if any CP,  SOB, nausea, dizziness, severe HA, changes vision/speech.  - CBC with Differential/Platelet - COMPLETE METABOLIC PANEL WITH GFR - Magnesium - TSH  2. Hyperlipidemia, mixed  - Continue diet/meds, exercise,& lifestyle modifications.  - Continue monitor periodic cholesterol/liver & renal functions   - Lipid panel - TSH  3. Abnormal glucose  - Continue diet, exercise  - Lifestyle modifications.  - Monitor appropriate labs.  - Hemoglobin A1c - Insulin, random  4. Vitamin D deficiency  - Continue supplementation.  - VITAMIN D 25 Hydroxyl  5. Prediabetes  - Hemoglobin A1c - Insulin, random  6. Medication management  - CBC with Differential/Platelet - COMPLETE METABOLIC PANEL WITH GFR - Magnesium - Lipid panel - TSH - Hemoglobin A1c - Insulin, random - VITAMIN D 25 Hydroxyl       Discussed  regular exercise, BP monitoring, weight control to achieve/maintain BMI less than 25 and discussed med and SE's. Recommended labs to assess and monitor clinical status with further disposition pending  results of labs.  I discussed the assessment and treatment plan with the patient. The patient was provided an opportunity to ask questions and all were answered. The patient agreed with the plan and demonstrated an understanding of the instructions.  I provided over 30 minutes of exam, counseling, chart review and  complex critical decision making.   Kirtland Bouchard, MD

## 2019-01-18 LAB — CBC WITH DIFFERENTIAL/PLATELET
Absolute Monocytes: 536 cells/uL (ref 200–950)
Basophils Absolute: 13 cells/uL (ref 0–200)
Basophils Relative: 0.2 %
Eosinophils Absolute: 158 cells/uL (ref 15–500)
Eosinophils Relative: 2.5 %
HCT: 43.4 % (ref 35.0–45.0)
Hemoglobin: 14.3 g/dL (ref 11.7–15.5)
Lymphs Abs: 1657 cells/uL (ref 850–3900)
MCH: 29.1 pg (ref 27.0–33.0)
MCHC: 32.9 g/dL (ref 32.0–36.0)
MCV: 88.2 fL (ref 80.0–100.0)
MPV: 11.4 fL (ref 7.5–12.5)
Monocytes Relative: 8.5 %
Neutro Abs: 3938 cells/uL (ref 1500–7800)
Neutrophils Relative %: 62.5 %
Platelets: 340 10*3/uL (ref 140–400)
RBC: 4.92 10*6/uL (ref 3.80–5.10)
RDW: 13.3 % (ref 11.0–15.0)
Total Lymphocyte: 26.3 %
WBC: 6.3 10*3/uL (ref 3.8–10.8)

## 2019-01-18 LAB — LIPID PANEL
Cholesterol: 168 mg/dL (ref <200)
HDL: 51 mg/dL (ref >=50)
LDL Cholesterol (Calc): 97 mg/dL (calc)
Non-HDL Cholesterol (Calc): 117 mg/dL (calc) (ref <130)
Total CHOL/HDL Ratio: 3.3 (calc) (ref <5.0)
Triglycerides: 108 mg/dL (ref <150)

## 2019-01-18 LAB — COMPLETE METABOLIC PANEL WITH GFR
AG Ratio: 2 (calc) (ref 1.0–2.5)
ALT: 19 U/L (ref 6–29)
AST: 21 U/L (ref 10–35)
Albumin: 4.5 g/dL (ref 3.6–5.1)
Alkaline phosphatase (APISO): 99 U/L (ref 37–153)
BUN/Creatinine Ratio: 18 (calc) (ref 6–22)
BUN: 17 mg/dL (ref 7–25)
CO2: 27 mmol/L (ref 20–32)
Calcium: 9.7 mg/dL (ref 8.6–10.4)
Chloride: 105 mmol/L (ref 98–110)
Creat: 0.94 mg/dL — ABNORMAL HIGH (ref 0.60–0.93)
GFR, Est African American: 67 mL/min/{1.73_m2} (ref >=60)
GFR, Est Non African American: 58 mL/min/{1.73_m2} — ABNORMAL LOW (ref >=60)
Globulin: 2.3 g/dL (calc) (ref 1.9–3.7)
Glucose, Bld: 100 mg/dL — ABNORMAL HIGH (ref 65–99)
Potassium: 4.7 mmol/L (ref 3.5–5.3)
Sodium: 140 mmol/L (ref 135–146)
Total Bilirubin: 0.6 mg/dL (ref 0.2–1.2)
Total Protein: 6.8 g/dL (ref 6.1–8.1)

## 2019-01-18 LAB — TSH: TSH: 1.54 mIU/L (ref 0.40–4.50)

## 2019-01-18 LAB — INSULIN, RANDOM: Insulin: 8.4 u[IU]/mL

## 2019-01-18 LAB — HEMOGLOBIN A1C
Hgb A1c MFr Bld: 6.1 % of total Hgb — ABNORMAL HIGH (ref <5.7)
Mean Plasma Glucose: 128 (calc)
eAG (mmol/L): 7.1 (calc)

## 2019-01-18 LAB — VITAMIN D 25 HYDROXY (VIT D DEFICIENCY, FRACTURES): Vit D, 25-Hydroxy: 70 ng/mL (ref 30–100)

## 2019-01-18 LAB — MAGNESIUM: Magnesium: 2.1 mg/dL (ref 1.5–2.5)

## 2019-01-20 ENCOUNTER — Encounter: Payer: Self-pay | Admitting: Internal Medicine

## 2019-02-11 ENCOUNTER — Other Ambulatory Visit: Payer: Self-pay | Admitting: Internal Medicine

## 2019-04-23 DIAGNOSIS — S01552A Open bite of oral cavity, initial encounter: Secondary | ICD-10-CM | POA: Diagnosis not present

## 2019-04-25 NOTE — Progress Notes (Signed)
FOLLOW UP  Assessment and Plan:   Essential hypertension -     metoprolol succinate (TOPROL XL) 25 MG 24 hr tablet; Take 1 tablet (25 mg total) by mouth daily. -     CBC with Differential/Platelet -     COMPLETE METABOLIC PANEL WITH GFR -     TSH - continue medications, DASH diet, exercise and monitor at home. Call if greater than 130/80.  - has been on 1/2 without hypotension, will continue lower dose for BP and migraine prevention- she will stop if she has any symptoms  Mixed hyperlipidemia -     Lipid panel check lipids decrease fatty foods increase activity.   Vitamin D deficiency Continue same, defer level today  Other abnormal glucose -     Hemoglobin A1c Discussed disease progression and risks Discussed diet/exercise, weight management and risk modification  Depression, major, in remission (HCC) Remission  Eczema of both external ears -     triamcinolone cream (KENALOG) 0.5 %; Apply 1 application topically 2 (two) times daily.    Continue diet and meds as discussed. Further disposition pending results of labs. Discussed med's effects and SE's.   Over 30 minutes of exam, counseling, chart review, and critical decision making was performed.   Future Appointments  Date Time Provider [place]  07/27/2019 10:00 AM Unk Pinto, MD [facility] None  11/05/2019  2:00 PM Liane Comber, NP [facility] None    ----------------------------------------------------------------------------------------------------------------------  HPI 78 y.o. female  presents for 3 month follow up on hypertension, cholesterol, prediabetes, weight and vitamin D deficiency.   The patient also has hx of major depression, currently in remission off of medication.  She use to have migraines, years ago, was put on metroprolol for prevention, was taken off in the spring due to hypotension, she has not had anymore headaches but is fearful of them coming back. She has been on metorpolol  1/2 50mg  and states she has been doing well with this.   BMI is Body mass index is 24.55 kg/m., she has not been working on diet and exercise. Wt Readings from Last 3 Encounters:  04/26/19 134 lb 3.2 oz (60.9 kg)  01/17/19 131 lb 12.8 oz (59.8 kg)  12/25/18 132 lb (59.9 kg)   Today their BP is BP: 118/74  She does not workout. She denies chest pain, shortness of breath, dizziness.   She is prescribed atorvastatin 20 mg and zetia. Her cholesterol is not at goal.  Lab Results  Component Value Date   CHOL 168 01/17/2019   HDL 51 01/17/2019   LDLCALC 97 01/17/2019   TRIG 108 01/17/2019   CHOLHDL 3.3 01/17/2019    She has not been working on diet and exercise for prediabetes, and denies increased appetite, nausea, paresthesia of the feet, polydipsia, polyuria, visual disturbances and vomiting. Last A1C in the office was:  Lab Results  Component Value Date   HGBA1C 6.1 (H) 01/17/2019   Patient is on Vitamin D supplement but remains below goal at the last check:    Lab Results  Component Value Date   VD25OH 68 01/17/2019        Current Medications:  Current Outpatient Medications on File Prior to Visit  Medication Sig  . Ascorbic Acid (VITAMIN C PO) Take 1 capsule by mouth daily.   . ASPIRIN PO Take 81 mg by mouth daily.  Marland Kitchen atorvastatin (LIPITOR) 20 MG tablet Take 1 tablet (20 mg total) by mouth at bedtime.  Marland Kitchen azelastine (OPTIVAR) 0.05 % ophthalmic  solution Place 1 drop into both eyes 2 (two) times daily as needed. (Patient taking differently: Place 1 drop into both eyes 2 (two) times daily as needed (allergies). )  . Cholecalciferol (VITAMIN D PO) Take 5,000 Units by mouth daily.   Marland Kitchen ezetimibe (ZETIA) 10 MG tablet Take 1 tablet Daily for Cholesterol  . fludrocortisone (FLORINEF) 0.1 MG tablet Take 1 tablet 2 x /day for Low  BP  . Omega-3 Fatty Acids (FISH OIL PO) Take 1 capsule by mouth daily.    No current facility-administered medications on file prior to visit.       Allergies:  Allergies  Allergen Reactions  . Codeine Nausea And Vomiting    "Feels like I have the flu"     Medical History:  Past Medical History:  Diagnosis Date  . Allergy   . Cancer of right breast ([place]) 1992  . Depression   . Hyperlipidemia   . Hypertension   . Vitamin D deficiency    Family history- Reviewed and unchanged Social history- Reviewed and unchanged   Review of Systems:  Review of Systems  Constitutional: Negative for malaise/fatigue and weight loss.  HENT: Negative for hearing loss and tinnitus.   Eyes: Negative for blurred vision and double vision.  Respiratory: Negative for cough, hemoptysis, sputum production, shortness of breath and wheezing.   Cardiovascular: Negative for chest pain, palpitations, orthopnea, claudication and leg swelling.  Gastrointestinal: Negative for abdominal pain, blood in stool, constipation, diarrhea, heartburn, melena, nausea and vomiting.  Genitourinary: Negative.   Musculoskeletal: Negative for joint pain and myalgias.  Skin: Negative for rash.  Neurological: Negative for dizziness, tingling, sensory change, weakness and headaches.  Endo/Heme/Allergies: Negative for polydipsia.  Psychiatric/Behavioral: Negative.   All other systems reviewed and are negative.   Physical Exam: BP 118/74   Pulse 69   Temp (!) 97.5 F (36.4 C)   Ht 5\' 2"  (1.575 m)   Wt 134 lb 3.2 oz (60.9 kg)   SpO2 96%   BMI 24.55 kg/m  Wt Readings from Last 3 Encounters:  04/26/19 134 lb 3.2 oz (60.9 kg)  01/17/19 131 lb 12.8 oz (59.8 kg)  12/25/18 132 lb (59.9 kg)   General Appearance: Well nourished, in no apparent distress. Eyes: PERRLA, EOMs, conjunctiva no swelling or erythema Sinuses: No Frontal/maxillary tenderness ENT/Mouth: Ext aud canals with dry, erythematous flaky skin, TMs without erythema, bulging. No erythema, swelling, or exudate on post pharynx.  Tonsils not swollen or erythematous. Hearing normal.  Neck: Supple, thyroid  normal.  Respiratory: Respiratory effort normal, BS equal bilaterally without rales, rhonchi, wheezing or stridor.  Cardio: RRR with no MRGs. Brisk peripheral pulses without edema.  Abdomen: Soft, + BS.  Non tender, no guarding, rebound, hernias, masses. Lymphatics: Non tender without lymphadenopathy.  Musculoskeletal: Full ROM, 5/5 strength, Normal gait Skin: Warm, dry without rashes, lesions, ecchymosis.  Neuro: Cranial nerves intact. No cerebellar symptoms.  Psych: Awake and oriented X 3, normal affect, Insight and Judgment appropriate.    Vicie Mutters, PA-C 10:57 AM [facility]

## 2019-04-26 ENCOUNTER — Other Ambulatory Visit: Payer: Self-pay

## 2019-04-26 ENCOUNTER — Encounter: Payer: Self-pay | Admitting: Physician Assistant

## 2019-04-26 ENCOUNTER — Ambulatory Visit (INDEPENDENT_AMBULATORY_CARE_PROVIDER_SITE_OTHER): Payer: Medicare Other | Admitting: Physician Assistant

## 2019-04-26 VITALS — BP 118/74 | HR 69 | Temp 97.5°F | Ht 62.0 in | Wt 134.2 lb

## 2019-04-26 DIAGNOSIS — F325 Major depressive disorder, single episode, in full remission: Secondary | ICD-10-CM

## 2019-04-26 DIAGNOSIS — Z79899 Other long term (current) drug therapy: Secondary | ICD-10-CM

## 2019-04-26 DIAGNOSIS — I10 Essential (primary) hypertension: Secondary | ICD-10-CM | POA: Diagnosis not present

## 2019-04-26 DIAGNOSIS — R7309 Other abnormal glucose: Secondary | ICD-10-CM | POA: Diagnosis not present

## 2019-04-26 DIAGNOSIS — H60543 Acute eczematoid otitis externa, bilateral: Secondary | ICD-10-CM

## 2019-04-26 DIAGNOSIS — E559 Vitamin D deficiency, unspecified: Secondary | ICD-10-CM

## 2019-04-26 DIAGNOSIS — E782 Mixed hyperlipidemia: Secondary | ICD-10-CM | POA: Diagnosis not present

## 2019-04-26 MED ORDER — TRIAMCINOLONE ACETONIDE 0.5 % EX CREA: 1.0000 "application " | TOPICAL_CREAM | Freq: Two times a day (BID) | CUTANEOUS | 2 refills | Status: DC

## 2019-04-26 MED ORDER — METOPROLOL SUCCINATE ER 25 MG PO TB24: 25.0000 mg | ORAL_TABLET | Freq: Every day | ORAL | 3 refills | Status: DC

## 2019-04-26 NOTE — Patient Instructions (Signed)
Eczema Eczema is a broad term for a group of skin conditions that cause skin to become rough and inflamed. Each type of eczema has different triggers, symptoms, and treatments. Eczema of any type is usually itchy and symptoms range from mild to severe. Eczema and its symptoms are not spread from person to person (are not contagious). It can appear on different parts of the body at different times. Your eczema may not look the same as someone else's eczema. What are the types of eczema? Atopic dermatitis This is a long-term (chronic) skin disease that keeps coming back (recurring). Usual symptoms are dry skin and small, solid pimples that may swell and leak fluid (weep). Contact dermatitis  This happens when something irritates the skin and causes a rash. The irritation can come from substances that you are allergic to (allergens), such as poison ivy, chemicals, or medicines that were applied to your skin. Dyshidrotic eczema This is a form of eczema on the hands and feet. It shows up as very itchy, fluid-filled blisters. It can affect people of any age, but is more common before age 40. Hand eczema  This causes very itchy areas of skin on the palms and sides of the hands and fingers. This type of eczema is common in industrial jobs where you may be exposed to many different types of irritants. Lichen simplex chronicus This type of eczema occurs when a person constantly scratches one area of the body. Repeated scratching of the area leads to thickened skin (lichenification). Lichen simplex chronicus can occur along with other types of eczema. It is more common in adults, but may be seen in children as well. Nummular eczema This is a common type of eczema. It has no known cause. It typically causes a red, circular, crusty lesion (plaque) that may be itchy. Scratching may become a habit and can cause bleeding. Nummular eczema occurs most often in people of middle-age or older. It most often affects the  hands. Seborrheic dermatitis This is a common skin disease that mainly affects the scalp. It may also affect any oily areas of the body, such as the face, sides of nose, eyebrows, ears, eyelids, and chest. It is marked by small scaling and redness of the skin (erythema). This can affect people of all ages. In infants, this condition is known as "cradle cap." Stasis dermatitis This is a common skin disease that usually appears on the legs and feet. It most often occurs in people who have a condition that prevents blood from being pumped through the veins in the legs (chronic venous insufficiency). Stasis dermatitis is a chronic condition that needs long-term management. How is eczema diagnosed? Your health care provider will examine your skin and review your medical history. He or she may also give you skin patch tests. These tests involve taking patches that contain possible allergens and placing them on your back. He or she will then check in a few days to see if an allergic reaction occurred. What are the common treatments? Treatment for eczema is based on the type of eczema you have. Hydrocortisone steroid medicine can relieve itching quickly and help reduce inflammation. This medicine may be prescribed or obtained over-the-counter, depending on the strength of the medicine that is needed. Follow these instructions at home:  Take over-the-counter and prescription medicines only as told by your health care provider.  Use creams or ointments to moisturize your skin. Do not use lotions.  Learn what triggers or irritates your symptoms. Avoid these things.    Treat symptom flare-ups quickly.  Do not itch your skin. This can make your rash worse.  Keep all follow-up visits as told by your health care provider. This is important. Where to find more information  The American Academy of Dermatology: http://jones-macias.info/  The National Eczema Association: www.nationaleczema.org Contact a health care provider  if:  You have serious itching, even with treatment.  You regularly scratch your skin until it bleeds.  Your rash looks different than usual.  Your skin is painful, swollen, or more red than usual.  You have a fever. Summary  There are eight general types of eczema. Each type has different triggers.  Eczema of any type causes itching that may range from mild to severe.  Treatment varies based on the type of eczema you have. Hydrocortisone steroid medicine can help with itching and inflammation.  Protecting your skin is the best way to prevent eczema. Use moisturizers and lotions. Avoid triggers and irritants, and treat flare-ups quickly. This information is not intended to replace advice given to you by your health care provider. Make sure you discuss any questions you have with your health care provider. Document Released: 10/28/2016 Document Revised: 05/27/2017 Document Reviewed: 10/28/2016 Elsevier Patient Education  [place].   1.  Limit use of pain relievers to no more than 2 days out of week to prevent risk of rebound or medication-overuse headache. 2.  Keep headache diary 3.  Exercise, hydration, caffeine cessation, sleep hygiene, monitor for and avoid triggers 4.  Consider:  magnesium citrate 400mg  daily, riboflavin 400mg  daily, and coenzyme Q10 100mg  three times daily   We may also treat TMJ if we think you have it If you are having frequent migraines we may put you on a once a day medication with fast acting medication to take. Also there is such a thing called rebound headache from over use of acute medications.  Please do not use rescue or acute medications more than 10 days a month or more than 3 days per week, this can cause a withdrawal and a rebound headache.  Here is more information below  Please remember, common headache triggers are: sleep deprivation, dehydration, overheating, stress, hypoglycemia or skipping meals and blood sugar fluctuations,  excessive pain medications or excessive alcohol use or caffeine withdrawal. Some people have food triggers such as aged cheese, orange juice or chocolate, especially dark chocolate, or MSG (monosodium glutamate). Try to avoid these headache triggers as much possible.   It may be helpful to keep a headache diary to figure out what makes your headaches worse or brings them on and what alleviates them. Some people report headache onset after exercise but studies have shown that regular exercise may actually prevent headaches from coming. If you have exercise-induced headaches, please make sure that you drink plenty of fluid before and after exercising and that you do not over do it and do not overheat.   Please go to the ER if there is weakness, thunderclap headache, visual changes, or any concerning factors    Migraine Headache A migraine headache is an intense, throbbing pain on one or both sides of your head. Recurrent migraines keep coming back. A migraine can last for 30 minutes to several hours. CAUSES  The exact cause of a migraine headache is not always known. However, a migraine may be caused when nerves in the brain become irritated and release chemicals that cause inflammation. This causes pain. Certain things may also trigger migraines, such as:   Alcohol.  Smoking.  Stress.  Menstruation.  Aged cheeses.  Foods or drinks that contain nitrates, glutamate, aspartame, or tyramine.  Lack of sleep.  Chocolate.  Caffeine.  Hunger.  Physical exertion.  Fatigue.  Medicines used to treat chest pain (nitroglycerine), birth control pills, estrogen, and some blood pressure medicines. SYMPTOMS   Pain on one or both sides of your head.  Pulsating or throbbing pain.  Severe pain that prevents daily activities.  Pain that is aggravated by any physical activity.  Nausea, vomiting, or both.  Dizziness.  Pain with exposure to bright lights, loud noises, or activity.   General sensitivity to bright lights, loud noises, or smells. Before you get a migraine, you may get warning signs that a migraine is coming (aura). An aura may include:  Seeing flashing lights.  Seeing bright spots, halos, or zigzag lines.  Having tunnel vision or blurred vision.  Having feelings of numbness or tingling.  Having trouble talking.  Having muscle weakness. DIAGNOSIS  A recurrent migraine headache is often diagnosed based on:  Symptoms.  Physical examination.  A CT scan or MRI of your head. These imaging tests cannot diagnose migraines but can help rule out other causes of headaches.   TREATMENT  Medicines may be given for pain and nausea. Medicines can also be given to help prevent recurrent migraines. HOME CARE INSTRUCTIONS  Only take over-the-counter or prescription medicines for pain or discomfort as directed by your health care provider. The use of long-term narcotics is not recommended.  Lie down in a dark, quiet room when you have a migraine.  Keep a journal to find out what may trigger your migraine headaches. For example, write down:  What you eat and drink.  How much sleep you get.  Any change to your diet or medicines.  Limit alcohol consumption.  Quit smoking if you smoke.  Get 7-9 hours of sleep, or as recommended by your health care provider.  Limit stress.  Keep lights dim if bright lights bother you and make your migraines worse. SEEK MEDICAL CARE IF:   You do not get relief from the medicines given to you.  You have a recurrence of pain.  You have a fever. SEEK IMMEDIATE MEDICAL CARE IF:  Your migraine becomes severe.  You have a stiff neck.  You have loss of vision.  You have muscular weakness or loss of muscle control.  You start losing your balance or have trouble walking.  You feel faint or pass out. . You have severe symptoms that are different from your first symptoms. MAKE SURE YOU:   Understand these  instructions.  Will watch your condition.  Will get help right away if you are not doing well or get worse.   This information is not intended to replace advice given to you by your health care provider. Make sure you discuss any questions you have with your health care provider.   Document Released: 03/09/2001 Document Revised: 07/05/2014 Document Reviewed: 02/19/2013 Elsevier Interactive Patient Education 2016 [employer].  Common Migraine Triggers   Foods Aged cheese, alcohol, nuts, chocolate, yogurt, onions, figs, liver, caffeinated foods and beverages, monosodium glutamate (MSG), smoked or pickled fish/meat, nitrate/nitrate preserved foods (hotdogs, pepperoni, salami) tyramine  Medications Antibiotics (tetracycline, griseofulvin), antihypertensives (nifedipine, captopril), hormones (oral contraceptives, estrogens), histamine-2 blockers (cimetidine, raniidine, vasodilators (nitroglycerine, isosorbide dinitrate)  Sensory Stimuli Flickering/bright/fluorescent lights, bright sunlight, odors (perfume, chemicals, cigarette smoke)  Lifestyle Changes Time zones, sleep patterns, eating habits, caffeine withdrawal stress  Other Menstrual cycle, weather/season/air  pressure changes, high altitude  Adapted from [place] and [place], [place]. [facility] [place]; [place] and [place]. Conquering Headache, 1998  Hormonal variations also are believed to play a part.  Fluctuations of the female hormone estrogen (such as just before menstruation) affect a chemical called serotonin-when serotonin levels in the brain fall, the dilation (expansion) of blood vessels in the brain that is characteristic of migraine often follows.  Many factors or "triggers" can start a migraine.  In people who get migraines, most experts think certain activities or foods may trigger temporary changes in the blood vessels around the brain.  Swelling of these blood vessels may cause pain in the nearby nerves.  Allergy Headaches:   Hotdogs Milk  Onions  Thyme Bacon  Chocolate Garlic  Nutmeg Ham  Dark Cola Pork  Cinnamon Salami  Nuts  Egg  Ginger Sausage Red wine Cloves  Cheddar Cheese Caffeine

## 2019-04-27 LAB — CBC WITH DIFFERENTIAL/PLATELET
Absolute Monocytes: 630 cells/uL (ref 200–950)
Basophils Absolute: 23 cells/uL (ref 0–200)
Basophils Relative: 0.3 %
Eosinophils Absolute: 323 cells/uL (ref 15–500)
Eosinophils Relative: 4.3 %
HCT: 44.3 % (ref 35.0–45.0)
Hemoglobin: 14.7 g/dL (ref 11.7–15.5)
Lymphs Abs: 1935 cells/uL (ref 850–3900)
MCH: 29.1 pg (ref 27.0–33.0)
MCHC: 33.2 g/dL (ref 32.0–36.0)
MCV: 87.5 fL (ref 80.0–100.0)
MPV: 11 fL (ref 7.5–12.5)
Monocytes Relative: 8.4 %
Neutro Abs: 4590 cells/uL (ref 1500–7800)
Neutrophils Relative %: 61.2 %
Platelets: 391 10*3/uL (ref 140–400)
RBC: 5.06 10*6/uL (ref 3.80–5.10)
RDW: 13.5 % (ref 11.0–15.0)
Total Lymphocyte: 25.8 %
WBC: 7.5 10*3/uL (ref 3.8–10.8)

## 2019-04-27 LAB — HEMOGLOBIN A1C
Hgb A1c MFr Bld: 5.9 % of total Hgb — ABNORMAL HIGH (ref <5.7)
Mean Plasma Glucose: 123 (calc)
eAG (mmol/L): 6.8 (calc)

## 2019-04-27 LAB — COMPLETE METABOLIC PANEL WITH GFR
AG Ratio: 1.8 (calc) (ref 1.0–2.5)
ALT: 26 U/L (ref 6–29)
AST: 26 U/L (ref 10–35)
Albumin: 4.6 g/dL (ref 3.6–5.1)
Alkaline phosphatase (APISO): 101 U/L (ref 37–153)
BUN: 20 mg/dL (ref 7–25)
CO2: 30 mmol/L (ref 20–32)
Calcium: 9.9 mg/dL (ref 8.6–10.4)
Chloride: 104 mmol/L (ref 98–110)
Creat: 0.93 mg/dL (ref 0.60–0.93)
GFR, Est African American: 68 mL/min/{1.73_m2} (ref >=60)
GFR, Est Non African American: 58 mL/min/{1.73_m2} — ABNORMAL LOW (ref >=60)
Globulin: 2.6 g/dL (calc) (ref 1.9–3.7)
Glucose, Bld: 100 mg/dL — ABNORMAL HIGH (ref 65–99)
Potassium: 4.6 mmol/L (ref 3.5–5.3)
Sodium: 139 mmol/L (ref 135–146)
Total Bilirubin: 0.9 mg/dL (ref 0.2–1.2)
Total Protein: 7.2 g/dL (ref 6.1–8.1)

## 2019-04-27 LAB — LIPID PANEL
Cholesterol: 176 mg/dL (ref <200)
HDL: 51 mg/dL (ref >=50)
LDL Cholesterol (Calc): 104 mg/dL (calc) — ABNORMAL HIGH
Non-HDL Cholesterol (Calc): 125 mg/dL (calc) (ref <130)
Total CHOL/HDL Ratio: 3.5 (calc) (ref <5.0)
Triglycerides: 117 mg/dL (ref <150)

## 2019-04-27 LAB — TSH: TSH: 1.24 mIU/L (ref 0.40–4.50)

## 2019-05-01 DIAGNOSIS — K08402 Partial loss of teeth, unspecified cause, class II: Secondary | ICD-10-CM | POA: Diagnosis not present

## 2019-05-28 ENCOUNTER — Telehealth: Payer: Self-pay

## 2019-05-28 NOTE — Telephone Encounter (Signed)
Patient has been made aware of information

## 2019-05-28 NOTE — Telephone Encounter (Signed)
-----   Message from Unk Pinto, MD sent at 05/28/2019  1:08 PM EST ----- Regarding: RE: question Contact: [phone]  - Please explain to patient that the 2 medicines work very differently to help lower Cholesterol to prevent Heart Attacks, Strokes  & Dementia  And   Neither medicine alone lowered cholesterol to a safe / desired level, so It takes both medicines to help lower cholesterol to a safe level   And  remind Also   - Cholesterol only comes from animal sources - ie. meat, dairy, egg yolks   - Eat all the vegetables she wants.  - Avoid meat, especially red meat - Beef AND Pork .  - Avoid cheese & dairy - milk & ice cream.     - Cheese is the most concentrated form of trans-fats which is the worst thing to clog up herarteries.   - Veggie cheese is OK which can be found in the fresh produce section at [employer] or [employer] or [employer]    ----- Message ----- From: Elenor Quinones, CMA Sent: 05/28/2019  12:39 PM EST To: Unk Pinto, MD Subject: question                                       Patient would like to know why she is taking 2 meds for [place]?  PLEASE ADVISE

## 2019-06-26 ENCOUNTER — Encounter: Payer: Self-pay | Admitting: Internal Medicine

## 2019-07-17 DIAGNOSIS — Z1231 Encounter for screening mammogram for malignant neoplasm of breast: Secondary | ICD-10-CM | POA: Diagnosis not present

## 2019-07-17 LAB — HM MAMMOGRAPHY

## 2019-07-19 ENCOUNTER — Encounter: Payer: Self-pay | Admitting: Internal Medicine

## 2019-07-27 ENCOUNTER — Ambulatory Visit (INDEPENDENT_AMBULATORY_CARE_PROVIDER_SITE_OTHER): Payer: Medicare HMO | Admitting: Internal Medicine

## 2019-07-27 ENCOUNTER — Other Ambulatory Visit: Payer: Self-pay

## 2019-07-27 ENCOUNTER — Other Ambulatory Visit: Payer: Self-pay | Admitting: *Deleted

## 2019-07-27 ENCOUNTER — Encounter: Payer: Self-pay | Admitting: Internal Medicine

## 2019-07-27 VITALS — BP 118/70 | HR 60 | Temp 97.0°F | Resp 16 | Ht 64.5 in | Wt 131.8 lb

## 2019-07-27 DIAGNOSIS — I951 Orthostatic hypotension: Secondary | ICD-10-CM

## 2019-07-27 DIAGNOSIS — Z79899 Other long term (current) drug therapy: Secondary | ICD-10-CM | POA: Diagnosis not present

## 2019-07-27 DIAGNOSIS — E559 Vitamin D deficiency, unspecified: Secondary | ICD-10-CM

## 2019-07-27 DIAGNOSIS — I10 Essential (primary) hypertension: Secondary | ICD-10-CM | POA: Diagnosis not present

## 2019-07-27 DIAGNOSIS — Z1212 Encounter for screening for malignant neoplasm of rectum: Secondary | ICD-10-CM | POA: Diagnosis not present

## 2019-07-27 DIAGNOSIS — R7309 Other abnormal glucose: Secondary | ICD-10-CM

## 2019-07-27 DIAGNOSIS — R0989 Other specified symptoms and signs involving the circulatory and respiratory systems: Secondary | ICD-10-CM | POA: Diagnosis not present

## 2019-07-27 DIAGNOSIS — Z0000 Encounter for general adult medical examination without abnormal findings: Secondary | ICD-10-CM

## 2019-07-27 DIAGNOSIS — E782 Mixed hyperlipidemia: Secondary | ICD-10-CM

## 2019-07-27 DIAGNOSIS — Z0001 Encounter for general adult medical examination with abnormal findings: Secondary | ICD-10-CM

## 2019-07-27 DIAGNOSIS — Z8249 Family history of ischemic heart disease and other diseases of the circulatory system: Secondary | ICD-10-CM

## 2019-07-27 DIAGNOSIS — Z136 Encounter for screening for cardiovascular disorders: Secondary | ICD-10-CM

## 2019-07-27 DIAGNOSIS — Z1211 Encounter for screening for malignant neoplasm of colon: Secondary | ICD-10-CM

## 2019-07-27 MED ORDER — EZETIMIBE 10 MG PO TABS: ORAL_TABLET | ORAL | 3 refills | Status: DC

## 2019-07-27 NOTE — Patient Instructions (Signed)

## 2019-07-27 NOTE — Progress Notes (Signed)
Annual Screening/Preventative Visit & Comprehensive Evaluation &  Examination     This very nice 79 y.o. MWF presents for a Screening /Preventative Visit & comprehensive evaluation and management of multiple medical co-morbidities.  Patient has been followed for HTN, HLD, Prediabetes  and Vitamin D Deficiency.  In May 2020,  patient was hospitalized to r/o CVA and was dx'd with TGA and Brain scans were negative. Remote hx/o RtBreast Cancer in 1992.      HTN predates since 2004. Patient has hx/o of 2 negative stress Cardiolite scans (2005 & 2011). Last April 2020, her BP's dropped & her BP meds were d/c'd & she was prescribed Florinef which she never filled. Subsequently her BP elevated & she restarted Her Metoprolol.   Patient's BP has been controlled at home and patient denies any cardiac symptoms as chest pain, palpitations, shortness of breath, dizziness or ankle swelling. Today's BP is at goal- 118/70.      Patient's hyperlipidemia is controlled with diet and medications. Patient denies myalgias or other medication SE's. Last lipids were near goal:  Lab Results  Component Value Date   CHOL 136 07/27/2019   HDL 55 07/27/2019   LDLCALC 64 07/27/2019   TRIG 83 07/27/2019   CHOLHDL 2.5 07/27/2019       Patient has hx/o prediabetes (A1c 6.0% / 2017) and patient denies reactive hypoglycemic symptoms, visual blurring, diabetic polys or paresthesias. Last A1c was not at goal:  Lab Results  Component Value Date   HGBA1C 6.1 (H) 07/27/2019       Finally, patient has history of Vitamin D Deficiency ("23" / 2008) and last Vitamin D was at goal:  Lab Results  Component Value Date   VD25OH 80 07/27/2019    Current Outpatient Medications on File Prior to Visit  Medication Sig  . Ascorbic Acid (VITAMIN C PO) Take 1 capsule by mouth daily.   . ASPIRIN PO Take 81 mg by mouth daily.  Marland Kitchen atorvastatin (LIPITOR) 20 MG tablet Take 1 tablet (20 mg total) by mouth at bedtime.  Marland Kitchen azelastine  (OPTIVAR) 0.05 % ophthalmic solution Place 1 drop into both eyes 2 (two) times daily as needed. (Patient taking differently: Place 1 drop into both eyes 2 (two) times daily as needed (allergies). )  . Cholecalciferol (VITAMIN D PO) Take 5,000 Units by mouth daily.   . metoprolol succinate (TOPROL XL) 25 MG 24 hr tablet Take 1 tablet (25 mg total) by mouth daily.  . Omega-3 Fatty Acids (FISH OIL PO) Take 1 capsule by mouth daily.   Marland Kitchen triamcinolone cream (KENALOG) 0.5 % Apply 1 application topically 2 (two) times daily.  . fludrocortisone (FLORINEF) 0.1 MG tablet Take 1 tablet 2 x /day for Low  BP (Patient not taking: Reported on 07/27/2019)   No current facility-administered medications on file prior to visit.   Allergies  Allergen Reactions  . Codeine Nausea And Vomiting    "Feels like I have the flu"   Past Medical History:  Diagnosis Date  . Allergy   . Cancer of right breast ([place]) 1992  . Depression   . Hyperlipidemia   . Hypertension   . Vitamin D deficiency    Health Maintenance  Topic Date Due  . MAMMOGRAM  07/16/2020  . TETANUS/TDAP  12/22/2025  . INFLUENZA VACCINE  Completed  . DEXA SCAN  Completed  . PNA vac Low Risk Adult  Completed   Immunization History  Administered Date(s) Administered  . DT (Pediatric) 12/23/2014  . Influenza,  High Dose Seasonal PF 03/31/2019  . Influenza-Unspecified 04/02/2016, 03/16/2018  . Pneumococcal Conjugate-13 12/17/2013  . Pneumococcal Polysaccharide-23 10/23/2009  . Td 04/15/2005  . Zoster 11/19/2010   Last Colon - 06/05/2014 - Dr Brazil ([facility]) recc 5 yr f/u due Dec 2020  Last MGM -  07/17/2019  Past Surgical History:  Procedure Laterality Date  . ABDOMINAL HYSTERECTOMY     partial  . BREAST LUMPECTOMY Right   . CATARACT EXTRACTION, BILATERAL Bilateral 2016  . TONSILLECTOMY AND ADENOIDECTOMY     Family History  Problem Relation Age of Onset  . Heart attack Mother 84  . Heart attack Father 8  . Leukemia  Brother    Social History   Tobacco Use  . Smoking status: Never Smoker  . Smokeless tobacco: Never Used  Substance Use Topics  . Alcohol use: No  . Drug use: No    ROS Constitutional: Denies fever, chills, weight loss/gain, headaches, insomnia,  night sweats, and change in appetite. Does c/o fatigue. Eyes: Denies redness, blurred vision, diplopia, discharge, itchy, watery eyes.  ENT: Denies discharge, congestion, post nasal drip, epistaxis, sore throat, earache, hearing loss, dental pain, Tinnitus, Vertigo, Sinus pain, snoring.  Cardio: Denies chest pain, palpitations, irregular heartbeat, syncope, dyspnea, diaphoresis, orthopnea, PND, claudication, edema Respiratory: denies cough, dyspnea, DOE, pleurisy, hoarseness, laryngitis, wheezing.  Gastrointestinal: Denies dysphagia, heartburn, reflux, water brash, pain, cramps, nausea, vomiting, bloating, diarrhea, constipation, hematemesis, melena, hematochezia, jaundice, hemorrhoids Genitourinary: Denies dysuria, frequency, urgency, nocturia, hesitancy, discharge, hematuria, flank pain Breast: Breast lumps, nipple discharge, bleeding.  Musculoskeletal: Denies arthralgia, myalgia, stiffness, Jt. Swelling, pain, limp, and strain/sprain. Denies falls. Skin: Denies puritis, rash, hives, warts, acne, eczema, changing in skin lesion Neuro: No weakness, tremor, incoordination, spasms, paresthesia, pain Psychiatric: Denies confusion, memory loss, sensory loss. Denies Depression. Endocrine: Denies change in weight, skin, hair change, nocturia, and paresthesia, diabetic polys, visual blurring, hyper / hypo glycemic episodes.  Heme/Lymph: No excessive bleeding, bruising, enlarged lymph nodes.  Physical Exam  BP 118/70   Pulse 60   Temp (!) 97 F (36.1 C)   Resp 16   Ht 5' 4.5" (1.638 m)   Wt 131 lb 12.8 oz (59.8 kg)   BMI 22.27 kg/m   General Appearance: Well nourished, well groomed and in no apparent distress.  Eyes: PERRLA, EOMs,  conjunctiva no swelling or erythema, normal fundi and vessels. Sinuses: No frontal/maxillary tenderness ENT/Mouth: EACs patent / TMs  nl. Nares clear without erythema, swelling, mucoid exudates. Oral hygiene is good. No erythema, swelling, or exudate. Tongue normal, non-obstructing. Tonsils not swollen or erythematous. Hearing normal.  Neck: Supple, thyroid not palpable. No bruits, nodes or JVD. Respiratory: Respiratory effort normal.  BS equal and clear bilateral without rales, rhonci, wheezing or stridor. Cardio: Heart sounds are normal with regular rate and rhythm and no murmurs, rubs or gallops. Peripheral pulses are normal and equal bilaterally without edema. No aortic or femoral bruits. Chest: symmetric with normal excursions and percussion. Breasts: Symmetric, without lumps, nipple discharge, retractions, or fibrocystic changes.  Abdomen: Flat, soft with bowel sounds active. Nontender, no guarding, rebound, hernias, masses, or organomegaly.  Lymphatics: Non tender without lymphadenopathy.  Genitourinary:  Musculoskeletal: Full ROM all peripheral extremities, joint stability, 5/5 strength, and normal gait. Skin: Warm and dry without rashes, lesions, cyanosis, clubbing or  ecchymosis.  Neuro: Cranial nerves intact, reflexes equal bilaterally. Normal muscle tone, no cerebellar symptoms. Sensation intact.  Pysch: Alert and oriented X 3, normal affect, Insight and Judgment appropriate.   Assessment  and Plan  1. Annual Preventative Screening Examination  2. Labile hypertension  - EKG 12-Lead - Urinalysis, Routine w reflex microscopic - Microalbumin / creatinine urine ratio - CBC with Differential/Platelet - COMPLETE METABOLIC PANEL WITH GFR - Magnesium - TSH  3. Hyperlipidemia, mixed  - EKG 12-Lead - Lipid panel - TSH  4. Abnormal glucose  - EKG 12-Lead - Hemoglobin A1c - Insulin, random  5. Vitamin D deficiency  - VITAMIN D 25 Hydroxy  6. Postural hypotension   7.  Screening for colorectal cancer  - COMPLETE METABOLIC PANEL WITH GFR  8. Screening for ischemic heart disease  - EKG 12-Lead  9. FHx: heart disease  - EKG 12-Lead  10. Medication management  - Urinalysis, Routine w reflex microscopic - Microalbumin / creatinine urine ratio - CBC with Differential/Platelet - COMPLETE METABOLIC PANEL WITH GFR - Magnesium - Lipid panel - TSH - Hemoglobin A1c - Insulin, random - VITAMIN D 25 Hydroxy          Patient was counseled in prudent diet to achieve/maintain BMI less than 25 for weight control, BP monitoring, regular exercise and medications. Discussed med's effects and SE's. Screening labs and tests as requested with regular follow-up as recommended. Over 40 minutes of exam, counseling, chart review and high complex critical decision making was performed.   Kirtland Bouchard, MD

## 2019-07-28 ENCOUNTER — Encounter: Payer: Self-pay | Admitting: Internal Medicine

## 2019-07-30 ENCOUNTER — Other Ambulatory Visit: Payer: Self-pay | Admitting: *Deleted

## 2019-07-30 DIAGNOSIS — I10 Essential (primary) hypertension: Secondary | ICD-10-CM

## 2019-07-30 LAB — CBC WITH DIFFERENTIAL/PLATELET
Absolute Monocytes: 655 cells/uL (ref 200–950)
Basophils Absolute: 38 cells/uL (ref 0–200)
Basophils Relative: 0.6 %
Eosinophils Absolute: 258 {cells}/uL (ref 15–500)
Eosinophils Relative: 4.1 %
HCT: 43.4 % (ref 35.0–45.0)
Hemoglobin: 14.4 g/dL (ref 11.7–15.5)
Lymphs Abs: 2048 cells/uL (ref 850–3900)
MCH: 28.9 pg (ref 27.0–33.0)
MCHC: 33.2 g/dL (ref 32.0–36.0)
MCV: 87.1 fL (ref 80.0–100.0)
MPV: 11.1 fL (ref 7.5–12.5)
Monocytes Relative: 10.4 %
Neutro Abs: 3301 cells/uL (ref 1500–7800)
Neutrophils Relative %: 52.4 %
Platelets: 416 10*3/uL — ABNORMAL HIGH (ref 140–400)
RBC: 4.98 10*6/uL (ref 3.80–5.10)
RDW: 13.3 % (ref 11.0–15.0)
Total Lymphocyte: 32.5 %
WBC: 6.3 10*3/uL (ref 3.8–10.8)

## 2019-07-30 LAB — VITAMIN D 25 HYDROXY (VIT D DEFICIENCY, FRACTURES): Vit D, 25-Hydroxy: 70 ng/mL (ref 30–100)

## 2019-07-30 LAB — COMPLETE METABOLIC PANEL WITH GFR
AG Ratio: 2.2 (calc) (ref 1.0–2.5)
ALT: 22 U/L (ref 6–29)
AST: 26 U/L (ref 10–35)
Albumin: 4.8 g/dL (ref 3.6–5.1)
Alkaline phosphatase (APISO): 86 U/L (ref 37–153)
BUN/Creatinine Ratio: 19 (calc) (ref 6–22)
BUN: 18 mg/dL (ref 7–25)
CO2: 29 mmol/L (ref 20–32)
Calcium: 10 mg/dL (ref 8.6–10.4)
Chloride: 105 mmol/L (ref 98–110)
Creat: 0.96 mg/dL — ABNORMAL HIGH (ref 0.60–0.88)
GFR, Est African American: 65 mL/min/{1.73_m2} (ref >=60)
GFR, Est Non African American: 56 mL/min/{1.73_m2} — ABNORMAL LOW (ref >=60)
Globulin: 2.2 g/dL (calc) (ref 1.9–3.7)
Glucose, Bld: 109 mg/dL — ABNORMAL HIGH (ref 65–99)
Potassium: 4.9 mmol/L (ref 3.5–5.3)
Sodium: 140 mmol/L (ref 135–146)
Total Bilirubin: 0.7 mg/dL (ref 0.2–1.2)
Total Protein: 7 g/dL (ref 6.1–8.1)

## 2019-07-30 LAB — URINALYSIS, ROUTINE W REFLEX MICROSCOPIC
Bilirubin Urine: NEGATIVE
Glucose, UA: NEGATIVE
Hgb urine dipstick: NEGATIVE
Leukocytes,Ua: NEGATIVE
Nitrite: NEGATIVE
Protein, ur: NEGATIVE
Specific Gravity, Urine: 1.023 (ref 1.001–1.03)
pH: 5.5 (ref 5.0–8.0)

## 2019-07-30 LAB — LIPID PANEL
Cholesterol: 136 mg/dL (ref <200)
HDL: 55 mg/dL (ref >=50)
LDL Cholesterol (Calc): 64 mg/dL (calc)
Non-HDL Cholesterol (Calc): 81 mg/dL (calc) (ref <130)
Total CHOL/HDL Ratio: 2.5 (calc) (ref <5.0)
Triglycerides: 83 mg/dL (ref <150)

## 2019-07-30 LAB — TSH: TSH: 2.26 mIU/L (ref 0.40–4.50)

## 2019-07-30 LAB — MICROALBUMIN / CREATININE URINE RATIO
Creatinine, Urine: 189 mg/dL (ref 20–275)
Microalb Creat Ratio: 5 mcg/mg creat (ref <30)
Microalb, Ur: 0.9 mg/dL

## 2019-07-30 LAB — HEMOGLOBIN A1C
Hgb A1c MFr Bld: 6.1 % of total Hgb — ABNORMAL HIGH (ref <5.7)
Mean Plasma Glucose: 128 (calc)
eAG (mmol/L): 7.1 (calc)

## 2019-07-30 LAB — MAGNESIUM: Magnesium: 2.1 mg/dL (ref 1.5–2.5)

## 2019-07-30 LAB — INSULIN, RANDOM: Insulin: 6.9 u[IU]/mL

## 2019-07-30 MED ORDER — EZETIMIBE 10 MG PO TABS: ORAL_TABLET | ORAL | 3 refills | Status: DC

## 2019-07-30 MED ORDER — ATORVASTATIN CALCIUM 20 MG PO TABS: ORAL_TABLET | ORAL | 3 refills | Status: DC

## 2019-07-30 MED ORDER — METOPROLOL SUCCINATE ER 25 MG PO TB24: ORAL_TABLET | ORAL | 3 refills | Status: DC

## 2019-08-30 ENCOUNTER — Ambulatory Visit: Payer: Medicare HMO | Admitting: Allergy & Immunology

## 2019-08-31 ENCOUNTER — Other Ambulatory Visit: Payer: Self-pay

## 2019-08-31 ENCOUNTER — Encounter: Payer: Self-pay | Admitting: Family Medicine

## 2019-08-31 ENCOUNTER — Ambulatory Visit (INDEPENDENT_AMBULATORY_CARE_PROVIDER_SITE_OTHER): Payer: Medicare HMO | Admitting: Family Medicine

## 2019-08-31 VITALS — BP 120/76 | HR 67 | Temp 97.1°F | Resp 16 | Ht 64.0 in | Wt 130.2 lb

## 2019-08-31 DIAGNOSIS — J302 Other seasonal allergic rhinitis: Secondary | ICD-10-CM | POA: Diagnosis not present

## 2019-08-31 DIAGNOSIS — H01139 Eczematous dermatitis of unspecified eye, unspecified eyelid: Secondary | ICD-10-CM | POA: Diagnosis not present

## 2019-08-31 DIAGNOSIS — H1010 Acute atopic conjunctivitis, unspecified eye: Secondary | ICD-10-CM

## 2019-08-31 DIAGNOSIS — J3089 Other allergic rhinitis: Secondary | ICD-10-CM

## 2019-08-31 MED ORDER — EUCRISA 2 % EX OINT: 1.0000 "application " | TOPICAL_OINTMENT | Freq: Two times a day (BID) | CUTANEOUS | 5 refills | Status: DC

## 2019-08-31 MED ORDER — DESONIDE 0.05 % EX OINT: 1.0000 "application " | TOPICAL_OINTMENT | Freq: Two times a day (BID) | CUTANEOUS | 5 refills | Status: DC

## 2019-08-31 MED ORDER — FLUTICASONE PROPIONATE 50 MCG/ACT NA SUSP: 1.0000 | Freq: Every day | NASAL | 5 refills | Status: DC

## 2019-08-31 NOTE — Patient Instructions (Addendum)
Allergic conjunctivitis Continue Zaditor eye drops one drop in each eye twice a day as needed for red, itchy eyes Begin desonide 0.05% twice a day as needed for red, itchy areas around your eyes For less irritated skin begin Eucrisa twice a day as needed  Allergic rhinitis Continue loratadine 10 mg once a day as needed for a stuffy nose or itch. Stop antihistamine with decongestant. Samples of Xyzal 5 mg provided. Remember to rotate to a different antihistamine about every 3 months. Some examples of over the counter antihistamines include Zyrtec (cetirizine), Xyzal (levocetirizine), Allegra (fexofenadine), and Claritin (loratidine).  Begin Flonase 1-2 sprays in each nostril once a day as needed for a stuffy nose Consider saline nasal rinses as needed for nasal symptoms. Use this before any medicated nasal sprays for best result  Allergic dermatitis Consider possible patch testing. These are applied on Monday with return visits on Wednesday and Friday. If your symptoms re-occur, begin a journal of events that occurred for up to 6 hours before your symptoms began including foods and beverages consumed, soaps or perfumes you had contact with, and medications.   Call the clinic if this treatment plan is not working well for you  Follow up in 2 months or sooner if needed.

## 2019-08-31 NOTE — Progress Notes (Signed)
[place]  [address] Dept: [phone]  FOLLOW UP NOTE  Patient ID: Tiffany Vaughn, female    DOB: [birth year]  Age: 79 y.o. MRN: [id number] Date of Office Visit: 08/31/2019  Assessment  Chief Complaint: Allergic Rhinitis  (eyes are itchy, red, get sores on them so much, sometimes stick together)  HPI Tiffany Vaughn is an 79 year old female who presents to the clinic for a follow up visit. Tiffany Vaughn reports Tiffany Vaughn allergic conjunctivitis has not been well controlled with red, itchy and flaky skin surrounding both eyes. Tiffany Vaughn denies red conjunctivae and discharge. Tiffany Vaughn denies new medications, personal products, or glasses. Tiffany Vaughn is currently using Zaditor eye drops three to four times a day and Vaseline around Tiffany Vaughn eyes. Tiffany Vaughn reports allergic rhinitis is well controlled with occasional nasal congestion and clear rhinorrhea for which Tiffany Vaughn takes loratadine decongestant with some relief of symptoms. Tiffany Vaughn current medications are listed in the chart.    Drug Allergies:  Allergies  Allergen Reactions  . Codeine Nausea And Vomiting    "Feels like I have the flu"    Physical Exam: BP 120/76 (BP Location: Left Arm, Patient Position: Sitting, Cuff Size: Normal)   Pulse 67   Temp (!) 97.1 F (36.2 C) (Temporal)   Resp 16   Ht 5\' 4"  (1.626 m)   Wt 130 lb 3.2 oz (59.1 kg)   SpO2 96%   BMI 22.35 kg/m    Physical Exam Vitals reviewed.  Constitutional:      Appearance: Normal appearance.  HENT:     Head: Normocephalic and atraumatic.     Right Ear: Tympanic membrane normal.     Left Ear: Tympanic membrane normal.     Nose:     Comments: Bilateral nares normal. Pharynx normal. Ears normal. Eyes with periorbital erythema noted.     Mouth/Throat:     Pharynx: Oropharynx is clear.  Eyes:     Conjunctiva/sclera: Conjunctivae normal.  Cardiovascular:     Rate and Rhythm: Normal rate and regular rhythm.     Heart sounds: Normal heart sounds. No murmur.  Pulmonary:   Effort: Pulmonary effort is normal.     Breath sounds: Normal breath sounds.     Comments: Lungs clear to auscultation Musculoskeletal:        General: Normal range of motion.     Cervical back: Normal range of motion and neck supple.  Skin:    General: Skin is warm and dry.     Comments: Periorbital redness surrounding bilateral eyes  Neurological:     Mental Status: Tiffany Vaughn is alert and oriented to person, place, and time.  Psychiatric:        Mood and Affect: Mood normal.        Behavior: Behavior normal.        Thought Content: Thought content normal.        Judgment: Judgment normal.     Assessment and Plan: 1. Seasonal and perennial allergic rhinitis   2. Seasonal allergic conjunctivitis   3. Atopic dermatitis of eyelid, unspecified laterality     Meds ordered this encounter  Medications  . desonide (DESOWEN) 0.05 % ointment    Sig: Apply 1 application topically 2 (two) times daily.    Dispense:  60 g    Refill:  5  . Crisaborole (EUCRISA) 2 % OINT    Sig: Apply 1 application topically in the morning and at bedtime.    Dispense:  100 g  Refill:  5    May place in the refrigerator to give a cooling sensation  . fluticasone (FLONASE) 50 MCG/ACT nasal spray    Sig: Place 1-2 sprays into both nostrils daily.    Dispense:  16 g    Refill:  5    Patient Instructions  Allergic conjunctivitis Continue Zaditor eye drops one drop in each eye twice a day as needed for red, itchy eyes Begin desonide 0.05% twice a day as needed for red, itchy areas around your eyes For less irritated skin begin Eucrisa twice a day as needed  Allergic rhinitis Continue loratadine 10 mg once a day as needed for a stuffy nose or itch. Stop antihistamine with decongestant. Samples of Xyzal 5 mg provided. Remember to rotate to a different antihistamine about every 3 months. Some examples of over the counter antihistamines include Zyrtec (cetirizine), Xyzal (levocetirizine), Allegra (fexofenadine),  and Claritin (loratidine).  Begin Flonase 1-2 sprays in each nostril once a day as needed for a stuffy nose Consider saline nasal rinses as needed for nasal symptoms. Use this before any medicated nasal sprays for best result  Allergic dermatitis Consider possible patch testing. These are applied on Monday with return visits on Wednesday and Friday. If your symptoms re-occur, begin a journal of events that occurred for up to 6 hours before your symptoms began including foods and beverages consumed, soaps or perfumes you had contact with, and medications.   Call the clinic if this treatment plan is not working well for you  Follow up in 2 months or sooner if needed.   Return in about 2 months (around 10/31/2019), or if symptoms worsen or fail to improve.    Thank you for the opportunity to care for this patient.  Please do not hesitate to contact me with questions.  Gareth Morgan, FNP [facility]

## 2019-10-29 ENCOUNTER — Other Ambulatory Visit: Payer: Self-pay | Admitting: Internal Medicine

## 2019-10-29 DIAGNOSIS — I10 Essential (primary) hypertension: Secondary | ICD-10-CM

## 2019-10-29 DIAGNOSIS — E782 Mixed hyperlipidemia: Secondary | ICD-10-CM

## 2019-10-29 MED ORDER — ATORVASTATIN CALCIUM 20 MG PO TABS: ORAL_TABLET | ORAL | 0 refills | Status: DC

## 2019-10-29 MED ORDER — METOPROLOL SUCCINATE ER 25 MG PO TB24: ORAL_TABLET | ORAL | 0 refills | Status: DC

## 2019-11-02 ENCOUNTER — Ambulatory Visit: Payer: Medicare HMO | Admitting: Family Medicine

## 2019-11-02 ENCOUNTER — Encounter: Payer: Self-pay | Admitting: Adult Health

## 2019-11-02 DIAGNOSIS — N183 Chronic kidney disease, stage 3 unspecified: Secondary | ICD-10-CM | POA: Insufficient documentation

## 2019-11-02 NOTE — Progress Notes (Signed)
MEDICARE ANNUAL WELLNESS VISIT AND 3 MONTH FOLLOW UP  Assessment:   Encounter for Medicare annual wellness exam  Essential hypertension - at goal, continue medications, DASH diet, exercise and monitor at home. Call if greater than 130/80.    Hyperlipidemia -continue atorvastatin, currently aggressively controlled, low risk history, consider trial off of zetia pending lab results - LDL goal <100 - check lipids, decrease fatty foods, increase activity.   Vitamin D deficiency At goal at recent check;  Defer vitamin D level   Depression, remission In remission off of medications   History of cancer of right breast Continue close follow ups, mammogram annually  Osteopenia Continue high calcium diet, vitamin D supplementation, high impact/weight bearing exercises Repeat dexa 06/2020  Seasonal allergies Continue OTC allergy pills  Other abnormal glucose -     Hemoglobin A1c q9m, defer as was checked last visit, check CMP for serum glucose Discussed general issues about diabetes pathophysiology and management., Educational material distributed., Suggested low cholesterol diet., Encouraged aerobic exercise., Discussed foot care., Reminded to get yearly retinal exam.  BMI 21 Continue to recommend diet heavy in fruits and veggies and low in animal meats, cheeses, and dairy products, appropriate calorie intake Discuss exercise recommendations routinely Continue to monitor weight at each visit  CKD 3a Increase fluids, avoid NSAIDS, monitor sugars, will monitor   Screening colon cancer  Colonoscopy- patient declines a colonoscopy even though the risks and benefits were discussed at length. Colon cancer is 3rd most diagnosed cancer and 2nd leading cause of death in both men and women 85 years of age and older. Patient understands the risk of cancer and death with declining the test however they are willing to do cologuard screening instead. They understand that this is not as sensitive  or specific as a colonoscopy and they are still recommended to get a colonoscopy. The cologuard will be sent out to their house.    Future Appointments  Date Time Provider [place]  02/06/2020  9:30 AM Unk Pinto, MD [facility] None  08/12/2020 10:00 AM Unk Pinto, MD [facility] None    Plan:   During the course of the visit the patient was educated and counseled about appropriate screening and preventive services including:    Pneumococcal vaccine   Influenza vaccine  Td vaccine  Screening electrocardiogram  Screening mammography  Bone densitometry screening  Colorectal cancer screening  Diabetes screening  Glaucoma screening  Nutrition counseling   Advanced directives: given information/requested  Subjective:   Tiffany Vaughn is a 79 y.o. female who presents for Medicare Annual Wellness Visit and follow up for HTN, chol, weight, vitamin D def. In May 2020,  patient was hospitalized to r/o CVA and was diagnosed with TGA and Brain scans were negative.  Was on Evista for history of previous right breast cancer 20 years ago but was recently discontinued. Continues with annual mammograms.   She has hx of depression in remission off of medication for may years.    BMI is Body mass index is 21.42 kg/m., she has been working on diet, is active, walking 1 mile daily. She is cutting carbs Wt Readings from Last 3 Encounters:  11/05/19 124 lb 12.8 oz (56.6 kg)  08/31/19 130 lb 3.2 oz (59.1 kg)  07/27/19 131 lb 12.8 oz (59.8 kg)   Her blood pressure has been controlled at home, today their BP is BP: 110/70 She does workout, she is on toprol for HA. She denies chest pain, shortness of breath, dizziness. Was having  postural hypotension, improved since increasing fluid intake.   She is on cholesterol medication, lipitor 20 mg daily and zetia 10 mg daily and denies myalgias. Her cholesterol is at goal. The cholesterol last visit was:   Lab Results   Component Value Date   CHOL 136 07/27/2019   HDL 55 07/27/2019   LDLCALC 64 07/27/2019   TRIG 83 07/27/2019   CHOLHDL 2.5 07/27/2019    She has been working on diet and exercise for prediabetes, and denies foot ulcerations, increased appetite, nausea, paresthesia of the feet, polydipsia, polyuria, visual disturbances, vomiting and weight loss. Last A1C in the office was:  Lab Results  Component Value Date   HGBA1C 6.1 (H) 07/27/2019   She has CKD 3a monitored at this office. Last GFR:  Lab Results  Component Value Date   GFRNONAA 56 (L) 07/27/2019   Patient is on Vitamin D supplement.   Lab Results  Component Value Date   VD25OH 70 07/27/2019        Medication Review Current Outpatient Medications on File Prior to Visit  Medication Sig Dispense Refill  . Ascorbic Acid (VITAMIN C PO) Take 1 capsule by mouth daily.     Marland Kitchen aspirin 81 MG chewable tablet Chew by mouth.    . ASPIRIN PO Take 81 mg by mouth daily.    Marland Kitchen atorvastatin (LIPITOR) 20 MG tablet Take 1 tablet Daily for Cholesterol. 90 tablet 0  . azelastine (OPTIVAR) 0.05 % ophthalmic solution Place 1 drop into both eyes 2 (two) times daily as needed. (Patient taking differently: Place 1 drop into both eyes 2 (two) times daily as needed (allergies). ) 6 mL 6  . Cholecalciferol (VITAMIN D PO) Take 5,000 Units by mouth daily.     . fluticasone (FLONASE) 50 MCG/ACT nasal spray Place 1-2 sprays into both nostrils daily. 16 g 5  . metoprolol succinate (TOPROL XL) 25 MG 24 hr tablet Take 1 tablet daily for BP 90 tablet 0  . Omega-3 Fatty Acids (FISH OIL PO) Take 1 capsule by mouth daily.      No current facility-administered medications on file prior to visit.    Current Problems (verified) Patient Active Problem List   Diagnosis Date Noted  . CKD (chronic kidney disease) stage 3, GFR 30-59 ml/min 11/02/2019  . Seasonal and perennial allergic rhinitis 08/31/2019  . Seasonal allergic conjunctivitis 08/31/2019  . Atopic  dermatitis of eyelid 08/31/2019  . Other abnormal glucose 01/15/2016  . Osteopenia 12/23/2014  . Medication management 10/24/2013  . Hyperlipidemia   . Hypertension   . Vitamin D deficiency   . Depression, major, in remission ([place])   . Seasonal allergies   . History of right breast cancer     Screening Tests Immunization History  Administered Date(s) Administered  . DT (Pediatric) 12/23/2014  . Influenza, High Dose Seasonal PF 03/31/2019  . Influenza-Unspecified 04/02/2016, 03/16/2018  . PFIZER SARS-COV-2 Vaccination 07/13/2019, 08/08/2019  . Pneumococcal Conjugate-13 12/17/2013  . Pneumococcal Polysaccharide-23 10/23/2009  . Td 04/15/2005  . Zoster 11/19/2010   Preventative care: Last colonoscopy: 05/2014  Dr. Mary Sella, normal, 5 year follow up, hx of polyps but not precancerous, prefers to do cologuard Last mammogram: 06/2019 Last pap smear/pelvic exam: 2009 remote DEXA: 06/2018, t -1.8, osteopenia Cardiolite 2011 negative EF 65%   Prior vaccinations: TD: 2016  Influenza: 03/2019 Pneumococcal: 2011 Prevnar 13: 2015 Shingles/Zostavax: 2012 - ask insurance about shingrix Covid: 2/2, 2021, pfizer  Names of Other Physician/Practitioners you currently use: 1. [facility]- here for primary care 2. Dr. Everlene Farrier, eye doctor, last visit seeing 2020, looking for new provider  3. Dr. Deanna Artis, dentist, last visit q 6 months 2021  Patient Care Team: Unk Pinto, MD as PCP - General (Internal Medicine) Irene Shipper, MD as Consulting Physician (Gastroenterology) Powers, Elyse Jarvis, MD as Referring Physician (Cardiology)   Allergies Allergies  Allergen Reactions  . Codeine Nausea And Vomiting    "Feels like I have the flu"   SURGICAL HISTORY She  has a past surgical history that includes Tonsillectomy and adenoidectomy; Abdominal hysterectomy; Breast lumpectomy (Right); and Cataract extraction, bilateral (Bilateral, 2016). FAMILY  HISTORY Her family history includes Heart attack (age of onset: 11) in her mother; Heart attack (age of onset: 61) in her father; Leukemia in her brother. SOCIAL HISTORY She  reports that she has never smoked. She has never used smokeless tobacco. She reports that she does not drink alcohol or use drugs.  MEDICARE WELLNESS OBJECTIVES: Physical activity: no regular exercise at this time Depression/mood screen:   Depression screen [facility] 2/9 11/05/2019  Decreased Interest 0  Down, Depressed, Hopeless 0  PHQ - 2 Score 0  Altered sleeping 0  Tired, decreased energy 0  Change in appetite 0  Feeling bad or failure about yourself  0  Trouble concentrating 0  Moving slowly or fidgety/restless 0  Suicidal thoughts 0  PHQ-9 Score 0  Difficult doing work/chores Not difficult at all   Hearing: normal Visual acuity: normal,  does perform annual eye exam  ADLs:  In your present state of health, do you have any difficulty performing the following activities: 11/05/2019 01/20/2019  Hearing? N N  Vision? N N  Difficulty concentrating or making decisions? N N  Walking or climbing stairs? N N  Dressing or bathing? N N  Doing errands, shopping? N N  Some recent data might be hidden    Fall risk: Low Risk Cognitive Testing  Alert? Yes  Normal Appearance?Yes  Oriented to person? Yes  Place? Yes   Time? Yes  Recall of three objects?  Yes  Can perform simple calculations? Yes  Displays appropriate judgment?Yes  Can read the correct time from a watch face?Yes  EOL planning: Does Patient Have a Medical Advance Directive?: Yes Type of Advance Directive: Healthcare Power of Attorney, Living will Does patient want to make changes to medical advance directive?: No - Patient declined Copy of [place] in Chart?: No - copy requested    Objective:     Today's Vitals   11/05/19 1359  BP: 110/70  Pulse: (!) 56  Resp: 16  Temp: (!) 97.1 F (36.2 C)  SpO2: 96%  Weight: 124 lb 12.8  oz (56.6 kg)  Height: 5\' 4"  (1.626 m)   Body mass index is 21.42 kg/m.  General Appearance: Well nourished, in no apparent distress. Eyes: PERRLA, EOMs, conjunctiva no swelling or erythema Sinuses: No Frontal/maxillary tenderness ENT/Mouth: Ext aud canals with dry, erythematous flaky skin, TMs without erythema, bulging. No erythema, swelling, or exudate on post pharynx.  Tonsils not swollen or erythematous. Hearing normal.  Neck: Supple, thyroid normal.  Respiratory: Respiratory effort normal, BS equal bilaterally without rales, rhonchi, wheezing or stridor.  Cardio: RRR with no MRGs. Brisk peripheral pulses without edema.  Abdomen: Soft, + BS.  Non tender, no guarding, rebound, hernias, masses. Lymphatics: Non tender without lymphadenopathy.  Musculoskeletal: Full ROM, 5/5 strength, Normal gait Skin: Warm, dry without rashes, lesions, ecchymosis.  Neuro: Cranial nerves  intact. No cerebellar symptoms.  Psych: Awake and oriented X 3, normal affect, Insight and Judgment appropriate.    Medicare Attestation I have personally reviewed: The patient's medical and social history Their use of alcohol, tobacco or illicit drugs Their current medications and supplements The patient's functional ability including ADLs,fall risks, home safety risks, cognitive, and hearing and visual impairment Diet and physical activities Evidence for depression or mood disorders  The patient's weight, height, BMI, and visual acuity have been recorded in the chart.  I have made referrals, counseling, and provided education to the patient based on review of the above and I have provided the patient with a written personalized care plan for preventive services.     Izora Ribas, NP   11/05/2019

## 2019-11-05 ENCOUNTER — Encounter: Payer: Self-pay | Admitting: Adult Health

## 2019-11-05 ENCOUNTER — Other Ambulatory Visit: Payer: Self-pay

## 2019-11-05 ENCOUNTER — Ambulatory Visit (INDEPENDENT_AMBULATORY_CARE_PROVIDER_SITE_OTHER): Payer: Medicare HMO | Admitting: Adult Health

## 2019-11-05 VITALS — BP 110/70 | HR 56 | Temp 97.1°F | Resp 16 | Ht 64.0 in | Wt 124.8 lb

## 2019-11-05 DIAGNOSIS — Z853 Personal history of malignant neoplasm of breast: Secondary | ICD-10-CM

## 2019-11-05 DIAGNOSIS — R7309 Other abnormal glucose: Secondary | ICD-10-CM | POA: Diagnosis not present

## 2019-11-05 DIAGNOSIS — Z0001 Encounter for general adult medical examination with abnormal findings: Secondary | ICD-10-CM

## 2019-11-05 DIAGNOSIS — Z6822 Body mass index (BMI) 22.0-22.9, adult: Secondary | ICD-10-CM

## 2019-11-05 DIAGNOSIS — I10 Essential (primary) hypertension: Secondary | ICD-10-CM

## 2019-11-05 DIAGNOSIS — J302 Other seasonal allergic rhinitis: Secondary | ICD-10-CM | POA: Diagnosis not present

## 2019-11-05 DIAGNOSIS — N1831 Chronic kidney disease, stage 3a: Secondary | ICD-10-CM

## 2019-11-05 DIAGNOSIS — E782 Mixed hyperlipidemia: Secondary | ICD-10-CM

## 2019-11-05 DIAGNOSIS — F325 Major depressive disorder, single episode, in full remission: Secondary | ICD-10-CM

## 2019-11-05 DIAGNOSIS — R6889 Other general symptoms and signs: Secondary | ICD-10-CM

## 2019-11-05 DIAGNOSIS — M8580 Other specified disorders of bone density and structure, unspecified site: Secondary | ICD-10-CM

## 2019-11-05 DIAGNOSIS — E559 Vitamin D deficiency, unspecified: Secondary | ICD-10-CM

## 2019-11-05 DIAGNOSIS — Z1211 Encounter for screening for malignant neoplasm of colon: Secondary | ICD-10-CM

## 2019-11-05 DIAGNOSIS — Z79899 Other long term (current) drug therapy: Secondary | ICD-10-CM

## 2019-11-05 DIAGNOSIS — J3089 Other allergic rhinitis: Secondary | ICD-10-CM | POA: Diagnosis not present

## 2019-11-05 DIAGNOSIS — I951 Orthostatic hypotension: Secondary | ICD-10-CM

## 2019-11-05 DIAGNOSIS — R69 Illness, unspecified: Secondary | ICD-10-CM | POA: Diagnosis not present

## 2019-11-05 DIAGNOSIS — Z0000 Encounter for general adult medical examination without abnormal findings: Secondary | ICD-10-CM

## 2019-11-05 MED ORDER — EZETIMIBE 10 MG PO TABS: ORAL_TABLET | ORAL | 3 refills | Status: DC

## 2019-11-05 NOTE — Patient Instructions (Addendum)
  Ms. Westhoff , Thank you for taking time to come for your Medicare Wellness Visit. I appreciate your ongoing commitment to your health goals. Please review the following plan we discussed and let me know if I can assist you in the future.   These are the goals we discussed: Goals    . Exercise 150 min/wk Moderate Activity    . LDL CALC < 100       This is a list of the screening recommended for you and due dates:  Health Maintenance  Topic Date Due  . Flu Shot  01/27/2020  . Mammogram  07/16/2020  . Tetanus Vaccine  12/22/2025  . DEXA scan (bone density measurement)  Completed  . COVID-19 Vaccine  Completed  . Pneumonia vaccines  Completed    Check with insurance if they cover shingrix vaccine - can get at [facility] or some [facility]     Please call insurance or [employer] to make sure cologuard is covered  COLOGUARD INFORMATION   Cologuard is an easy to use noninvasive colon cancer screening test based on the latest advances in stool DNA science.   Colon cancer is 3rd most diagnosed cancer and 2nd leading cause of death in both men and women 28 years of age and older despite being one of the most preventable and treatable cancers if found early.  4 of out 5 people diagnosed with colon cancer have NO prior family history.  When caught EARLY 90% of colon cancer is curable.   More than 92% of cologuard patients have NO out of pocket cost for screening however only your insurer can confirm how Cologuard would be covered for you. [employer]ologu[employer] has a team of specialist that can help you contact your insurer and ask the right questions. Please call [phone] so they can help.   You will receive a short call from [place] support center at [employer], when you receive a call they will say they are from [place],  to confirm your mailing address and give you more information.  When they calll you, it will appear on the caller ID as "[employer]" or in  some cases only this number will appear, [phone].   [employer] will ship your collection kit directly to you. You will collect a single stool sample in the privacy of your own home, no special preparation required. You will return the kit via [place] pre-paid shipping or pick-up, in the same box it arrived in. Then I will contact you to discuss your results after I receive them from the laboratory.   If you have any questions or concerns, [employer] Customer Support Specialist are available 24 hours a day, 7 days a week at [phone] or go to [email or web address].

## 2019-11-06 ENCOUNTER — Other Ambulatory Visit: Payer: Self-pay | Admitting: Adult Health

## 2019-11-06 LAB — COMPLETE METABOLIC PANEL WITH GFR
AG Ratio: 1.9 (calc) (ref 1.0–2.5)
ALT: 19 U/L (ref 6–29)
AST: 22 U/L (ref 10–35)
Albumin: 4.5 g/dL (ref 3.6–5.1)
Alkaline phosphatase (APISO): 106 U/L (ref 37–153)
BUN/Creatinine Ratio: 19 (calc) (ref 6–22)
BUN: 18 mg/dL (ref 7–25)
CO2: 30 mmol/L (ref 20–32)
Calcium: 9.7 mg/dL (ref 8.6–10.4)
Chloride: 105 mmol/L (ref 98–110)
Creat: 0.96 mg/dL — ABNORMAL HIGH (ref 0.60–0.88)
GFR, Est African American: 65 mL/min/{1.73_m2} (ref >=60)
GFR, Est Non African American: 56 mL/min/{1.73_m2} — ABNORMAL LOW (ref >=60)
Globulin: 2.4 g/dL (calc) (ref 1.9–3.7)
Glucose, Bld: 90 mg/dL (ref 65–99)
Potassium: 4.4 mmol/L (ref 3.5–5.3)
Sodium: 141 mmol/L (ref 135–146)
Total Bilirubin: 0.9 mg/dL (ref 0.2–1.2)
Total Protein: 6.9 g/dL (ref 6.1–8.1)

## 2019-11-06 LAB — CBC WITH DIFFERENTIAL/PLATELET
Absolute Monocytes: 653 cells/uL (ref 200–950)
Basophils Absolute: 19 cells/uL (ref 0–200)
Basophils Relative: 0.3 %
Eosinophils Absolute: 160 cells/uL (ref 15–500)
Eosinophils Relative: 2.5 %
HCT: 42.4 % (ref 35.0–45.0)
Hemoglobin: 13.7 g/dL (ref 11.7–15.5)
Lymphs Abs: 2086 cells/uL (ref 850–3900)
MCH: 28.5 pg (ref 27.0–33.0)
MCHC: 32.3 g/dL (ref 32.0–36.0)
MCV: 88.1 fL (ref 80.0–100.0)
MPV: 11.2 fL (ref 7.5–12.5)
Monocytes Relative: 10.2 %
Neutro Abs: 3482 cells/uL (ref 1500–7800)
Neutrophils Relative %: 54.4 %
Platelets: 377 10*3/uL (ref 140–400)
RBC: 4.81 10*6/uL (ref 3.80–5.10)
RDW: 13.6 % (ref 11.0–15.0)
Total Lymphocyte: 32.6 %
WBC: 6.4 10*3/uL (ref 3.8–10.8)

## 2019-11-06 LAB — MAGNESIUM: Magnesium: 2.2 mg/dL (ref 1.5–2.5)

## 2019-11-06 LAB — LIPID PANEL
Cholesterol: 131 mg/dL (ref <200)
HDL: 52 mg/dL (ref >=50)
LDL Cholesterol (Calc): 64 mg/dL (calc)
Non-HDL Cholesterol (Calc): 79 mg/dL (calc) (ref <130)
Total CHOL/HDL Ratio: 2.5 (calc) (ref <5.0)
Triglycerides: 70 mg/dL (ref <150)

## 2019-11-06 LAB — TSH: TSH: 1.38 mIU/L (ref 0.40–4.50)

## 2019-11-23 DIAGNOSIS — Z1211 Encounter for screening for malignant neoplasm of colon: Secondary | ICD-10-CM | POA: Diagnosis not present

## 2019-11-24 LAB — COLOGUARD: Cologuard: NEGATIVE

## 2019-11-29 ENCOUNTER — Encounter: Payer: Self-pay | Admitting: Internal Medicine

## 2019-11-29 ENCOUNTER — Other Ambulatory Visit: Payer: Self-pay | Admitting: Adult Health

## 2019-11-29 DIAGNOSIS — E782 Mixed hyperlipidemia: Secondary | ICD-10-CM

## 2019-11-29 LAB — COLOGUARD: COLOGUARD: NEGATIVE

## 2019-11-30 DIAGNOSIS — R69 Illness, unspecified: Secondary | ICD-10-CM | POA: Diagnosis not present

## 2020-02-06 ENCOUNTER — Ambulatory Visit: Payer: Medicare HMO | Admitting: Internal Medicine

## 2020-02-17 ENCOUNTER — Encounter: Payer: Self-pay | Admitting: Internal Medicine

## 2020-02-17 NOTE — Progress Notes (Signed)
History of Present Illness:       This very nice 79 y.o.  MWF presents for 6 month follow up with HTN, HLD, Pre-Diabetes and Vitamin D Deficiency.       Patient is treated for HTN (2004)  & BP has been controlled at home.  In Today's BP is at goal -  128/84.  May 2020, patient was hospitalized ruling out CVA with final dx/o TGA.  Negative stress Cardiolites x 2  (2005 & 2011).  Patient has had no complaints of any cardiac type chest pain, palpitations, dyspnea / orthopnea / PND, dizziness, claudication, or dependent edema.      Hyperlipidemia is controlled with diet & Atorvastatin. Patient denies myalgias or other med SE's. Last Lipids were at goal:  Lab Results  Component Value Date   CHOL 131 11/05/2019   HDL 52 11/05/2019   LDLCALC 64 11/05/2019   TRIG 70 11/05/2019   CHOLHDL 2.5 11/05/2019    Also, the patient has history of PreDiabetes (A1c 6.0% / 2017)  and has had no symptoms of reactive hypoglycemia, diabetic polys, paresthesias or visual blurring.  Last A1c was not at goal:  Lab Results  Component Value Date   HGBA1C 6.1 (H) 07/27/2019       Further, the patient also has history of Vitamin D Deficiency ("23" / 2008)  and supplements vitamin D without any suspected side-effects. Last vitamin D was at goal:  Lab Results  Component Value Date   VD25OH 66 07/27/2019    Current Outpatient Medications on File Prior to Visit  Medication Sig  . Ascorbic Acid (VITAMIN C PO) Take 1 capsule by mouth daily.   Marland Kitchen aspirin 81 MG chewable tablet Chew by mouth.  . ASPIRIN PO Take 81 mg by mouth daily.  . Cholecalciferol (VITAMIN D PO) Take 5,000 Units by mouth daily.   . Omega-3 Fatty Acids (FISH OIL PO) Take 1 capsule by mouth daily.    No current facility-administered medications on file prior to visit.    Allergies  Allergen Reactions  . Codeine Nausea And Vomiting    "Feels like I have the flu"    PMHx:   Past Medical History:  Diagnosis Date  . Allergy   .  Cancer of right breast ([place]) 1992  . Depression   . Hyperlipidemia   . Hypertension   . Postural hypotension 11/14/2018  . Transient global amnesia 11/14/2018  . Vitamin D deficiency     Immunization History  Administered Date(s) Administered  . DT (Pediatric) 12/23/2014  . Influenza, High Dose Seasonal PF 03/31/2019  . Influenza-Unspecified 04/02/2016, 03/16/2018  . PFIZER SARS-COV-2 Vaccination 07/13/2019, 08/08/2019  . Pneumococcal Conjugate-13 12/17/2013  . Pneumococcal Polysaccharide-23 10/23/2009  . Td 04/15/2005  . Zoster 11/19/2010    Past Surgical History:  Procedure Laterality Date  . ABDOMINAL HYSTERECTOMY     partial  . BREAST LUMPECTOMY Right   . CATARACT EXTRACTION, BILATERAL Bilateral 2016  . TONSILLECTOMY AND ADENOIDECTOMY      FHx:    Reviewed / unchanged  SHx:    Reviewed / unchanged   Systems Review:  Constitutional: Denies fever, chills, wt changes, headaches, insomnia, fatigue, night sweats, change in appetite. Eyes: Denies redness, blurred vision, diplopia, discharge, itchy, watery eyes.  ENT: Denies discharge, congestion, post nasal drip, epistaxis, sore throat, earache, hearing loss, dental pain, tinnitus, vertigo, sinus pain, snoring.  CV: Denies chest pain, palpitations, irregular heartbeat, syncope, dyspnea, diaphoresis, orthopnea, PND, claudication or  edema. Respiratory: denies cough, dyspnea, DOE, pleurisy, hoarseness, laryngitis, wheezing.  Gastrointestinal: Denies dysphagia, odynophagia, heartburn, reflux, water brash, abdominal pain or cramps, nausea, vomiting, bloating, diarrhea, constipation, hematemesis, melena, hematochezia  or hemorrhoids. Genitourinary: Denies dysuria, frequency, urgency, nocturia, hesitancy, discharge, hematuria or flank pain. Musculoskeletal: Denies arthralgias, myalgias, stiffness, jt. swelling, pain, limping or strain/sprain.  Skin: Denies pruritus, rash, hives, warts, acne, eczema or change in skin  lesion(s). Neuro: No weakness, tremor, incoordination, spasms, paresthesia or pain. Psychiatric: Denies confusion, memory loss or sensory loss. Endo: Denies change in weight, skin or hair change.  Heme/Lymph: No excessive bleeding, bruising or enlarged lymph nodes.  Physical Exam  BP 128/84   Pulse 64   Temp (!) 97.5 F (36.4 C)   Resp 16   Ht 5\' 4"  (1.626 m)   Wt 126 lb 6.4 oz (57.3 kg)   BMI 21.70 kg/m   Appears  well nourished, well groomed  and in no distress.  Eyes: PERRLA, EOMs, conjunctiva no swelling or erythema. Sinuses: No frontal/maxillary tenderness ENT/Mouth: EAC's clear, TM's nl w/o erythema, bulging. Nares clear w/o erythema, swelling, exudates. Oropharynx clear without erythema or exudates. Oral hygiene is good. Tongue normal, non obstructing. Hearing intact.  Neck: Supple. Thyroid not palpable. Car 2+/2+ without bruits, nodes or JVD. Chest: Respirations nl with BS clear & equal w/o rales, rhonchi, wheezing or stridor.  Cor: Heart sounds normal w/ regular rate and rhythm without sig. murmurs, gallops, clicks or rubs. Peripheral pulses normal and equal  without edema.  Abdomen: Soft & bowel sounds normal. Non-tender w/o guarding, rebound, hernias, masses or organomegaly.  Lymphatics: Unremarkable.  Musculoskeletal: Full ROM all peripheral extremities, joint stability, 5/5 strength and normal gait.  Skin: Warm, dry without exposed rashes, lesions or ecchymosis apparent.  Neuro: Cranial nerves intact, reflexes equal bilaterally. Sensory-motor testing grossly intact. Tendon reflexes grossly intact.  Pysch: Alert & oriented x 3.  Insight and judgement nl & appropriate. No ideations.  Assessment and Plan:  1. Essential hypertension  - Continue medication, monitor blood pressure at home.  - Continue DASH diet.  Reminder to go to the ER if any CP,  SOB, nausea, dizziness, severe HA, changes vision/speech.  - CBC with Differential/Platelet - COMPLETE METABOLIC PANEL  WITH GFR - Magnesium - TSH  2. Hyperlipidemia, mixed  - Continue diet/meds, exercise,& lifestyle modifications.  - Continue monitor periodic cholesterol/liver & renal functions   - Lipid panel - TSH  3. Abnormal glucose  - Continue diet, exercise  - Lifestyle modifications.  - Monitor appropriate labs.  - Hemoglobin A1c - Insulin, random  4. Vitamin D deficiency  - Continue supplementation.  - VITAMIN D 25 Hydroxy   5. Medication management  - CBC with Differential/Platelet - COMPLETE METABOLIC PANEL WITH GFR - Magnesium - Lipid panel - TSH - Hemoglobin A1c - Insulin, random - VITAMIN D 25 Hydroxy         Discussed  regular exercise, BP monitoring, weight control to achieve/maintain BMI less than 25 and discussed med and SE's. Recommended labs to assess and monitor clinical status with further disposition pending results of labs.  I discussed the assessment and treatment plan with the patient. The patient was provided an opportunity to ask questions and all were answered. The patient agreed with the plan and demonstrated an understanding of the instructions.  I provided over 30 minutes of exam, counseling, chart review and  complex critical decision making.         The patient was advised to  call back or seek an in-person evaluation if the symptoms worsen or if the condition fails to improve as anticipated.   Kirtland Bouchard, MD

## 2020-02-17 NOTE — Patient Instructions (Signed)

## 2020-02-18 ENCOUNTER — Other Ambulatory Visit: Payer: Self-pay | Admitting: *Deleted

## 2020-02-18 ENCOUNTER — Ambulatory Visit (INDEPENDENT_AMBULATORY_CARE_PROVIDER_SITE_OTHER): Payer: Medicare HMO | Admitting: Internal Medicine

## 2020-02-18 ENCOUNTER — Other Ambulatory Visit: Payer: Self-pay

## 2020-02-18 VITALS — BP 128/84 | HR 64 | Temp 97.5°F | Resp 16 | Ht 64.0 in | Wt 126.4 lb

## 2020-02-18 DIAGNOSIS — Z79899 Other long term (current) drug therapy: Secondary | ICD-10-CM

## 2020-02-18 DIAGNOSIS — E782 Mixed hyperlipidemia: Secondary | ICD-10-CM | POA: Diagnosis not present

## 2020-02-18 DIAGNOSIS — I10 Essential (primary) hypertension: Secondary | ICD-10-CM

## 2020-02-18 DIAGNOSIS — E559 Vitamin D deficiency, unspecified: Secondary | ICD-10-CM | POA: Diagnosis not present

## 2020-02-18 DIAGNOSIS — R7309 Other abnormal glucose: Secondary | ICD-10-CM | POA: Diagnosis not present

## 2020-02-18 MED ORDER — ATORVASTATIN CALCIUM 20 MG PO TABS: ORAL_TABLET | ORAL | 0 refills | Status: DC

## 2020-02-18 MED ORDER — METOPROLOL SUCCINATE ER 25 MG PO TB24: ORAL_TABLET | ORAL | 0 refills | Status: DC

## 2020-02-19 LAB — CBC WITH DIFFERENTIAL/PLATELET
Absolute Monocytes: 592 cells/uL (ref 200–950)
Basophils Absolute: 19 cells/uL (ref 0–200)
Basophils Relative: 0.3 %
Eosinophils Absolute: 170 cells/uL (ref 15–500)
Eosinophils Relative: 2.7 %
HCT: 43.3 % (ref 35.0–45.0)
Hemoglobin: 14.2 g/dL (ref 11.7–15.5)
Lymphs Abs: 1537 cells/uL (ref 850–3900)
MCH: 28.7 pg (ref 27.0–33.0)
MCHC: 32.8 g/dL (ref 32.0–36.0)
MCV: 87.7 fL (ref 80.0–100.0)
MPV: 10.8 fL (ref 7.5–12.5)
Monocytes Relative: 9.4 %
Neutro Abs: 3982 cells/uL (ref 1500–7800)
Neutrophils Relative %: 63.2 %
Platelets: 380 10*3/uL (ref 140–400)
RBC: 4.94 10*6/uL (ref 3.80–5.10)
RDW: 13.4 % (ref 11.0–15.0)
Total Lymphocyte: 24.4 %
WBC: 6.3 10*3/uL (ref 3.8–10.8)

## 2020-02-19 LAB — COMPLETE METABOLIC PANEL WITH GFR
AG Ratio: 2 (calc) (ref 1.0–2.5)
ALT: 17 U/L (ref 6–29)
AST: 20 U/L (ref 10–35)
Albumin: 4.5 g/dL (ref 3.6–5.1)
Alkaline phosphatase (APISO): 98 U/L (ref 37–153)
BUN/Creatinine Ratio: 20 (calc) (ref 6–22)
BUN: 19 mg/dL (ref 7–25)
CO2: 29 mmol/L (ref 20–32)
Calcium: 9.5 mg/dL (ref 8.6–10.4)
Chloride: 105 mmol/L (ref 98–110)
Creat: 0.93 mg/dL — ABNORMAL HIGH (ref 0.60–0.88)
GFR, Est African American: 67 mL/min/{1.73_m2} (ref >=60)
GFR, Est Non African American: 58 mL/min/{1.73_m2} — ABNORMAL LOW (ref >=60)
Globulin: 2.3 g/dL (calc) (ref 1.9–3.7)
Glucose, Bld: 91 mg/dL (ref 65–99)
Potassium: 4.4 mmol/L (ref 3.5–5.3)
Sodium: 140 mmol/L (ref 135–146)
Total Bilirubin: 0.9 mg/dL (ref 0.2–1.2)
Total Protein: 6.8 g/dL (ref 6.1–8.1)

## 2020-02-19 LAB — HEMOGLOBIN A1C
Hgb A1c MFr Bld: 6.1 % of total Hgb — ABNORMAL HIGH (ref <5.7)
Mean Plasma Glucose: 128 (calc)
eAG (mmol/L): 7.1 (calc)

## 2020-02-19 LAB — LIPID PANEL
Cholesterol: 181 mg/dL (ref <200)
HDL: 56 mg/dL (ref >=50)
LDL Cholesterol (Calc): 105 mg/dL (calc) — ABNORMAL HIGH
Non-HDL Cholesterol (Calc): 125 mg/dL (calc) (ref <130)
Total CHOL/HDL Ratio: 3.2 (calc) (ref <5.0)
Triglycerides: 102 mg/dL (ref <150)

## 2020-02-19 LAB — MAGNESIUM: Magnesium: 2.2 mg/dL (ref 1.5–2.5)

## 2020-02-19 LAB — INSULIN, RANDOM: Insulin: 4.5 u[IU]/mL

## 2020-02-19 LAB — VITAMIN D 25 HYDROXY (VIT D DEFICIENCY, FRACTURES): Vit D, 25-Hydroxy: 56 ng/mL (ref 30–100)

## 2020-02-19 LAB — TSH: TSH: 2.09 mIU/L (ref 0.40–4.50)

## 2020-02-19 NOTE — Progress Notes (Signed)
========================================================  -   Chol = 181 -  Excellent   - Very low risk for Heart Attack  / Stroke =========================================================  - But bad LDL Chol = 105  (Ideal or goal for LDL is less than 70)  - So..............................  - Recommend a stricter low cholesterol diet   - Cholesterol only comes from animal sources  - ie. meat, dairy, egg yolks  - Eat all the vegetables you want.  - Avoid meat, especially red meat - Beef AND Pork .  - Avoid cheese & dairy - milk & ice cream.     - Cheese is the most concentrated form of trans-fats which  is the worst thing to clog up our arteries.   - Veggie cheese is OK which can be found in the fresh  produce section at [employer] or [employer] or [employer] =========================================================  - A1c = 6.1% - still too high (Ideal or Goal is less than 5.7%)   - Blood sugar and A1c are elevated in the borderline and  early or pre-diabetes range which has the same   300% increased risk for heart attack, stroke, cancer and   alzheimer- type vascular dementia as full blown diabetes.   But the good news is that diet, exercise with  weight loss can cure the early diabetes at this point. =========================================================  - Vitamin D = 56 - OK =========================================================  - All Else - CBC - Kidneys - Electrolytes - Liver - Magnesium & Thyroid    - all  Normal / OK =========================================================

## 2020-02-20 ENCOUNTER — Other Ambulatory Visit: Payer: Self-pay | Admitting: Internal Medicine

## 2020-02-20 DIAGNOSIS — E782 Mixed hyperlipidemia: Secondary | ICD-10-CM

## 2020-05-05 DIAGNOSIS — R69 Illness, unspecified: Secondary | ICD-10-CM | POA: Diagnosis not present

## 2020-06-13 DIAGNOSIS — Z6822 Body mass index (BMI) 22.0-22.9, adult: Secondary | ICD-10-CM | POA: Insufficient documentation

## 2020-06-13 NOTE — Progress Notes (Signed)
3 MONTH FOLLOW UP  Assessment:     Essential hypertension - at goal, continue medications, DASH diet, exercise and monitor at home. Call if greater than 130/80.    Hyperlipidemia -continue atorvastatin, titrate for goal  - LDL goal <100 - check lipids, decrease fatty foods, increase activity.   Vitamin D deficiency At goal at recent check;  Defer vitamin D level   Depression, remission In remission off of medications   History of cancer of right breast Continue close follow ups, mammogram annually  Osteopenia Continue high calcium diet, vitamin D supplementation, high impact/weight bearing exercises Repeat dexa 06/2020 - order entered to [facility]  Seasonal allergies Continue OTC allergy pills  Other abnormal glucose -     Hemoglobin A1c q64m, defer as was checked last visit, check CMP for serum glucose Discussed general issues about diabetes pathophysiology and management., Educational material distributed., Suggested low cholesterol diet., Encouraged aerobic exercise., Discussed foot care., Reminded to get yearly retinal exam.  BMI 22 Continue to recommend diet heavy in fruits and veggies and low in animal meats, cheeses, and dairy products, appropriate calorie intake Discuss exercise recommendations routinely Continue to monitor weight at each visit  CKD 3a Increase fluids, avoid NSAIDS, monitor sugars, will monitor    Future Appointments  Date Time Provider [place]  10/02/2020  2:00 PM Unk Pinto, MD [facility] None  01/16/2021  9:30 AM Liane Comber, NP [facility] None     Subjective:   Tiffany Vaughn is a 79 y.o. female who presents for HTN, chol, weight, vitamin D def. In May 2020,  patient was hospitalized to r/o CVA and was diagnosed with TGA and Brain scans were negative.  Was on Evista for history of previous right breast cancer 20 years ago but was discontinued. Continues with annual mammograms.   She has hx of depression in remission  for may years, not on any medications.    BMI is Body mass index is 22.31 kg/m., she has been working on diet, is active, was walking 1 mile daily but got out of the habit after holiday, plans to restart. She is cutting carbs.  Wt Readings from Last 3 Encounters:  06/16/20 130 lb (59 kg)  02/18/20 126 lb 6.4 oz (57.3 kg)  11/05/19 124 lb 12.8 oz (56.6 kg)   Her blood pressure has been controlled at home, today their BP is BP: 106/68 She does workout, she is on toprol for HA. She denies chest pain, shortness of breath, dizziness. Was having postural hypotension, improved since increasing fluid intake.   She is on cholesterol medication, lipitor 20 mg daily and denies myalgias. Her cholesterol is not at goal. The cholesterol last visit was:   Lab Results  Component Value Date   CHOL 181 02/18/2020   HDL 56 02/18/2020   LDLCALC 105 (H) 02/18/2020   TRIG 102 02/18/2020   CHOLHDL 3.2 02/18/2020    She has been working on diet and exercise for prediabetes, and denies foot ulcerations, increased appetite, nausea, paresthesia of the feet, polydipsia, polyuria, visual disturbances, vomiting and weight loss. Last A1C in the office was:  Lab Results  Component Value Date   HGBA1C 6.1 (H) 02/18/2020   She has CKD 3a monitored at this office. Last GFR:  Lab Results  Component Value Date   GFRNONAA 58 (L) 02/18/2020   Patient is on Vitamin D supplement.   Lab Results  Component Value Date   VD25OH 56 02/18/2020        Medication  Review Current Outpatient Medications on File Prior to Visit  Medication Sig Dispense Refill  . Ascorbic Acid (VITAMIN C PO) Take 1 capsule by mouth daily.     . Ascorbic Acid (VITAMIN C) 500 MG CHEW Chew by mouth daily.    Marland Kitchen aspirin 81 MG chewable tablet Chew by mouth.    . ASPIRIN PO Take 81 mg by mouth daily.    Marland Kitchen atorvastatin (LIPITOR) 20 MG tablet TAKE 1 TABLET BY MOUTH EVERY DAY FOR CHOLESTEROL 90 tablet 1  . Cholecalciferol (VITAMIN D PO) Take 5,000  Units by mouth daily.     . metoprolol succinate (TOPROL XL) 25 MG 24 hr tablet Take 1 tablet daily for BP 90 tablet 0  . Omega-3 Fatty Acids (FISH OIL PO) Take 1 capsule by mouth daily.     . Zinc 50 MG CAPS Take by mouth.     No current facility-administered medications on file prior to visit.    Current Problems (verified) Patient Active Problem List   Diagnosis Date Noted  . BMI 21.0-21.9, adult 06/13/2020  . CKD (chronic kidney disease) stage 3, GFR 30-59 ml/min (HCC) 11/02/2019  . Seasonal and perennial allergic rhinitis 08/31/2019  . Seasonal allergic conjunctivitis 08/31/2019  . Atopic dermatitis of eyelid 08/31/2019  . Other abnormal glucose 01/15/2016  . Osteopenia 12/23/2014  . Medication management 10/24/2013  . Hyperlipidemia   . Hypertension   . Vitamin D deficiency   . Depression, major, in remission ([place])   . Seasonal allergies   . History of right breast cancer     Allergies Allergies  Allergen Reactions  . Codeine Nausea And Vomiting    "Feels like I have the flu"   SURGICAL HISTORY She  has a past surgical history that includes Tonsillectomy and adenoidectomy; Abdominal hysterectomy; Breast lumpectomy (Right); and Cataract extraction, bilateral (Bilateral, 2016). FAMILY HISTORY Her family history includes Heart attack (age of onset: 22) in her mother; Heart attack (age of onset: 55) in her father; Leukemia in her brother. SOCIAL HISTORY She  reports that she has never smoked. She has never used smokeless tobacco. She reports that she does not drink alcohol and does not use drugs.  Review of Systems  Constitutional: Negative for malaise/fatigue and weight loss.  HENT: Negative for hearing loss and tinnitus.   Eyes: Negative for blurred vision and double vision.  Respiratory: Negative for cough, shortness of breath and wheezing.   Cardiovascular: Negative for chest pain, palpitations, orthopnea, claudication and leg swelling.  Gastrointestinal: Negative  for abdominal pain, blood in stool, constipation, diarrhea, heartburn, melena, nausea and vomiting.  Genitourinary: Negative.   Musculoskeletal: Negative for joint pain and myalgias.  Skin: Negative for rash.  Neurological: Negative for dizziness, tingling, sensory change, weakness and headaches.  Endo/Heme/Allergies: Negative for polydipsia.  Psychiatric/Behavioral: Negative.   All other systems reviewed and are negative.    Objective:     Today's Vitals   06/16/20 0930  BP: 106/68  Pulse: 76  Temp: (!) 97.2 F (36.2 C)  SpO2: 94%  Weight: 130 lb (59 kg)  Height: 5\' 4"  (1.626 m)   Body mass index is 22.31 kg/m.  General Appearance: Well nourished, in no apparent distress. Eyes: PERRLA, EOMs, conjunctiva no swelling or erythema Sinuses: No Frontal/maxillary tenderness ENT/Mouth: Ext aud canals with dry, TMs without erythema, bulging. No erythema, swelling, or exudate on post pharynx.  Tonsils not swollen or erythematous. Hearing normal.  Neck: Supple, thyroid normal.  Respiratory: Respiratory effort normal, BS equal bilaterally  without rales, rhonchi, wheezing or stridor.  Cardio: RRR with no MRGs. Brisk peripheral pulses without edema.  Abdomen: Soft, + BS.  Non tender, no guarding, rebound, hernias, masses. Lymphatics: Non tender without lymphadenopathy.  Musculoskeletal: Full ROM, 5/5 strength, Normal gait Skin: Warm, dry without rashes, lesions, ecchymosis.  Neuro: Cranial nerves intact. No cerebellar symptoms.  Psych: Awake and oriented X 3, normal affect, Insight and Judgment appropriate.     Izora Ribas, NP   06/16/2020

## 2020-06-16 ENCOUNTER — Other Ambulatory Visit: Payer: Self-pay

## 2020-06-16 ENCOUNTER — Encounter: Payer: Self-pay | Admitting: Adult Health

## 2020-06-16 ENCOUNTER — Ambulatory Visit (INDEPENDENT_AMBULATORY_CARE_PROVIDER_SITE_OTHER): Payer: Medicare HMO | Admitting: Adult Health

## 2020-06-16 VITALS — BP 106/68 | HR 76 | Temp 97.2°F | Ht 64.0 in | Wt 130.0 lb

## 2020-06-16 DIAGNOSIS — Z79899 Other long term (current) drug therapy: Secondary | ICD-10-CM

## 2020-06-16 DIAGNOSIS — M8580 Other specified disorders of bone density and structure, unspecified site: Secondary | ICD-10-CM

## 2020-06-16 DIAGNOSIS — I10 Essential (primary) hypertension: Secondary | ICD-10-CM

## 2020-06-16 DIAGNOSIS — N1831 Chronic kidney disease, stage 3a: Secondary | ICD-10-CM

## 2020-06-16 DIAGNOSIS — Z6822 Body mass index (BMI) 22.0-22.9, adult: Secondary | ICD-10-CM | POA: Diagnosis not present

## 2020-06-16 DIAGNOSIS — E782 Mixed hyperlipidemia: Secondary | ICD-10-CM

## 2020-06-16 DIAGNOSIS — E559 Vitamin D deficiency, unspecified: Secondary | ICD-10-CM | POA: Diagnosis not present

## 2020-06-16 DIAGNOSIS — R7309 Other abnormal glucose: Secondary | ICD-10-CM | POA: Diagnosis not present

## 2020-06-16 NOTE — Patient Instructions (Addendum)
Goals    . Exercise 150 min/wk Moderate Activity    . HEMOGLOBIN A1C < 5.7    . LDL CALC < 100        Avoid processed carbohydrate  Restart walking - best after meals - helps with insulin sensitivity to bring down blood sugar  Try increasing water intake to 65+ oz daily or as close to this as possible    High-Fiber Diet Fiber, also called dietary fiber, is a type of carbohydrate that is found in fruits, vegetables, whole grains, and beans. A high-fiber diet can have many health benefits. Your health care provider may recommend a high-fiber diet to help:  Prevent constipation. Fiber can make your bowel movements more regular.  Lower your cholesterol.  Relieve the following conditions: ? Swelling of veins in the anus (hemorrhoids). ? Swelling and irritation (inflammation) of specific areas of the digestive tract (uncomplicated diverticulosis). ? A problem of the large intestine (colon) that sometimes causes pain and diarrhea (irritable bowel syndrome, IBS).  Prevent overeating as part of a weight-loss plan.  Prevent heart disease, type 2 diabetes, and certain cancers. What is my plan? The recommended daily fiber intake in grams (g) includes:  38 g for men age 58 or younger.  30 g for men over age 31.  2 g for women age 50 or younger.  21 g for women over age 70. You can get the recommended daily intake of dietary fiber by:  Eating a variety of fruits, vegetables, grains, and beans.  Taking a fiber supplement, if it is not possible to get enough fiber through your diet. What do I need to know about a high-fiber diet?  It is better to get fiber through food sources rather than from fiber supplements. There is not a lot of research about how effective supplements are.  Always check the fiber content on the nutrition facts label of any prepackaged food. Look for foods that contain 5 g of fiber or more per serving.  Talk with a diet and nutrition specialist (dietitian)  if you have questions about specific foods that are recommended or not recommended for your medical condition, especially if those foods are not listed below.  Gradually increase how much fiber you consume. If you increase your intake of dietary fiber too quickly, you may have bloating, cramping, or gas.  Drink plenty of water. Water helps you to digest fiber. What are tips for following this plan?  Eat a wide variety of high-fiber foods.  Make sure that half of the grains that you eat each day are whole grains.  Eat breads and cereals that are made with whole-grain flour instead of refined flour or white flour.  Eat brown rice, bulgur wheat, or millet instead of white rice.  Start the day with a breakfast that is high in fiber, such as a cereal that contains 5 g of fiber or more per serving.  Use beans in place of meat in soups, salads, and pasta dishes.  Eat high-fiber snacks, such as berries, raw vegetables, nuts, and popcorn.  Choose whole fruits and vegetables instead of processed forms like juice or sauce. What foods can I eat?  Fruits Berries. Pears. Apples. Oranges. Avocado. Prunes and raisins. Dried figs. Vegetables Sweet potatoes. Spinach. Kale. Artichokes. Cabbage. Broccoli. Cauliflower. Green peas. Carrots. Squash. Grains Whole-grain breads. Multigrain cereal. Oats and oatmeal. Brown rice. Barley. Bulgur wheat. [place]. Quinoa. Bran muffins. Popcorn. Rye wafer crackers. Meats and other proteins Navy, kidney, and pinto beans. Soybeans.  Split peas. Lentils. Nuts and seeds. Dairy Fiber-fortified yogurt. Beverages Fiber-fortified soy milk. Fiber-fortified orange juice. Other foods Fiber bars. The items listed above may not be a complete list of recommended foods and beverages. Contact a dietitian for more options. What foods are not recommended? Fruits Fruit juice. Cooked, strained fruit. Vegetables Fried potatoes. Canned vegetables. Well-cooked  vegetables. Grains White bread. Pasta made with refined flour. White rice. Meats and other proteins Fatty cuts of meat. Fried chicken or fried fish. Dairy Milk. Yogurt. Cream cheese. Sour cream. Fats and oils Butters. Beverages Soft drinks. Other foods Cakes and pastries. The items listed above may not be a complete list of foods and beverages to avoid. Contact a dietitian for more information. Summary  Fiber is a type of carbohydrate. It is found in fruits, vegetables, whole grains, and beans.  There are many health benefits of eating a high-fiber diet, such as preventing constipation, lowering blood cholesterol, helping with weight loss, and reducing your risk of heart disease, diabetes, and certain cancers.  Gradually increase your intake of fiber. Increasing too fast can result in cramping, bloating, and gas. Drink plenty of water while you increase your fiber.  The best sources of fiber include whole fruits and vegetables, whole grains, nuts, seeds, and beans. This information is not intended to replace advice given to you by your health care provider. Make sure you discuss any questions you have with your health care provider. Document Revised: 04/18/2017 Document Reviewed: 04/18/2017 Elsevier Patient Education  2020 [employer].

## 2020-06-17 LAB — COMPLETE METABOLIC PANEL WITH GFR
AG Ratio: 1.8 (calc) (ref 1.0–2.5)
ALT: 16 U/L (ref 6–29)
AST: 22 U/L (ref 10–35)
Albumin: 4.6 g/dL (ref 3.6–5.1)
Alkaline phosphatase (APISO): 114 U/L (ref 37–153)
BUN/Creatinine Ratio: 26 (calc) — ABNORMAL HIGH (ref 6–22)
BUN: 24 mg/dL (ref 7–25)
CO2: 28 mmol/L (ref 20–32)
Calcium: 10.1 mg/dL (ref 8.6–10.4)
Chloride: 104 mmol/L (ref 98–110)
Creat: 0.91 mg/dL — ABNORMAL HIGH (ref 0.60–0.88)
GFR, Est African American: 69 mL/min/{1.73_m2} (ref >=60)
GFR, Est Non African American: 60 mL/min/{1.73_m2} (ref >=60)
Globulin: 2.6 g/dL (calc) (ref 1.9–3.7)
Glucose, Bld: 104 mg/dL — ABNORMAL HIGH (ref 65–99)
Potassium: 4.8 mmol/L (ref 3.5–5.3)
Sodium: 140 mmol/L (ref 135–146)
Total Bilirubin: 0.8 mg/dL (ref 0.2–1.2)
Total Protein: 7.2 g/dL (ref 6.1–8.1)

## 2020-06-17 LAB — LIPID PANEL
Cholesterol: 195 mg/dL (ref <200)
HDL: 56 mg/dL (ref >=50)
LDL Cholesterol (Calc): 117 mg/dL (calc) — ABNORMAL HIGH
Non-HDL Cholesterol (Calc): 139 mg/dL (calc) — ABNORMAL HIGH (ref <130)
Total CHOL/HDL Ratio: 3.5 (calc) (ref <5.0)
Triglycerides: 114 mg/dL (ref <150)

## 2020-06-17 LAB — CBC WITH DIFFERENTIAL/PLATELET
Absolute Monocytes: 665 cells/uL (ref 200–950)
Basophils Absolute: 42 cells/uL (ref 0–200)
Basophils Relative: 0.6 %
Eosinophils Absolute: 238 cells/uL (ref 15–500)
Eosinophils Relative: 3.4 %
HCT: 44.6 % (ref 35.0–45.0)
Hemoglobin: 14.8 g/dL (ref 11.7–15.5)
Lymphs Abs: 2002 cells/uL (ref 850–3900)
MCH: 29.2 pg (ref 27.0–33.0)
MCHC: 33.2 g/dL (ref 32.0–36.0)
MCV: 88.1 fL (ref 80.0–100.0)
MPV: 11.1 fL (ref 7.5–12.5)
Monocytes Relative: 9.5 %
Neutro Abs: 4053 cells/uL (ref 1500–7800)
Neutrophils Relative %: 57.9 %
Platelets: 436 10*3/uL — ABNORMAL HIGH (ref 140–400)
RBC: 5.06 10*6/uL (ref 3.80–5.10)
RDW: 13.5 % (ref 11.0–15.0)
Total Lymphocyte: 28.6 %
WBC: 7 10*3/uL (ref 3.8–10.8)

## 2020-06-17 LAB — HEMOGLOBIN A1C
Hgb A1c MFr Bld: 6.1 % of total Hgb — ABNORMAL HIGH (ref <5.7)
Mean Plasma Glucose: 128 mg/dL
eAG (mmol/L): 7.1 mmol/L

## 2020-06-17 LAB — MAGNESIUM: Magnesium: 2.2 mg/dL (ref 1.5–2.5)

## 2020-06-17 LAB — TSH: TSH: 1.16 mIU/L (ref 0.40–4.50)

## 2020-06-18 DIAGNOSIS — R69 Illness, unspecified: Secondary | ICD-10-CM | POA: Diagnosis not present

## 2020-07-18 ENCOUNTER — Other Ambulatory Visit: Payer: Self-pay | Admitting: Internal Medicine

## 2020-07-18 DIAGNOSIS — I10 Essential (primary) hypertension: Secondary | ICD-10-CM

## 2020-07-22 DIAGNOSIS — Z1231 Encounter for screening mammogram for malignant neoplasm of breast: Secondary | ICD-10-CM | POA: Diagnosis not present

## 2020-07-22 LAB — HM MAMMOGRAPHY

## 2020-08-12 ENCOUNTER — Encounter: Payer: Medicare Other | Admitting: Internal Medicine

## 2020-08-13 ENCOUNTER — Encounter: Payer: Self-pay | Admitting: Internal Medicine

## 2020-08-19 NOTE — Progress Notes (Signed)
Assessment and Plan:  Tiffany Vaughn was seen today for dizziness.  Diagnoses and all orders for this visit:  Dizziness Episodic, not elicited in office today  Reports typically with sitting to standing - BP in office is on low side, but neg orthostatics Low for age BP; consider reducing toprol 25 mg - consider reducing to 12.5 mg or holding entirely Did recently recover from covid 19 - possible mild residual related sx; if labs negative try short steroid taper However with strong family hx of MI, no recent cardiac eval, new exertional dyspena - check CXR then plan to refer on to cardiology for further evaluation Go to the ER if any chest pain, shortness of breath, nausea, dizziness with syncope, severe HA, changes vision/speech -     EKG 12-Lead -     CBC with Differential/Platelet -     COMPLETE METABOLIC PANEL WITH GFR -     Urinalysis, Routine w reflex microscopic -     Orthostatic vital signs  Exertional dyspnea ? Residual from recent covid 19, check CXR, EKG normal, also with dizziness, will refer to cardiology due to family history unless otherwise explained -     DG Chest 2 View; Future  Further disposition pending results of labs. Discussed med's effects and SE's.   Over 30 minutes of exam, counseling, chart review, and critical decision making was performed.   Future Appointments  Date Time Provider [place]  10/02/2020  2:00 PM Unk Pinto, MD [facility] None  01/16/2021  9:30 AM Liane Comber, NP [facility] None    ------------------------------------------------------------------------------------------------------------------   HPI BP 102/62   Pulse 60   Temp (!) 96.1 F (35.6 C)   Wt 132 lb 9.6 oz (60.1 kg)   SpO2 96%   BMI 22.76 kg/m   79 y.o.female with hx of allergies, htn, hyperlipidemia, postural hypotension presents for evaluation of dizziness. Accompanied by her husband today. She reports had very mild case of covid 32 early Jan. Has had some  brain fog since then but was generally feeling well. She is accompanied by her husband today.   She reports about 3 weeks ago abruptly started having intermittent episodes of dizziness with room spinning with position changes, typically sitting to standing. Denies with neck rotation, rolling over in bed. Denies congestion. Does note R ear fullness. Denies allergies or URI sx.   She has also noted increased dyspnea with exertion, going up stairs will cause dizziness and dyspnea that is atypical for her. Will rest and resolves quickly.   She has hx of postural hypotension, is on toprol 25 mg daily for BP for many years. Doesn't routinely monitor BPs but checked twice, "was normal." Denies palpitations, skipping beats, chest pain, fatigue, edema. EKG 1229/2020 showed left anterior hemi block with mild bradycardia. She does notably have MI hx in both parents, MI in mother age 24 and 61 in father. She reports did have benign stress test but over 10 years ago.   Today their BP is BP: 102/62  BMI is Body mass index is 22.76 kg/m., Has been pushing fluid intakes, 4-5 glasses, increased from baseline  Wt Readings from Last 3 Encounters:  08/20/20 132 lb 9.6 oz (60.1 kg)  06/16/20 130 lb (59 kg)  02/18/20 126 lb 6.4 oz (57.3 kg)     Lab Results  Component Value Date   GFRNONAA 60 06/16/2020   GFRNONAA 58 (L) 02/18/2020   GFRNONAA 56 (L) 11/05/2019     CTA head/neck 11/14/2018 showed 1. Negative for  large vessel occlusion. 2. Mild to moderate irregularity and mild stenosis of the right PCA P1 and P2 segments. But otherwise minimal for age atherosclerosis in the head and neck, and no other stenosis. There is mild-to-moderate arterial tortuosity.   Family History:  Herfamily history includes Heart attack (age of onset: 36) in her mother; Heart attack (age of onset: 73) in her father; Leukemia in her brother. Social History:   reports that she has never smoked. She has never used smokeless  tobacco. She reports that she does not drink alcohol and does not use drugs.   Past Medical History:  Diagnosis Date  . Allergy   . Cancer of right breast ([place]) 1992  . Depression   . Hyperlipidemia   . Hypertension   . Postural hypotension 11/14/2018  . Transient global amnesia 11/14/2018  . Vitamin D deficiency      Allergies  Allergen Reactions  . Codeine Nausea And Vomiting    "Feels like I have the flu"    Current Outpatient Medications on File Prior to Visit  Medication Sig  . Ascorbic Acid (VITAMIN C PO) Take 1 capsule by mouth daily.   Marland Kitchen aspirin 81 MG chewable tablet Chew by mouth.  . ASPIRIN PO Take 81 mg by mouth daily.  Marland Kitchen atorvastatin (LIPITOR) 20 MG tablet TAKE 1 TABLET BY MOUTH EVERY DAY FOR CHOLESTEROL  . Cholecalciferol (VITAMIN D PO) Take 5,000 Units by mouth daily.   . metoprolol succinate (TOPROL-XL) 25 MG 24 hr tablet Take   1 tablet   Daily    for BP  . Omega-3 Fatty Acids (FISH OIL PO) Take 1 capsule by mouth daily.   . Probiotic Product (PROBIOTIC PO) Take by mouth daily.  . Zinc 50 MG CAPS Take by mouth.  . Ascorbic Acid (VITAMIN C) 500 MG CHEW Chew by mouth daily.   No current facility-administered medications on file prior to visit.     ROS: Review of Systems  Constitutional: Negative for chills, diaphoresis, fever, malaise/fatigue and weight loss.  HENT: Negative for congestion, ear discharge, ear pain, hearing loss, sinus pain, sore throat and tinnitus.        R ear fullness  Eyes: Negative for blurred vision and double vision.       Intermittent difficulty focusing  Respiratory: Positive for shortness of breath (exertional with climbing stairs). Negative for cough, hemoptysis, sputum production and wheezing.   Cardiovascular: Negative for chest pain, palpitations, orthopnea, claudication, leg swelling and PND.  Gastrointestinal: Positive for constipation (chronic, consistent with baseline). Negative for abdominal pain, blood in stool, diarrhea,  heartburn, melena, nausea and vomiting.  Genitourinary: Negative.   Musculoskeletal: Negative.  Negative for falls.  Neurological: Positive for dizziness. Negative for tingling, sensory change, speech change, focal weakness, loss of consciousness, weakness and headaches.  Endo/Heme/Allergies: Negative for environmental allergies.  Psychiatric/Behavioral: Negative for memory loss.     Physical Exam:  BP 102/62   Pulse 60   Temp (!) 96.1 F (35.6 C)   Wt 132 lb 9.6 oz (60.1 kg)   SpO2 96%   BMI 22.76 kg/m   General Appearance: Well nourished, in no apparent distress. Eyes: PERRLA, EOMs, conjunctiva no swelling or erythema Sinuses: No Frontal/maxillary tenderness ENT/Mouth: Ext aud canals clear, TMs without erythema, bulging. No erythema, swelling, or exudate on post pharynx.  Tonsils not swollen or erythematous. Hearing normal.  Neck: Supple, thyroid normal. ROM does not ellicit dizziness.  Respiratory: Respiratory effort normal, BS equal bilaterally without rales, rhonchi, wheezing  or stridor.  Cardio: Slow steady rate with no MRGs. Brisk peripheral pulses without edema.  Abdomen: Soft, + BS.  Non tender, no guarding, rebound, hernias, masses. Lymphatics: Non tender without lymphadenopathy.  Musculoskeletal: Full ROM, 5/5 strength, normal gait.  Skin: Warm, dry without rashes, lesions, ecchymosis.  Neuro: Cranial nerves intact. Normal muscle tone, no cerebellar symptoms. Neg pronator drift. Rapid alternating movements, heel to shin. Sensation intact.  Psych: Awake and oriented X 3, normal affect, Insight and Judgment appropriate.     Izora Ribas, NP 11:04 AM [name]

## 2020-08-20 ENCOUNTER — Other Ambulatory Visit: Payer: Self-pay

## 2020-08-20 ENCOUNTER — Ambulatory Visit (INDEPENDENT_AMBULATORY_CARE_PROVIDER_SITE_OTHER): Payer: Medicare HMO | Admitting: Adult Health

## 2020-08-20 ENCOUNTER — Encounter: Payer: Self-pay | Admitting: Adult Health

## 2020-08-20 VITALS — BP 102/62 | HR 60 | Temp 96.1°F | Wt 132.6 lb

## 2020-08-20 DIAGNOSIS — R42 Dizziness and giddiness: Secondary | ICD-10-CM | POA: Diagnosis not present

## 2020-08-20 DIAGNOSIS — R0600 Dyspnea, unspecified: Secondary | ICD-10-CM | POA: Diagnosis not present

## 2020-08-20 DIAGNOSIS — R0609 Other forms of dyspnea: Secondary | ICD-10-CM

## 2020-08-21 ENCOUNTER — Other Ambulatory Visit: Payer: Self-pay | Admitting: Adult Health

## 2020-08-21 LAB — CBC WITH DIFFERENTIAL/PLATELET
Absolute Monocytes: 819 cells/uL (ref 200–950)
Basophils Absolute: 36 cells/uL (ref 0–200)
Basophils Relative: 0.4 %
Eosinophils Absolute: 173 cells/uL (ref 15–500)
Eosinophils Relative: 1.9 %
HCT: 45.9 % — ABNORMAL HIGH (ref 35.0–45.0)
Hemoglobin: 15.3 g/dL (ref 11.7–15.5)
Lymphs Abs: 2257 cells/uL (ref 850–3900)
MCH: 29 pg (ref 27.0–33.0)
MCHC: 33.3 g/dL (ref 32.0–36.0)
MCV: 87.1 fL (ref 80.0–100.0)
MPV: 10.7 fL (ref 7.5–12.5)
Monocytes Relative: 9 %
Neutro Abs: 5815 cells/uL (ref 1500–7800)
Neutrophils Relative %: 63.9 %
Platelets: 455 10*3/uL — ABNORMAL HIGH (ref 140–400)
RBC: 5.27 10*6/uL — ABNORMAL HIGH (ref 3.80–5.10)
RDW: 13.7 % (ref 11.0–15.0)
Total Lymphocyte: 24.8 %
WBC: 9.1 10*3/uL (ref 3.8–10.8)

## 2020-08-21 LAB — COMPLETE METABOLIC PANEL WITH GFR
AG Ratio: 1.9 (calc) (ref 1.0–2.5)
ALT: 21 U/L (ref 6–29)
AST: 19 U/L (ref 10–35)
Albumin: 4.8 g/dL (ref 3.6–5.1)
Alkaline phosphatase (APISO): 111 U/L (ref 37–153)
BUN/Creatinine Ratio: 22 (calc) (ref 6–22)
BUN: 22 mg/dL (ref 7–25)
CO2: 27 mmol/L (ref 20–32)
Calcium: 10.2 mg/dL (ref 8.6–10.4)
Chloride: 102 mmol/L (ref 98–110)
Creat: 1 mg/dL — ABNORMAL HIGH (ref 0.60–0.88)
GFR, Est African American: 61 mL/min/{1.73_m2} (ref >=60)
GFR, Est Non African American: 53 mL/min/{1.73_m2} — ABNORMAL LOW (ref >=60)
Globulin: 2.5 g/dL (calc) (ref 1.9–3.7)
Glucose, Bld: 93 mg/dL (ref 65–99)
Potassium: 4.9 mmol/L (ref 3.5–5.3)
Sodium: 138 mmol/L (ref 135–146)
Total Bilirubin: 0.7 mg/dL (ref 0.2–1.2)
Total Protein: 7.3 g/dL (ref 6.1–8.1)

## 2020-08-21 LAB — URINALYSIS, ROUTINE W REFLEX MICROSCOPIC
Bacteria, UA: NONE SEEN /HPF
Bilirubin Urine: NEGATIVE
Glucose, UA: NEGATIVE
Hgb urine dipstick: NEGATIVE
Hyaline Cast: NONE SEEN /LPF
Ketones, ur: NEGATIVE
Nitrite: NEGATIVE
Protein, ur: NEGATIVE
RBC / HPF: NONE SEEN /HPF (ref 0–2)
Specific Gravity, Urine: 1.022 (ref 1.001–1.03)
Squamous Epithelial / HPF: NONE SEEN /HPF (ref <=5)
WBC, UA: NONE SEEN /HPF (ref 0–5)
pH: 6 (ref 5.0–8.0)

## 2020-08-21 MED ORDER — PREDNISONE 20 MG PO TABS: ORAL_TABLET | ORAL | 0 refills | Status: DC

## 2020-08-27 ENCOUNTER — Other Ambulatory Visit: Payer: Self-pay

## 2020-08-27 ENCOUNTER — Ambulatory Visit
Admission: RE | Admit: 2020-08-27 | Discharge: 2020-08-27 | Disposition: A | Discharge status: Home / Self Care | Payer: Medicare HMO | Source: Ambulatory Visit | Attending: Adult Health | Admitting: Adult Health

## 2020-08-27 DIAGNOSIS — R0609 Other forms of dyspnea: Secondary | ICD-10-CM | POA: Diagnosis not present

## 2020-08-27 DIAGNOSIS — R0600 Dyspnea, unspecified: Secondary | ICD-10-CM

## 2020-08-27 DIAGNOSIS — I700 Atherosclerosis of aorta: Secondary | ICD-10-CM | POA: Diagnosis not present

## 2020-08-28 ENCOUNTER — Other Ambulatory Visit: Payer: Self-pay | Admitting: Adult Health

## 2020-08-28 ENCOUNTER — Encounter: Payer: Self-pay | Admitting: Adult Health

## 2020-08-28 DIAGNOSIS — R0609 Other forms of dyspnea: Secondary | ICD-10-CM

## 2020-08-28 DIAGNOSIS — Z8249 Family history of ischemic heart disease and other diseases of the circulatory system: Secondary | ICD-10-CM

## 2020-08-28 DIAGNOSIS — R0600 Dyspnea, unspecified: Secondary | ICD-10-CM

## 2020-08-28 DIAGNOSIS — I700 Atherosclerosis of aorta: Secondary | ICD-10-CM | POA: Insufficient documentation

## 2020-09-02 ENCOUNTER — Telehealth: Payer: Self-pay | Admitting: Adult Health

## 2020-09-02 NOTE — Telephone Encounter (Signed)
Patient called to request deactivation of pending My Chart due to setup  issues. Requested most recent labs, referral to cardiology information. Mailed labs,AVS, and referral information. deactivated Pending My Chart:; patient states she does not understand how to set up My Chart, may have spouse to try at another time.

## 2020-10-01 ENCOUNTER — Encounter: Payer: Self-pay | Admitting: Internal Medicine

## 2020-10-01 MED ORDER — ATORVASTATIN CALCIUM 20 MG PO TABS: ORAL_TABLET | ORAL | 1 refills | Status: DC

## 2020-10-01 NOTE — Progress Notes (Signed)
Annual Screening/Preventative Visit & Comprehensive Evaluation &  Examination    Future Appointments  Date Time Provider [place]  10/02/2020  2:00 PM Unk Pinto, MD [facility] None  01/16/2021  9:30 AM Liane Comber, NP [facility] None  10/05/2021  2:00 PM Unk Pinto, MD [facility] None       This very nice 80 y.o. MWF presents for a Screening /Preventative Visit & comprehensive evaluation and management of multiple medical co-morbidities.  Patient has been followed for HTN, HLD, Prediabetes  and Vitamin D Deficiency.        HTN predates since 2004.  Patient has CKD 3a attributed to her HTN. Patient was evaluated recently for c/o "dizziness" & dyspnea and was referred for Cardiology consult.  Patient had Negative stress Cardiolite in 2005 & 2011. Patient's BP  On low dose Metoprolol has been controlled at home and patient denies any cardiac symptoms as chest pain, palpitations, shortness of breath, dizziness or ankle swelling. Today's BP is at goal - 128/64 . In May 2020,  patient was hospitalized to r/o CVA and was dx'd withTGA and Brain scans were negative.      Patient's hyperlipidemia is not controlled with diet and Atorvastatin. Patient denies myalgias or other medication SE's. Last lipids were not at goal:  Lab Results  Component Value Date   CHOL 195 06/16/2020   HDL 56 06/16/2020   LDLCALC 117 (H) 06/16/2020   TRIG 114 06/16/2020   CHOLHDL 3.5 06/16/2020        Patient has hx/o prediabetes (A1c 6.0% /2017) and patient denies reactive hypoglycemic symptoms, visual blurring, diabetic polys or paresthesias. Last A1c was not at goal:  Lab Results  Component Value Date   HGBA1C 6.1 (H) 06/16/2020        Finally, patient has history of Vitamin D Deficiency("23" /2008) and last Vitamin D was near goal:  Lab Results  Component Value Date   VD25OH 56 02/18/2020    Current Outpatient Medications on File Prior to Visit  Medication Sig  . VITAMIN C   Take 1 capsule daily.   . ASPIRIN  81 mg Take  daily.  Marland Kitchen VITAMIN D   5,000 Units  Take  daily.   . metoprolol succinate -XL 25 MG  Take   1 tablet   Daily  . Omega-3 FISH OIL Take 1 capsule  daily.   Marland Kitchen PROBIOTIC  Take daily.  . Zinc 50 MG CAPS Take  daily.     Allergies  Allergen Reactions  . Codeine Nausea And Vomiting    "Feels like I have the flu"    Past Medical History:  Diagnosis Date  . Allergy   . Cancer of right breast ([place]) 1992  . Depression   . Hyperlipidemia   . Hypertension   . Postural hypotension 11/14/2018  . Transient global amnesia 11/14/2018  . Vitamin D deficiency     Health Maintenance  Topic Date Due  . INFLUENZA VACCINE  01/26/2021  . MAMMOGRAM  07/22/2021  . TETANUS/TDAP  12/22/2025  . DEXA SCAN  Completed  . COVID-19 Vaccine  Completed  . PNA vac Low Risk Adult  Completed  . HPV VACCINES  Aged Out    Immunization History  Administered Date(s) Administered  . DT (Pediatric) 12/23/2014  . Influenza, High Dose Seasonal PF 03/31/2019  . Influenza-Unspecified 04/02/2016, 03/16/2018, 04/27/2020  . PFIZER(Purple Top)SARS-COV-2 Vaccination 07/13/2019, 08/08/2019, 04/17/2020  . Pneumococcal Conjugate-13 12/17/2013  . Pneumococcal Polysaccharide-23 10/23/2009  . Td 04/15/2005  .  Zoster 11/19/2010    Last Colon - 06/05/2014 - Dr Brazil ([place]) recc 5 yr f/u due Dec 2020  Last MGM - 07/22/2020  Past Surgical History:  Procedure Laterality Date  . ABDOMINAL HYSTERECTOMY     partial  . BREAST LUMPECTOMY Right   . CATARACT EXTRACTION, BILATERAL Bilateral 2016  . TONSILLECTOMY AND ADENOIDECTOMY      Family History  Problem Relation Age of Onset  . Heart attack Mother 79  . Heart attack Father 15  . Leukemia Brother     Social History   Tobacco Use  . Smoking status: Never Smoker  . Smokeless tobacco: Never Used  Vaping Use  . Vaping Use: Never used  Substance Use Topics  . Alcohol use: No  . Drug use: No     ROS Constitutional: Denies fever, chills, weight loss/gain, headaches, insomnia,  night sweats, and change in appetite. Does c/o fatigue. Eyes: Denies redness, blurred vision, diplopia, discharge, itchy, watery eyes.  ENT: Denies discharge, congestion, post nasal drip, epistaxis, sore throat, earache, hearing loss, dental pain, Tinnitus, Vertigo, Sinus pain, snoring.  Cardio: Denies chest pain, palpitations, irregular heartbeat, syncope, dyspnea, diaphoresis, orthopnea, PND, claudication, edema Respiratory: denies cough, dyspnea, DOE, pleurisy, hoarseness, laryngitis, wheezing.  Gastrointestinal: Denies dysphagia, heartburn, reflux, water brash, pain, cramps, nausea, vomiting, bloating, diarrhea, constipation, hematemesis, melena, hematochezia, jaundice, hemorrhoids Genitourinary: Denies dysuria, frequency, urgency, nocturia, hesitancy, discharge, hematuria, flank pain Breast: Breast lumps, nipple discharge, bleeding.  Musculoskeletal: Denies arthralgia, myalgia, stiffness, Jt. Swelling, pain, limp, and strain/sprain. Denies falls. Skin: Denies puritis, rash, hives, warts, acne, eczema, changing in skin lesion Neuro: No weakness, tremor, incoordination, spasms, paresthesia, pain Psychiatric: Denies confusion, memory loss, sensory loss. Denies Depression. Endocrine: Denies change in weight, skin, hair change, nocturia, and paresthesia, diabetic polys, visual blurring, hyper / hypo glycemic episodes.  Heme/Lymph: No excessive bleeding, bruising, enlarged lymph nodes.  Physical Exam  BP 128/64   Pulse 80   Temp (!) 97.5 F (36.4 C) (Temporal)   Resp 16   Ht 5\' 4"  (1.626 m)   Wt 131 lb 6.4 oz (59.6 kg)   SpO2 97%   BMI 22.55 kg/m   General Appearance: Well nourished, well groomed and in no apparent distress.  Eyes: PERRLA, EOMs, conjunctiva no swelling or erythema, normal fundi and vessels. Sinuses: No frontal/maxillary tenderness ENT/Mouth: EACs patent / TMs  nl. Nares clear without  erythema, swelling, mucoid exudates. Oral hygiene is good. No erythema, swelling, or exudate. Tongue normal, non-obstructing. Tonsils not swollen or erythematous. Hearing normal.  Neck: Supple, thyroid not palpable. No bruits, nodes or JVD. Respiratory: Respiratory effort normal.  BS equal and clear bilateral without rales, rhonci, wheezing or stridor. Cardio: Heart sounds are normal with regular rate and rhythm and no murmurs, rubs or gallops. Peripheral pulses are normal and equal bilaterally without edema. No aortic or femoral bruits. Chest: symmetric with normal excursions and percussion. Breasts: Patient deferred to recent MGM. Abdomen: Flat, soft with bowel sounds active. Nontender, no guarding, rebound, hernias, masses, or organomegaly.  Lymphatics: Non tender without lymphadenopathy.  Musculoskeletal: Full ROM all peripheral extremities, joint stability, 5/5 strength, and normal gait. Skin: Warm and dry without rashes, lesions, cyanosis, clubbing or  ecchymosis.  Neuro: Cranial nerves intact, reflexes equal bilaterally. Normal muscle tone, no cerebellar symptoms. Sensation intact.  Pysch: Alert and oriented X 3, normal affect, Insight and Judgment appropriate.   Assessment and Plan  1. Annual Preventative Screening Examination   2. Essential hypertension  - EKG 12-Lead -  Urinalysis, Routine w reflex microscopic - Microalbumin / creatinine urine ratio - CBC with Differential/Platelet - COMPLETE METABOLIC PANEL WITH GFR - Magnesium - TSH  3. Hyperlipidemia, mixed  - atorvastatin 20 MG tablet; TAKE 1 TABLET EVERY DAY   Disp: 90 tab; Rf: 1 - EKG 12-Lead - Lipid panel - TSH  4. Abnormal glucose  - EKG 12-Lead - Hemoglobin A1c - Insulin, random  5. Vitamin D deficiency  - VITAMIN D 25 Hydroxy   6. Stage 3a chronic kidney disease (HCC)  - PTH, intact and calcium  7. Screening for colorectal cancer  - POC Hemoccult Bld  8. Screening for ischemic heart disease  -  EKG 12-Lead  9. FHx: heart disease  - EKG 12-Lead  10. Medication management  - Urinalysis, Routine w reflex microscopic - Microalbumin / creatinine urine ratio - CBC with Differential/Platelet - COMPLETE METABOLIC PANEL WITH GFR - Magnesium - Lipid panel - TSH - Hemoglobin A1c - Insulin, random - VITAMIN D 25 Hydroxy   11. Aortic atherosclerosis ([place])   12. Depression, major, in remission ([place])           Patient was counseled in prudent diet to achieve/maintain BMI less than 25 for weight control, BP monitoring, regular exercise and medications. Discussed med's effects and SE's. Screening labs and tests as requested with regular follow-up as recommended. Over 40 minutes of exam, counseling, chart review and high complex critical decision making was performed.   Kirtland Bouchard, MD

## 2020-10-01 NOTE — Patient Instructions (Signed)

## 2020-10-02 ENCOUNTER — Ambulatory Visit (INDEPENDENT_AMBULATORY_CARE_PROVIDER_SITE_OTHER): Payer: Medicare HMO | Admitting: Internal Medicine

## 2020-10-02 ENCOUNTER — Other Ambulatory Visit: Payer: Self-pay

## 2020-10-02 VITALS — BP 128/64 | HR 80 | Temp 97.5°F | Resp 16 | Ht 64.0 in | Wt 131.4 lb

## 2020-10-02 DIAGNOSIS — Z1212 Encounter for screening for malignant neoplasm of rectum: Secondary | ICD-10-CM

## 2020-10-02 DIAGNOSIS — I700 Atherosclerosis of aorta: Secondary | ICD-10-CM

## 2020-10-02 DIAGNOSIS — E559 Vitamin D deficiency, unspecified: Secondary | ICD-10-CM | POA: Diagnosis not present

## 2020-10-02 DIAGNOSIS — R7309 Other abnormal glucose: Secondary | ICD-10-CM | POA: Diagnosis not present

## 2020-10-02 DIAGNOSIS — I10 Essential (primary) hypertension: Secondary | ICD-10-CM

## 2020-10-02 DIAGNOSIS — Z0000 Encounter for general adult medical examination without abnormal findings: Secondary | ICD-10-CM

## 2020-10-02 DIAGNOSIS — N1831 Chronic kidney disease, stage 3a: Secondary | ICD-10-CM

## 2020-10-02 DIAGNOSIS — Z136 Encounter for screening for cardiovascular disorders: Secondary | ICD-10-CM

## 2020-10-02 DIAGNOSIS — Z1211 Encounter for screening for malignant neoplasm of colon: Secondary | ICD-10-CM

## 2020-10-02 DIAGNOSIS — Z8249 Family history of ischemic heart disease and other diseases of the circulatory system: Secondary | ICD-10-CM

## 2020-10-02 DIAGNOSIS — E782 Mixed hyperlipidemia: Secondary | ICD-10-CM

## 2020-10-02 DIAGNOSIS — F325 Major depressive disorder, single episode, in full remission: Secondary | ICD-10-CM

## 2020-10-02 DIAGNOSIS — Z79899 Other long term (current) drug therapy: Secondary | ICD-10-CM | POA: Diagnosis not present

## 2020-10-02 DIAGNOSIS — Z0001 Encounter for general adult medical examination with abnormal findings: Secondary | ICD-10-CM

## 2020-10-03 LAB — INSULIN, RANDOM: Insulin: 6.2 u[IU]/mL

## 2020-10-03 LAB — URINALYSIS, ROUTINE W REFLEX MICROSCOPIC
Bacteria, UA: NONE SEEN /HPF
Bilirubin Urine: NEGATIVE
Glucose, UA: NEGATIVE
Hgb urine dipstick: NEGATIVE
Hyaline Cast: NONE SEEN /LPF
Nitrite: NEGATIVE
Protein, ur: NEGATIVE
RBC / HPF: NONE SEEN /HPF (ref 0–2)
Specific Gravity, Urine: 1.024 (ref 1.001–1.03)
Squamous Epithelial / HPF: NONE SEEN /HPF (ref <=5)
WBC, UA: NONE SEEN /HPF (ref 0–5)
pH: 5.5 (ref 5.0–8.0)

## 2020-10-03 LAB — PTH, INTACT AND CALCIUM
Calcium: 9.7 mg/dL (ref 8.6–10.4)
PTH: 48 pg/mL (ref 16–77)

## 2020-10-03 LAB — CBC WITH DIFFERENTIAL/PLATELET
Absolute Monocytes: 644 cells/uL (ref 200–950)
Basophils Absolute: 30 cells/uL (ref 0–200)
Basophils Relative: 0.4 %
Eosinophils Absolute: 148 cells/uL (ref 15–500)
Eosinophils Relative: 2 %
HCT: 44.1 % (ref 35.0–45.0)
Hemoglobin: 14.4 g/dL (ref 11.7–15.5)
Lymphs Abs: 1983 cells/uL (ref 850–3900)
MCH: 28.6 pg (ref 27.0–33.0)
MCHC: 32.7 g/dL (ref 32.0–36.0)
MCV: 87.7 fL (ref 80.0–100.0)
MPV: 11 fL (ref 7.5–12.5)
Monocytes Relative: 8.7 %
Neutro Abs: 4595 cells/uL (ref 1500–7800)
Neutrophils Relative %: 62.1 %
Platelets: 448 10*3/uL — ABNORMAL HIGH (ref 140–400)
RBC: 5.03 10*6/uL (ref 3.80–5.10)
RDW: 14.1 % (ref 11.0–15.0)
Total Lymphocyte: 26.8 %
WBC: 7.4 10*3/uL (ref 3.8–10.8)

## 2020-10-03 LAB — MICROALBUMIN / CREATININE URINE RATIO
Creatinine, Urine: 207 mg/dL (ref 20–275)
Microalb Creat Ratio: 3 mcg/mg creat (ref <30)
Microalb, Ur: 0.7 mg/dL

## 2020-10-03 LAB — COMPLETE METABOLIC PANEL WITH GFR
AG Ratio: 1.8 (calc) (ref 1.0–2.5)
ALT: 18 U/L (ref 6–29)
AST: 21 U/L (ref 10–35)
Albumin: 4.5 g/dL (ref 3.6–5.1)
Alkaline phosphatase (APISO): 88 U/L (ref 37–153)
BUN/Creatinine Ratio: 18 (calc) (ref 6–22)
BUN: 18 mg/dL (ref 7–25)
CO2: 23 mmol/L (ref 20–32)
Calcium: 9.7 mg/dL (ref 8.6–10.4)
Chloride: 106 mmol/L (ref 98–110)
Creat: 1.02 mg/dL — ABNORMAL HIGH (ref 0.60–0.88)
GFR, Est African American: 60 mL/min/{1.73_m2} (ref >=60)
GFR, Est Non African American: 52 mL/min/{1.73_m2} — ABNORMAL LOW (ref >=60)
Globulin: 2.5 g/dL (calc) (ref 1.9–3.7)
Glucose, Bld: 93 mg/dL (ref 65–99)
Potassium: 4.6 mmol/L (ref 3.5–5.3)
Sodium: 140 mmol/L (ref 135–146)
Total Bilirubin: 0.7 mg/dL (ref 0.2–1.2)
Total Protein: 7 g/dL (ref 6.1–8.1)

## 2020-10-03 LAB — LIPID PANEL
Cholesterol: 205 mg/dL — ABNORMAL HIGH (ref <200)
HDL: 49 mg/dL — ABNORMAL LOW (ref >=50)
LDL Cholesterol (Calc): 130 mg/dL (calc) — ABNORMAL HIGH
Non-HDL Cholesterol (Calc): 156 mg/dL (calc) — ABNORMAL HIGH (ref <130)
Total CHOL/HDL Ratio: 4.2 (calc) (ref <5.0)
Triglycerides: 144 mg/dL (ref <150)

## 2020-10-03 LAB — MAGNESIUM: Magnesium: 2.2 mg/dL (ref 1.5–2.5)

## 2020-10-03 LAB — VITAMIN D 25 HYDROXY (VIT D DEFICIENCY, FRACTURES): Vit D, 25-Hydroxy: 79 ng/mL (ref 30–100)

## 2020-10-03 LAB — TSH: TSH: 1.67 mIU/L (ref 0.40–4.50)

## 2020-10-03 LAB — HEMOGLOBIN A1C
Hgb A1c MFr Bld: 6.1 % of total Hgb — ABNORMAL HIGH (ref <5.7)
Mean Plasma Glucose: 128 mg/dL
eAG (mmol/L): 7.1 mmol/L

## 2020-10-03 NOTE — Progress Notes (Signed)
============================================================ ============================================================ -   PTH - is a hormone that regulates calcium balance & is Normal ============================================================ ============================================================ - Kidney Functions still Stage 3a & Stable ============================================================ ============================================================ - Total Chol = 205 - Elevated    (Ideal or Goal is less than 180) - and  - LDL Chol = 130 - very elevated    (Ideal or Goal is less than 70) ============================================================ ============================================================ - A1c = 6.1% elevated blood sugar in the early Diabetic range   (Ideal or goal is less than 5.8%)  ============================================================ ============================================================ - Vitamin D = 79 - Excellent  ============================================================ ============================================================ - All Else - CBC - Kidneys - Electrolytes - Liver - Magnesium & Thyroid  - all Normal / OK ============================================================ ============================================================

## 2020-10-03 NOTE — Progress Notes (Signed)
============================================================ ============================================================  -    PTH - is a hormone that regulates calcium balance & is Normal ============================================================ ============================================================  - Kidney Functions still Stage 3a & Stable ============================================================ ============================================================  - Total Chol = 205 - Elevated         (Ideal or Goal is less than 180)  - and   - LDL Chol = 130 - very elevated        (Ideal or Goal is less than 70) ============================================================ ============================================================  - A1c = 6.1% elevated blood sugar in the early Diabetic range       (Ideal or goal is less than 5.8%)  ============================================================ ============================================================  - Vitamin D = 79 - Excellent  ============================================================ ============================================================  - All Else - CBC - Kidneys - Electrolytes - Liver - Magnesium & Thyroid    - all  Normal / OK ============================================================ ============================================================

## 2020-10-20 ENCOUNTER — Other Ambulatory Visit: Payer: Self-pay | Admitting: Adult Health

## 2020-10-20 DIAGNOSIS — E782 Mixed hyperlipidemia: Secondary | ICD-10-CM

## 2020-10-21 ENCOUNTER — Other Ambulatory Visit: Payer: Self-pay | Admitting: Internal Medicine

## 2020-10-21 DIAGNOSIS — I10 Essential (primary) hypertension: Secondary | ICD-10-CM

## 2020-10-24 ENCOUNTER — Encounter: Payer: Self-pay | Admitting: Internal Medicine

## 2020-11-04 ENCOUNTER — Ambulatory Visit: Payer: Medicare HMO | Admitting: Internal Medicine

## 2020-11-06 ENCOUNTER — Ambulatory Visit: Payer: Medicare HMO | Admitting: Adult Health

## 2021-01-16 ENCOUNTER — Ambulatory Visit: Payer: Medicare HMO | Admitting: Adult Health

## 2021-01-29 ENCOUNTER — Other Ambulatory Visit: Payer: Self-pay

## 2021-01-29 ENCOUNTER — Encounter: Payer: Self-pay | Admitting: Adult Health

## 2021-01-29 ENCOUNTER — Ambulatory Visit (INDEPENDENT_AMBULATORY_CARE_PROVIDER_SITE_OTHER): Payer: Medicare HMO | Admitting: Adult Health

## 2021-01-29 VITALS — BP 104/64 | HR 67 | Temp 97.2°F | Wt 129.0 lb

## 2021-01-29 DIAGNOSIS — E782 Mixed hyperlipidemia: Secondary | ICD-10-CM | POA: Diagnosis not present

## 2021-01-29 DIAGNOSIS — Z853 Personal history of malignant neoplasm of breast: Secondary | ICD-10-CM | POA: Diagnosis not present

## 2021-01-29 DIAGNOSIS — I700 Atherosclerosis of aorta: Secondary | ICD-10-CM

## 2021-01-29 DIAGNOSIS — N1831 Chronic kidney disease, stage 3a: Secondary | ICD-10-CM | POA: Diagnosis not present

## 2021-01-29 DIAGNOSIS — Z6822 Body mass index (BMI) 22.0-22.9, adult: Secondary | ICD-10-CM | POA: Diagnosis not present

## 2021-01-29 DIAGNOSIS — R69 Illness, unspecified: Secondary | ICD-10-CM | POA: Diagnosis not present

## 2021-01-29 DIAGNOSIS — Z79899 Other long term (current) drug therapy: Secondary | ICD-10-CM

## 2021-01-29 DIAGNOSIS — F325 Major depressive disorder, single episode, in full remission: Secondary | ICD-10-CM

## 2021-01-29 DIAGNOSIS — Z0001 Encounter for general adult medical examination with abnormal findings: Secondary | ICD-10-CM | POA: Diagnosis not present

## 2021-01-29 DIAGNOSIS — M8580 Other specified disorders of bone density and structure, unspecified site: Secondary | ICD-10-CM

## 2021-01-29 DIAGNOSIS — J302 Other seasonal allergic rhinitis: Secondary | ICD-10-CM

## 2021-01-29 DIAGNOSIS — J3089 Other allergic rhinitis: Secondary | ICD-10-CM | POA: Diagnosis not present

## 2021-01-29 DIAGNOSIS — I10 Essential (primary) hypertension: Secondary | ICD-10-CM

## 2021-01-29 DIAGNOSIS — Z0000 Encounter for general adult medical examination without abnormal findings: Secondary | ICD-10-CM

## 2021-01-29 DIAGNOSIS — E559 Vitamin D deficiency, unspecified: Secondary | ICD-10-CM

## 2021-01-29 DIAGNOSIS — R6889 Other general symptoms and signs: Secondary | ICD-10-CM

## 2021-01-29 MED ORDER — ATORVASTATIN CALCIUM 40 MG PO TABS: ORAL_TABLET | ORAL | 1 refills | Status: DC

## 2021-01-29 NOTE — Progress Notes (Signed)
MEDICARE ANNUAL WELLNESS VISIT AND 3 MONTH FOLLOW UP  Assessment:   Annual Medicare Wellness Visit Due annually  Health maintenance reviewed Schedule DEXA with next mammogram -   Essential hypertension - at goal, continue medications, DASH diet, exercise and monitor at home. Call if greater than 130/80.    Hyperlipidemia -above goal, increase atorvastatin to 40 mg daily  - LDL goal <100 - check lipids, decrease fatty foods, increase activity.   Vitamin D deficiency At goal at recent check;  Defer vitamin D level   Depression, remission ([place]) In remission off of medications   History of cancer of right breast Continue close follow ups, mammogram annually  Osteopenia Continue high calcium diet, vitamin D supplementation, high impact/weight bearing exercises Repeat dexa with next mammogram  Seasonal allergies Continue OTC allergy pills  Other abnormal glucose -     Hemoglobin A1c q75m defer as was checked last visit, check CMP for serum glucose Discussed general issues about diabetes pathophysiology and management., Educational material distributed., Suggested low cholesterol diet., Encouraged aerobic exercise., Discussed foot care., Reminded to get yearly retinal exam.  BMI 22 Continue to recommend diet heavy in fruits and veggies and low in animal meats, cheeses, and dairy products, appropriate calorie intake Discuss exercise recommendations routinely Continue to monitor weight at each visit  CKD 3a ([place] Increase fluids, avoid NSAIDS, monitor sugars, will monitor    Future Appointments  Date Time Provider [place] 10/05/2021  2:00 PM MUnk Pinto MD [facility] None    Plan:   During the course of the visit the patient was educated and counseled about appropriate screening and preventive services including:   Pneumococcal vaccine  Influenza vaccine Td vaccine Screening electrocardiogram Screening mammography Bone densitometry  screening Colorectal cancer screening Diabetes screening Glaucoma screening Nutrition counseling  Advanced directives: given information/requested  Subjective:   Tiffany TUROis a 80y.o. female who presents for Medicare Annual Wellness Visit and follow up for HTN, chol, weight, vitamin D def. In May 2020,  patient was hospitalized to r/o CVA and was diagnosed with TGA and Brain scans were negative.  Was on Evista for history of previous right breast cancer 30 years ago but was recently discontinued. Continues with annual mammograms.   She has hx of depression in remission off of medication for may years.    BMI is Body mass index is 22.14 kg/m., she has been working on diet, is active, walking 1 mile daily. She is cutting carbs Wt Readings from Last 3 Encounters:  01/29/21 129 lb (58.5 kg)  10/02/20 131 lb 6.4 oz (59.6 kg)  08/20/20 132 lb 9.6 oz (60.1 kg)   Her blood pressure has been controlled at home, today their BP is BP: 104/64 She does workout, she is on toprol for HA. She denies chest pain, shortness of breath, dizziness (was having positional, improved with increased fluid).   She is on cholesterol medication, lipitor 20 mg daily Her cholesterol is at goal. The cholesterol last visit was:   Lab Results  Component Value Date   CHOL 205 (H) 10/02/2020   HDL 49 (L) 10/02/2020   LDLCALC 130 (H) 10/02/2020   TRIG 144 10/02/2020   CHOLHDL 4.2 10/02/2020    She has been working on diet and exercise for prediabetes, and denies foot ulcerations, increased appetite, nausea, paresthesia of the feet, polydipsia, polyuria, visual disturbances, vomiting and weight loss. Last A1C in the office was:  Lab Results  Component Value Date   HGBA1C 6.1 (  H) 10/02/2020   She has CKD 3a monitored at this office. Last GFR:  Lab Results  Component Value Date   GFRNONAA 52 (L) 10/02/2020   Patient is on Vitamin D supplement.   Lab Results  Component Value Date   VD25OH 79 10/02/2020         Medication Review Current Outpatient Medications on File Prior to Visit  Medication Sig Dispense Refill   Ascorbic Acid (VITAMIN C PO) Take 1 capsule by mouth daily.      ASPIRIN PO Take 81 mg by mouth daily.     atorvastatin (LIPITOR) 20 MG tablet TAKE 1 TABLET BY MOUTH EVERY DAY FOR CHOLESTEROL 90 tablet 1   Cholecalciferol (VITAMIN D PO) Take 5,000 Units by mouth daily.      metoprolol succinate (TOPROL-XL) 25 MG 24 hr tablet TAKE 1 TABLET DAILY FOR BLOOD PRESSURE 90 tablet 3   Omega-3 Fatty Acids (FISH OIL PO) Take 1 capsule by mouth daily.      Probiotic Product (PROBIOTIC PO) Take by mouth daily.     Zinc 50 MG CAPS Take by mouth.     No current facility-administered medications on file prior to visit.    Current Problems (verified) Patient Active Problem List   Diagnosis Date Noted   Aortic atherosclerosis ([place]) 08/28/2020   BMI 22.0-22.9, adult 06/13/2020   CKD (chronic kidney disease) stage 3, GFR 30-59 ml/min (HCC) 11/02/2019   Seasonal and perennial allergic rhinitis 08/31/2019   Osteopenia 12/23/2014   Medication management 10/24/2013   Hyperlipidemia    Hypertension    Vitamin D deficiency    Depression, major, in remission ([place])    History of right breast cancer     Screening Tests Immunization History  Administered Date(s) Administered   DT (Pediatric) 12/23/2014   Influenza, High Dose Seasonal PF 03/31/2019   Influenza-Unspecified 04/02/2016, 03/16/2018, 04/27/2020   PFIZER(Purple Top)SARS-COV-2 Vaccination 07/13/2019, 08/08/2019, 04/17/2020   Pneumococcal Conjugate-13 12/17/2013   Pneumococcal Polysaccharide-23 10/23/2009   Td 04/15/2005   Zoster, Live 11/19/2010   Preventative care: Last colonoscopy: 05/2014  Dr. Mary Sella, normal, 5 year follow up, hx of polyps but not precancerous, prefers to do cologuard Cologuard: 10/2019 neg DONE Last mammogram: 06/2020 Last pap smear/pelvic exam: 2009 remote DEXA: 06/2018, t -1.8, osteopenia - ORDER  PLACED Cardiolite 2011 negative EF 65%   Prior vaccinations: TD: 2016  Influenza: 03/2020 Pneumococcal: 2011 Prevnar 13: 2015 Shingles/Zostavax: 2012 - ask insurance about shingrix Covid: 2/2, 2021, pfizer + booster  Names of Other Physician/Practitioners you currently use: 1. [facility]- here for primary care 2. Dr. Everlene Farrier, eye doctor, last visit seeing 2020, looking for new provider  3. Dr. Deanna Artis, dentist, last visit q 6 months 2022  Patient Care Team: Unk Pinto, MD as PCP - General (Internal Medicine) Irene Shipper, MD as Consulting Physician (Gastroenterology)   Allergies Allergies  Allergen Reactions   Codeine Nausea And Vomiting    "Feels like I have the flu"   SURGICAL HISTORY She  has a past surgical history that includes Tonsillectomy and adenoidectomy; Abdominal hysterectomy; Breast lumpectomy (Right); and Cataract extraction, bilateral (Bilateral, 2016). FAMILY HISTORY Her family history includes Heart attack (age of onset: 9) in her mother; Heart attack (age of onset: 24) in her father; Leukemia in her brother. SOCIAL HISTORY She  reports that she has never smoked. She has never used smokeless tobacco. She reports that she does not drink alcohol and does not use drugs.  MEDICARE WELLNESS  OBJECTIVES: Physical activity:  no regular exercise at this time Depression/mood screen:   Depression screen [facility] 2/9 01/29/2021  Decreased Interest 0  Down, Depressed, Hopeless 0  PHQ - 2 Score 0  Altered sleeping -  Tired, decreased energy -  Change in appetite -  Feeling bad or failure about yourself  -  Trouble concentrating -  Moving slowly or fidgety/restless -  Suicidal thoughts -  PHQ-9 Score -  Difficult doing work/chores -   Hearing: normal Visual acuity: normal,  does perform annual eye exam  ADLs:  In your present state of health, do you have any difficulty performing the following activities: 01/29/2021  02/17/2020  Hearing? N N  Vision? N N  Difficulty concentrating or making decisions? N N  Walking or climbing stairs? N N  Dressing or bathing? N N  Doing errands, shopping? N N  Some recent data might be hidden    Fall risk: Low Risk Cognitive Testing  Alert? Yes  Normal Appearance?Yes  Oriented to person? Yes  Place? Yes   Time? Yes  Recall of three objects?  Yes  Can perform simple calculations? Yes  Displays appropriate judgment?Yes  Can read the correct time from a watch face?Yes  EOL planning: Does Patient Have a Medical Advance Directive?: No Would patient like information on creating a medical advance directive?: No - Patient declined    Objective:     Today's Vitals   01/29/21 1034  BP: 104/64  Pulse: 67  Temp: (!) 97.2 F (36.2 C)  SpO2: 95%  Weight: 129 lb (58.5 kg)   Body mass index is 22.14 kg/m.  General Appearance: Well nourished, in no apparent distress. Eyes: PERRLA, EOMs, conjunctiva no swelling or erythema Sinuses: No Frontal/maxillary tenderness ENT/Mouth: Ext aud canals with dry, erythematous flaky skin, TMs without erythema, bulging. No erythema, swelling, or exudate on post pharynx.  Tonsils not swollen or erythematous. Hearing normal.  Neck: Supple, thyroid normal.  Respiratory: Respiratory effort normal, BS equal bilaterally without rales, rhonchi, wheezing or stridor.  Cardio: RRR with no MRGs. Brisk peripheral pulses without edema.  Abdomen: Soft, + BS.  Non tender, no guarding, rebound, hernias, masses. Lymphatics: Non tender without lymphadenopathy.  Musculoskeletal: Full ROM, 5/5 strength, Normal gait Skin: Warm, dry without rashes, lesions, ecchymosis.  Neuro: Cranial nerves intact. No cerebellar symptoms.  Psych: Awake and oriented X 3, normal affect, Insight and Judgment appropriate.    Medicare Attestation I have personally reviewed: The patient's medical and social history Their use of alcohol, tobacco or illicit drugs Their  current medications and supplements The patient's functional ability including ADLs,fall risks, home safety risks, cognitive, and hearing and visual impairment Diet and physical activities Evidence for depression or mood disorders  The patient's weight, height, BMI, and visual acuity have been recorded in the chart.  I have made referrals, counseling, and provided education to the patient based on review of the above and I have provided the patient with a written personalized care plan for preventive services.     Izora Ribas, NP   01/29/2021

## 2021-01-29 NOTE — Patient Instructions (Addendum)
  Increase atorvastatin to 40 mg daily  Stop zinc  Reduce saturated fat, increase fiber  Please ask insurance about shingrix - if covered can get at a pharmacy  2 shots 2-6 months part    Please schedule bone density exam with next mammogram   [facility] Schedule an appointment by calling [phone].      Zoster Vaccine, Recombinant injection What is this medication? ZOSTER VACCINE (ZOS ter vak SEEN) is a vaccine used to reduce the risk of getting shingles. This vaccine is not used to treat shingles or nerve pain fromshingles. This medicine may be used for other purposes; ask your health care provider orpharmacist if you have questions. COMMON BRAND NAME(S): [facility] What should I tell my care team before I take this medication? They need to know if you have any of these conditions: cancer immune system problems an unusual or allergic reaction to Zoster vaccine, other medications, foods, dyes, or preservatives pregnant or trying to get pregnant breast-feeding How should I use this medication? This vaccine is injected into a muscle. It is given by a health care provider. A copy of Vaccine Information Statements will be given before each vaccination. Be sure to read this information carefully each time. This sheet may changeoften. Talk to your health care provider about the use of this vaccine in children.This vaccine is not approved for use in children. Overdosage: If you think you have taken too much of this medicine contact apoison control center or emergency room at once. NOTE: This medicine is only for you. Do not share this medicine with others. What if I miss a dose? Keep appointments for follow-up (booster) doses. It is important not to miss your dose. Call your health care provider if you are unable to keep anappointment. What may interact with this medication? medicines that suppress your immune system medicines to treat cancer steroid medicines like  prednisone or cortisone This list may not describe all possible interactions. Give your health care provider a list of all the medicines, herbs, non-prescription drugs, or dietary supplements you use. Also tell them if you smoke, drink alcohol, or use illegaldrugs. Some items may interact with your medicine. What should I watch for while using this medication? Visit your health care provider regularly. This vaccine, like all vaccines, may not fully protect everyone. What side effects may I notice from receiving this medication? Side effects that you should report to your doctor or health care professionalas soon as possible: allergic reactions (skin rash, itching or hives; swelling of the face, lips, or tongue) trouble breathing Side effects that usually do not require medical attention (report these toyour doctor or health care professional if they continue or are bothersome): chills headache fever nausea pain, redness, or irritation at site where injected tiredness vomiting This list may not describe all possible side effects. Call your doctor for medical advice about side effects. You may report side effects to FDA at1-800-FDA-1088. Where should I keep my medication? This vaccine is only given by a health care provider. It will not be stored athome. NOTE: This sheet is a summary. It may not cover all possible information. If you have questions about this medicine, talk to your doctor, pharmacist, orhealth care provider.  2022 Elsevier/Gold Standard (2019-07-20 16:23:07)

## 2021-01-30 LAB — LIPID PANEL
Cholesterol: 239 mg/dL — ABNORMAL HIGH (ref <200)
HDL: 55 mg/dL (ref >=50)
LDL Cholesterol (Calc): 164 mg/dL (calc) — ABNORMAL HIGH
Non-HDL Cholesterol (Calc): 184 mg/dL (calc) — ABNORMAL HIGH (ref <130)
Total CHOL/HDL Ratio: 4.3 (calc) (ref <5.0)
Triglycerides: 92 mg/dL (ref <150)

## 2021-01-30 LAB — COMPLETE METABOLIC PANEL WITH GFR
AG Ratio: 2 (calc) (ref 1.0–2.5)
ALT: 19 U/L (ref 6–29)
AST: 24 U/L (ref 10–35)
Albumin: 4.7 g/dL (ref 3.6–5.1)
Alkaline phosphatase (APISO): 91 U/L (ref 37–153)
BUN/Creatinine Ratio: 21 (calc) (ref 6–22)
BUN: 23 mg/dL (ref 7–25)
CO2: 26 mmol/L (ref 20–32)
Calcium: 9.9 mg/dL (ref 8.6–10.4)
Chloride: 105 mmol/L (ref 98–110)
Creat: 1.1 mg/dL — ABNORMAL HIGH (ref 0.60–0.95)
Globulin: 2.4 g/dL (calc) (ref 1.9–3.7)
Glucose, Bld: 90 mg/dL (ref 65–99)
Potassium: 4.9 mmol/L (ref 3.5–5.3)
Sodium: 141 mmol/L (ref 135–146)
Total Bilirubin: 0.9 mg/dL (ref 0.2–1.2)
Total Protein: 7.1 g/dL (ref 6.1–8.1)
eGFR: 50 mL/min/{1.73_m2} — ABNORMAL LOW (ref >=60)

## 2021-01-30 LAB — CBC WITH DIFFERENTIAL/PLATELET
Absolute Monocytes: 736 cells/uL (ref 200–950)
Basophils Absolute: 19 cells/uL (ref 0–200)
Basophils Relative: 0.3 %
Eosinophils Absolute: 179 cells/uL (ref 15–500)
Eosinophils Relative: 2.8 %
HCT: 45.5 % — ABNORMAL HIGH (ref 35.0–45.0)
Hemoglobin: 14.8 g/dL (ref 11.7–15.5)
Lymphs Abs: 1779 cells/uL (ref 850–3900)
MCH: 28.7 pg (ref 27.0–33.0)
MCHC: 32.5 g/dL (ref 32.0–36.0)
MCV: 88.2 fL (ref 80.0–100.0)
MPV: 10.6 fL (ref 7.5–12.5)
Monocytes Relative: 11.5 %
Neutro Abs: 3686 cells/uL (ref 1500–7800)
Neutrophils Relative %: 57.6 %
Platelets: 503 10*3/uL — ABNORMAL HIGH (ref 140–400)
RBC: 5.16 10*6/uL — ABNORMAL HIGH (ref 3.80–5.10)
RDW: 13.8 % (ref 11.0–15.0)
Total Lymphocyte: 27.8 %
WBC: 6.4 10*3/uL (ref 3.8–10.8)

## 2021-01-30 LAB — TSH: TSH: 2.08 mIU/L (ref 0.40–4.50)

## 2021-01-30 LAB — MAGNESIUM: Magnesium: 2.3 mg/dL (ref 1.5–2.5)

## 2021-04-06 NOTE — Progress Notes (Signed)
Assessment and Plan:  Tiffany Vaughn was seen today for acute visit.  Diagnoses and all orders for this visit:  Vulvar lesion -     WET PREP BY MOLECULAR PROBE -     HSV(herpes simplex vrs) 1+2 ab-IgG -     clotrimazole-betamethasone (LOTRISONE) cream; Apply to affected area 2 times daily x 7 days, 1 time a day x 7 days and qod x 7 days -     lidocaine (XYLOCAINE) 5 % ointment; Apply 1 application topically as needed. - If HSV culture is negative and area persists will refer to GYN for biopsy  Allergic dermatitis -     desonide (DESOWEN) 0.05 % cream; Apply topically 2 (two) times daily. -Apply sparingly as needed to dermatitis around eyes per previous recommendations from dermatology      Further disposition pending results of labs. Discussed med's effects and SE's.   Over 30 minutes of exam, counseling, chart review, and critical decision making was performed.   Future Appointments  Date Time Provider [place]  06/01/2021 10:30 AM Tiffany Bernheim, NP [facility] None  10/05/2021  2:00 PM Unk Pinto, MD [facility] None    ------------------------------------------------------------------------------------------------------------------   HPI BP 128/70   Pulse 70   Temp (!) 96.5 F (35.8 C)   Wt 130 lb 6.4 oz (59.1 kg)   SpO2 96%   BMI 22.38 kg/m  80 y.o.female presents for vaginal irritation  Pt is having irritation and burning on external labia, no d/c or odor.  Symptoms have been going on for several months. No new laundry detergent ,soap. Has been using Poise pads occasionally. Has tried clotrimazole and desonide for short periods without relief.   Pt also has been using Desonide For itching around her eyes which was originally prescribed by her dermatologist.  She using sparingly , needs a refill.    Past Medical History:  Diagnosis Date   Cancer of right breast ([place]) 1992   Hyperlipidemia    Hypertension    Postural hypotension 11/14/2018   Transient global  amnesia 11/14/2018   Vitamin D deficiency      Allergies  Allergen Reactions   Codeine Nausea And Vomiting    "Feels like I have the flu"    Current Outpatient Medications on File Prior to Visit  Medication Sig   Ascorbic Acid (VITAMIN C PO) Take 1 capsule by mouth daily.    ASPIRIN PO Take 81 mg by mouth daily.   atorvastatin (LIPITOR) 40 MG tablet Take 1 tab daily for cholesterol.   Cholecalciferol (VITAMIN D PO) Take 5,000 Units by mouth daily.    metoprolol succinate (TOPROL-XL) 25 MG 24 hr tablet TAKE 1 TABLET DAILY FOR BLOOD PRESSURE   Omega-3 Fatty Acids (FISH OIL PO) Take 1 capsule by mouth daily.    Probiotic Product (PROBIOTIC PO) Take by mouth daily.   No current facility-administered medications on file prior to visit.    ROS: all negative except above.   Physical Exam:  BP 128/70   Pulse 70   Temp (!) 96.5 F (35.8 C)   Wt 130 lb 6.4 oz (59.1 kg)   SpO2 96%   BMI 22.38 kg/m   General Appearance: Well nourished, in no apparent distress. Eyes: PERRLA, EOMs, conjunctiva no swelling or erythema Sinuses: No Frontal/maxillary tenderness ENT/Mouth: Ext aud canals clear, TMs without erythema, bulging. No erythema, swelling, or exudate on post pharynx.  Tonsils not swollen or erythematous. Hearing normal.  Neck: Supple, thyroid normal.  Respiratory: Respiratory effort normal,  BS equal bilaterally without rales, rhonchi, wheezing or stridor.  Cardio: RRR with no MRGs. Brisk peripheral pulses without edema.  Abdomen: Soft, + BS.  Non tender, no guarding, rebound, hernias, masses. Genitourinary: 1-2 cm lesion on left vulva that has yellowish discharge, very painful to touch. Wet mount obtained Lymphatics: Non tender without lymphadenopathy.  Musculoskeletal: Full ROM, 5/5 strength, normal gait.  Skin: Warm, dry without rashes, lesions, ecchymosis.  Neuro: Cranial nerves intact. Normal muscle tone, no cerebellar symptoms. Sensation intact.  Psych: Awake and oriented X  3, normal affect, Insight and Judgment appropriate.     Tiffany Bernheim, NP 11:48 AM [facility]

## 2021-04-07 ENCOUNTER — Ambulatory Visit (INDEPENDENT_AMBULATORY_CARE_PROVIDER_SITE_OTHER): Payer: Medicare HMO | Admitting: Nurse Practitioner

## 2021-04-07 ENCOUNTER — Encounter: Payer: Self-pay | Admitting: Nurse Practitioner

## 2021-04-07 ENCOUNTER — Other Ambulatory Visit: Payer: Self-pay

## 2021-04-07 VITALS — BP 128/70 | HR 70 | Temp 96.5°F | Wt 130.4 lb

## 2021-04-07 DIAGNOSIS — L239 Allergic contact dermatitis, unspecified cause: Secondary | ICD-10-CM | POA: Diagnosis not present

## 2021-04-07 DIAGNOSIS — N9089 Other specified noninflammatory disorders of vulva and perineum: Secondary | ICD-10-CM

## 2021-04-07 MED ORDER — DESONIDE 0.05 % EX CREA: TOPICAL_CREAM | Freq: Two times a day (BID) | CUTANEOUS | 0 refills | Status: DC

## 2021-04-07 MED ORDER — CLOTRIMAZOLE-BETAMETHASONE 1-0.05 % EX CREA: TOPICAL_CREAM | CUTANEOUS | 1 refills | Status: DC

## 2021-04-07 MED ORDER — LIDOCAINE 5 % EX OINT
1.0000 | TOPICAL_OINTMENT | CUTANEOUS | 0 refills | Status: DC | PRN
Start: 2021-04-07 — End: 2021-07-25

## 2021-04-07 NOTE — Patient Instructions (Addendum)
Apply steroid cream twice a day for 7 days, once a day for 7 days and then every other day for 7 days.  Apply lidocaine ointment sparingly prior to using the bathroom

## 2021-04-08 ENCOUNTER — Other Ambulatory Visit: Payer: Self-pay | Admitting: Nurse Practitioner

## 2021-04-08 DIAGNOSIS — B009 Herpesviral infection, unspecified: Secondary | ICD-10-CM

## 2021-04-08 LAB — WET PREP BY MOLECULAR PROBE
Candida species: NOT DETECTED
Gardnerella vaginalis: NOT DETECTED
MICRO NUMBER:: 12490193
SPECIMEN QUALITY:: ADEQUATE
Trichomonas vaginosis: NOT DETECTED

## 2021-04-08 LAB — HSV(HERPES SIMPLEX VRS) I + II AB-IGG
HSV 1 IGG,TYPE SPECIFIC AB: 24.2 {index} — ABNORMAL HIGH
HSV 2 IGG,TYPE SPECIFIC AB: <0.9 index

## 2021-04-08 MED ORDER — VALACYCLOVIR HCL 500 MG PO TABS: 500.0000 mg | ORAL_TABLET | Freq: Two times a day (BID) | ORAL | 0 refills | Status: DC

## 2021-04-08 MED ORDER — VALACYCLOVIR HCL 500 MG PO TABS: 500.0000 mg | ORAL_TABLET | Freq: Two times a day (BID) | ORAL | 0 refills | Status: AC

## 2021-04-09 ENCOUNTER — Telehealth: Payer: Self-pay

## 2021-04-09 NOTE — Telephone Encounter (Signed)
Lidocaine ointment prior auth approved through 07/06/21

## 2021-05-25 DIAGNOSIS — J018 Other acute sinusitis: Secondary | ICD-10-CM | POA: Diagnosis not present

## 2021-05-25 DIAGNOSIS — J069 Acute upper respiratory infection, unspecified: Secondary | ICD-10-CM | POA: Diagnosis not present

## 2021-06-01 ENCOUNTER — Ambulatory Visit: Payer: Medicare HMO | Admitting: Nurse Practitioner

## 2021-06-25 NOTE — Progress Notes (Signed)
3 MONTH FOLLOW UP  Assessment:   Aortic Atherosclerosis- CXR 08/27/20 Control BP, cholesterol and weight  Essential hypertension - at goal, continue medications, DASH diet, exercise and monitor at home. Call if greater than 130/80.  - CBC   Hyperlipidemia -continue Atorvastatin and will adjust dose pending lab result - LDL goal <100 - check lipids, decrease fatty foods, increase activity.  - CMP  Vitamin D deficiency At goal at recent check;  Defer vitamin D level   Depression, remission In remission off of medications   History of cancer of right breast Continue close follow ups, mammogram annually  Osteopenia Continue high calcium diet, vitamin D supplementation, high impact/weight bearing exercises  Seasonal allergies Continue OTC allergy pills  Other abnormal glucose -     Hemoglobin A1c q34m Discussed general issues about diabetes pathophysiology and management., Educational material distributed., Suggested low cholesterol diet., Encouraged aerobic exercise., Discussed foot care., Reminded to get yearly retinal exam.  BMI 22 Continue to recommend diet heavy in fruits and veggies and low in animal meats, cheeses, and dairy products, appropriate calorie intake Discuss exercise recommendations routinely Continue to monitor weight at each visit  CKD 3a Increase fluids, avoid NSAIDS, monitor sugars, will monitor    Future Appointments  Date Time Provider [place]  10/05/2021  2:00 PM Unk Pinto, MD [facility] None     Subjective:   HERMINE FERIA is a 80 y.o. female who presents for HTN, chol, weight, vitamin D def. In May 2020,  patient was hospitalized to r/o CVA and was diagnosed with TGA and Brain scans were negative.  Was on Evista for history of previous right breast cancer 20 years ago but was discontinued. Continues with annual mammograms.   Previous vulvar lesion has resolved.  She has hx of depression in remission for may years,  not on any medications.    BMI is Body mass index is 22.45 kg/m., she has been working on diet, is active, was walking 1 mile daily but got out of the habit after holiday, plans to restart. She is cutting carbs.  Wt Readings from Last 3 Encounters:  06/30/21 130 lb 12.8 oz (59.3 kg)  04/07/21 130 lb 6.4 oz (59.1 kg)  01/29/21 129 lb (58.5 kg)   Her blood pressure has been controlled at home, today their BP is BP: 100/60 BP Readings from Last 3 Encounters:  06/30/21 100/60  04/07/21 128/70  01/29/21 104/64    She does workout, she is on toprol for HA. She denies chest pain, shortness of breath, dizziness. Was having postural hypotension, improved since increasing fluid intake.   She is on cholesterol medication, lipitor 40 mg daily and denies myalgias. Her cholesterol is not at goal. The cholesterol last visit was:   Lab Results  Component Value Date   CHOL 239 (H) 01/29/2021   HDL 55 01/29/2021   LDLCALC 164 (H) 01/29/2021   TRIG 92 01/29/2021   CHOLHDL 4.3 01/29/2021    She has been working on diet and exercise for prediabetes, and denies foot ulcerations, increased appetite, nausea, paresthesia of the feet, polydipsia, polyuria, visual disturbances, vomiting and weight loss. Last A1C in the office was:  Lab Results  Component Value Date   HGBA1C 6.1 (H) 10/02/2020   She has CKD 3a monitored at this office. Last GFR:  Lab Results  Component Value Date   GFRNONAA 52 (L) 10/02/2020   Patient is on Vitamin D supplement.   Lab Results  Component Value Date  VD25OH 79 10/02/2020        Medication Review Current Outpatient Medications on File Prior to Visit  Medication Sig Dispense Refill   Ascorbic Acid (VITAMIN C PO) Take 1 capsule by mouth daily.      ASPIRIN PO Take 81 mg by mouth daily.     atorvastatin (LIPITOR) 40 MG tablet Take 1 tab daily for cholesterol. 90 tablet 1   Cholecalciferol (VITAMIN D PO) Take 5,000 Units by mouth daily.       clotrimazole-betamethasone (LOTRISONE) cream Apply to affected area 2 times daily 15 g 1   desonide (DESOWEN) 0.05 % cream Apply topically 2 (two) times daily. 30 g 0   lidocaine (XYLOCAINE) 5 % ointment Apply 1 application topically as needed. 35.44 g 0   metoprolol succinate (TOPROL-XL) 25 MG 24 hr tablet TAKE 1 TABLET DAILY FOR BLOOD PRESSURE 90 tablet 3   Omega-3 Fatty Acids (FISH OIL PO) Take 1 capsule by mouth daily.      Probiotic Product (PROBIOTIC PO) Take by mouth daily.     No current facility-administered medications on file prior to visit.    Current Problems (verified) Patient Active Problem List   Diagnosis Date Noted   Aortic atherosclerosis ([place]) 08/28/2020   BMI 22.0-22.9, adult 06/13/2020   CKD (chronic kidney disease) stage 3, GFR 30-59 ml/min ([facility]) 11/02/2019   Seasonal and perennial allergic rhinitis 08/31/2019   Osteopenia 12/23/2014   Medication management 10/24/2013   Hyperlipidemia    Hypertension    Vitamin D deficiency    Depression, major, in remission ([place])    History of right breast cancer     Allergies Allergies  Allergen Reactions   Codeine Nausea And Vomiting    "Feels like I have the flu"   SURGICAL HISTORY She  has a past surgical history that includes Tonsillectomy and adenoidectomy; Abdominal hysterectomy; Breast lumpectomy (Right); and Cataract extraction, bilateral (Bilateral, 2016). FAMILY HISTORY Her family history includes Heart attack (age of onset: 25) in her mother; Heart attack (age of onset: 58) in her father; Leukemia in her brother. SOCIAL HISTORY She  reports that she has never smoked. She has never used smokeless tobacco. She reports that she does not drink alcohol and does not use drugs.  Review of Systems  Constitutional:  Negative for malaise/fatigue and weight loss.  HENT:  Negative for hearing loss and tinnitus.   Eyes:  Negative for blurred vision and double vision.  Respiratory:  Negative for cough, shortness of  breath and wheezing.   Cardiovascular:  Negative for chest pain, palpitations, orthopnea, claudication and leg swelling.  Gastrointestinal:  Negative for abdominal pain, blood in stool, constipation, diarrhea, heartburn, melena, nausea and vomiting.  Genitourinary: Negative.   Musculoskeletal:  Negative for joint pain and myalgias.  Skin:  Negative for rash.  Neurological:  Negative for dizziness, tingling, sensory change, weakness and headaches.  Endo/Heme/Allergies:  Negative for polydipsia.  Psychiatric/Behavioral: Negative.    All other systems reviewed and are negative.   Objective:     Today's Vitals   06/30/21 1034  BP: 100/60  Pulse: 71  Temp: 97.9 F (36.6 C)  SpO2: 95%  Weight: 130 lb 12.8 oz (59.3 kg)   Body mass index is 22.45 kg/m.  General Appearance: Well nourished, in no apparent distress. Eyes: PERRLA, EOMs, conjunctiva no swelling or erythema Sinuses: No Frontal/maxillary tenderness ENT/Mouth: Ext aud canals with dry, TMs without erythema, bulging. No erythema, swelling, or exudate on post pharynx.  Tonsils not swollen or  erythematous. Hearing normal.  Neck: Supple, thyroid normal.  Respiratory: Respiratory effort normal, BS equal bilaterally without rales, rhonchi, wheezing or stridor.  Cardio: RRR with no MRGs. Brisk peripheral pulses without edema.  Abdomen: Soft, + BS.  Non tender, no guarding, rebound, hernias, masses. Lymphatics: Non tender without lymphadenopathy.  Musculoskeletal: Full ROM, 5/5 strength, Normal gait Skin: Warm, dry without rashes, lesions, ecchymosis.  Neuro: Cranial nerves intact. No cerebellar symptoms.  Psych: Awake and oriented X 3, normal affect, Insight and Judgment appropriate.     Magda Bernheim, NP   06/30/2021

## 2021-06-30 ENCOUNTER — Encounter: Payer: Self-pay | Admitting: Nurse Practitioner

## 2021-06-30 ENCOUNTER — Ambulatory Visit (INDEPENDENT_AMBULATORY_CARE_PROVIDER_SITE_OTHER): Payer: Medicare HMO | Admitting: Nurse Practitioner

## 2021-06-30 ENCOUNTER — Other Ambulatory Visit: Payer: Self-pay

## 2021-06-30 VITALS — BP 100/60 | HR 71 | Temp 97.9°F | Wt 130.8 lb

## 2021-06-30 DIAGNOSIS — M8580 Other specified disorders of bone density and structure, unspecified site: Secondary | ICD-10-CM | POA: Diagnosis not present

## 2021-06-30 DIAGNOSIS — R69 Illness, unspecified: Secondary | ICD-10-CM | POA: Diagnosis not present

## 2021-06-30 DIAGNOSIS — Z853 Personal history of malignant neoplasm of breast: Secondary | ICD-10-CM | POA: Diagnosis not present

## 2021-06-30 DIAGNOSIS — E782 Mixed hyperlipidemia: Secondary | ICD-10-CM

## 2021-06-30 DIAGNOSIS — J3089 Other allergic rhinitis: Secondary | ICD-10-CM | POA: Diagnosis not present

## 2021-06-30 DIAGNOSIS — I10 Essential (primary) hypertension: Secondary | ICD-10-CM

## 2021-06-30 DIAGNOSIS — I700 Atherosclerosis of aorta: Secondary | ICD-10-CM | POA: Diagnosis not present

## 2021-06-30 DIAGNOSIS — J302 Other seasonal allergic rhinitis: Secondary | ICD-10-CM

## 2021-06-30 DIAGNOSIS — R7309 Other abnormal glucose: Secondary | ICD-10-CM

## 2021-06-30 DIAGNOSIS — E559 Vitamin D deficiency, unspecified: Secondary | ICD-10-CM | POA: Diagnosis not present

## 2021-06-30 DIAGNOSIS — D75839 Thrombocytosis, unspecified: Secondary | ICD-10-CM | POA: Diagnosis not present

## 2021-06-30 DIAGNOSIS — F325 Major depressive disorder, single episode, in full remission: Secondary | ICD-10-CM

## 2021-06-30 DIAGNOSIS — N1831 Chronic kidney disease, stage 3a: Secondary | ICD-10-CM

## 2021-06-30 DIAGNOSIS — D509 Iron deficiency anemia, unspecified: Secondary | ICD-10-CM | POA: Diagnosis not present

## 2021-06-30 DIAGNOSIS — R5383 Other fatigue: Secondary | ICD-10-CM | POA: Diagnosis not present

## 2021-06-30 NOTE — Patient Instructions (Signed)

## 2021-07-01 ENCOUNTER — Other Ambulatory Visit: Payer: Self-pay | Admitting: Nurse Practitioner

## 2021-07-01 DIAGNOSIS — D75839 Thrombocytosis, unspecified: Secondary | ICD-10-CM

## 2021-07-02 ENCOUNTER — Other Ambulatory Visit: Payer: Self-pay | Admitting: Nurse Practitioner

## 2021-07-02 DIAGNOSIS — D75839 Thrombocytosis, unspecified: Secondary | ICD-10-CM

## 2021-07-02 LAB — TEST AUTHORIZATION

## 2021-07-02 LAB — CBC WITH DIFFERENTIAL/PLATELET
Absolute Monocytes: 701 cells/uL (ref 200–950)
Basophils Absolute: 31 cells/uL (ref 0–200)
Basophils Relative: 0.4 %
Eosinophils Absolute: 262 cells/uL (ref 15–500)
Eosinophils Relative: 3.4 %
HCT: 46.9 % — ABNORMAL HIGH (ref 35.0–45.0)
Hemoglobin: 15.5 g/dL (ref 11.7–15.5)
Lymphs Abs: 2002 cells/uL (ref 850–3900)
MCH: 29.2 pg (ref 27.0–33.0)
MCHC: 33 g/dL (ref 32.0–36.0)
MCV: 88.5 fL (ref 80.0–100.0)
MPV: 10.3 fL (ref 7.5–12.5)
Monocytes Relative: 9.1 %
Neutro Abs: 4705 cells/uL (ref 1500–7800)
Neutrophils Relative %: 61.1 %
Platelets: 559 10*3/uL — ABNORMAL HIGH (ref 140–400)
RBC: 5.3 10*6/uL — ABNORMAL HIGH (ref 3.80–5.10)
RDW: 13.6 % (ref 11.0–15.0)
Total Lymphocyte: 26 %
WBC: 7.7 10*3/uL (ref 3.8–10.8)

## 2021-07-02 LAB — HEMOGLOBIN A1C
Hgb A1c MFr Bld: 6.2 % of total Hgb — ABNORMAL HIGH (ref <5.7)
Mean Plasma Glucose: 131 mg/dL
eAG (mmol/L): 7.3 mmol/L

## 2021-07-02 LAB — COMPLETE METABOLIC PANEL WITH GFR
AG Ratio: 1.6 (calc) (ref 1.0–2.5)
ALT: 22 U/L (ref 6–29)
AST: 25 U/L (ref 10–35)
Albumin: 4.5 g/dL (ref 3.6–5.1)
Alkaline phosphatase (APISO): 99 U/L (ref 37–153)
BUN: 19 mg/dL (ref 7–25)
CO2: 28 mmol/L (ref 20–32)
Calcium: 10 mg/dL (ref 8.6–10.4)
Chloride: 105 mmol/L (ref 98–110)
Creat: 0.92 mg/dL (ref 0.60–0.95)
Globulin: 2.8 g/dL (calc) (ref 1.9–3.7)
Glucose, Bld: 97 mg/dL (ref 65–99)
Potassium: 5.1 mmol/L (ref 3.5–5.3)
Sodium: 141 mmol/L (ref 135–146)
Total Bilirubin: 0.9 mg/dL (ref 0.2–1.2)
Total Protein: 7.3 g/dL (ref 6.1–8.1)
eGFR: 62 mL/min/{1.73_m2} (ref >=60)

## 2021-07-02 LAB — LIPID PANEL
Cholesterol: 196 mg/dL (ref <200)
HDL: 56 mg/dL (ref >=50)
LDL Cholesterol (Calc): 117 mg/dL (calc) — ABNORMAL HIGH
Non-HDL Cholesterol (Calc): 140 mg/dL (calc) — ABNORMAL HIGH (ref <130)
Total CHOL/HDL Ratio: 3.5 (calc) (ref <5.0)
Triglycerides: 122 mg/dL (ref <150)

## 2021-07-02 LAB — PATHOLOGIST SMEAR REVIEW

## 2021-07-02 LAB — FERRITIN: Ferritin: 52 ng/mL (ref 16–288)

## 2021-07-24 ENCOUNTER — Emergency Department (HOSPITAL_BASED_OUTPATIENT_CLINIC_OR_DEPARTMENT_OTHER): Payer: Medicare HMO

## 2021-07-24 ENCOUNTER — Other Ambulatory Visit: Payer: Self-pay

## 2021-07-24 ENCOUNTER — Inpatient Hospital Stay (HOSPITAL_BASED_OUTPATIENT_CLINIC_OR_DEPARTMENT_OTHER)
Admission: EM | Admit: 2021-07-24 | Discharge: 2021-07-26 | DRG: 070 | Disposition: A | Discharge status: Home / Self Care | Payer: Medicare HMO | Attending: Internal Medicine | Admitting: Internal Medicine

## 2021-07-24 ENCOUNTER — Encounter (HOSPITAL_BASED_OUTPATIENT_CLINIC_OR_DEPARTMENT_OTHER): Payer: Self-pay

## 2021-07-24 DIAGNOSIS — G8389 Other specified paralytic syndromes: Secondary | ICD-10-CM | POA: Diagnosis present

## 2021-07-24 DIAGNOSIS — Z8249 Family history of ischemic heart disease and other diseases of the circulatory system: Secondary | ICD-10-CM

## 2021-07-24 DIAGNOSIS — I639 Cerebral infarction, unspecified: Secondary | ICD-10-CM | POA: Diagnosis not present

## 2021-07-24 DIAGNOSIS — Z806 Family history of leukemia: Secondary | ICD-10-CM | POA: Diagnosis not present

## 2021-07-24 DIAGNOSIS — R413 Other amnesia: Secondary | ICD-10-CM | POA: Diagnosis not present

## 2021-07-24 DIAGNOSIS — Z885 Allergy status to narcotic agent status: Secondary | ICD-10-CM | POA: Diagnosis not present

## 2021-07-24 DIAGNOSIS — I129 Hypertensive chronic kidney disease with stage 1 through stage 4 chronic kidney disease, or unspecified chronic kidney disease: Secondary | ICD-10-CM | POA: Diagnosis present

## 2021-07-24 DIAGNOSIS — G9340 Encephalopathy, unspecified: Secondary | ICD-10-CM

## 2021-07-24 DIAGNOSIS — I083 Combined rheumatic disorders of mitral, aortic and tricuspid valves: Secondary | ICD-10-CM | POA: Diagnosis present

## 2021-07-24 DIAGNOSIS — E782 Mixed hyperlipidemia: Secondary | ICD-10-CM | POA: Diagnosis not present

## 2021-07-24 DIAGNOSIS — D75839 Thrombocytosis, unspecified: Secondary | ICD-10-CM | POA: Diagnosis present

## 2021-07-24 DIAGNOSIS — N183 Chronic kidney disease, stage 3 unspecified: Secondary | ICD-10-CM | POA: Diagnosis present

## 2021-07-24 DIAGNOSIS — E785 Hyperlipidemia, unspecified: Secondary | ICD-10-CM | POA: Diagnosis present

## 2021-07-24 DIAGNOSIS — G9349 Other encephalopathy: Secondary | ICD-10-CM | POA: Diagnosis present

## 2021-07-24 DIAGNOSIS — R110 Nausea: Secondary | ICD-10-CM | POA: Diagnosis present

## 2021-07-24 DIAGNOSIS — G454 Transient global amnesia: Secondary | ICD-10-CM | POA: Diagnosis not present

## 2021-07-24 DIAGNOSIS — Z853 Personal history of malignant neoplasm of breast: Secondary | ICD-10-CM

## 2021-07-24 DIAGNOSIS — I6389 Other cerebral infarction: Secondary | ICD-10-CM | POA: Diagnosis not present

## 2021-07-24 DIAGNOSIS — N1831 Chronic kidney disease, stage 3a: Secondary | ICD-10-CM | POA: Diagnosis present

## 2021-07-24 DIAGNOSIS — I10 Essential (primary) hypertension: Secondary | ICD-10-CM | POA: Diagnosis present

## 2021-07-24 DIAGNOSIS — R569 Unspecified convulsions: Secondary | ICD-10-CM | POA: Diagnosis not present

## 2021-07-24 DIAGNOSIS — G43909 Migraine, unspecified, not intractable, without status migrainosus: Secondary | ICD-10-CM | POA: Diagnosis present

## 2021-07-24 DIAGNOSIS — Z20822 Contact with and (suspected) exposure to covid-19: Secondary | ICD-10-CM | POA: Diagnosis present

## 2021-07-24 DIAGNOSIS — Z7982 Long term (current) use of aspirin: Secondary | ICD-10-CM | POA: Diagnosis not present

## 2021-07-24 DIAGNOSIS — Z79899 Other long term (current) drug therapy: Secondary | ICD-10-CM | POA: Diagnosis not present

## 2021-07-24 DIAGNOSIS — R000 Tachycardia, unspecified: Secondary | ICD-10-CM | POA: Diagnosis not present

## 2021-07-24 DIAGNOSIS — R404 Transient alteration of awareness: Secondary | ICD-10-CM | POA: Diagnosis not present

## 2021-07-24 DIAGNOSIS — E538 Deficiency of other specified B group vitamins: Secondary | ICD-10-CM | POA: Diagnosis present

## 2021-07-24 DIAGNOSIS — R29702 NIHSS score 2: Secondary | ICD-10-CM | POA: Diagnosis present

## 2021-07-24 DIAGNOSIS — R29818 Other symptoms and signs involving the nervous system: Secondary | ICD-10-CM | POA: Diagnosis not present

## 2021-07-24 LAB — RAPID URINE DRUG SCREEN, HOSP PERFORMED
Amphetamines: NOT DETECTED
Barbiturates: NOT DETECTED
Benzodiazepines: NOT DETECTED
Cocaine: NOT DETECTED
Opiates: NOT DETECTED
Tetrahydrocannabinol: NOT DETECTED

## 2021-07-24 LAB — ETHANOL: Alcohol, Ethyl (B): <10 mg/dL (ref <10)

## 2021-07-24 LAB — URINALYSIS, ROUTINE W REFLEX MICROSCOPIC
Bilirubin Urine: NEGATIVE
Glucose, UA: NEGATIVE mg/dL
Hgb urine dipstick: NEGATIVE
Ketones, ur: NEGATIVE mg/dL
Leukocytes,Ua: NEGATIVE
Nitrite: NEGATIVE
Protein, ur: NEGATIVE mg/dL
Specific Gravity, Urine: 1.016 (ref 1.005–1.030)
pH: 6.5 (ref 5.0–8.0)

## 2021-07-24 LAB — DIFFERENTIAL
Abs Immature Granulocytes: 0 10*3/uL (ref 0.00–0.07)
Basophils Absolute: 0.1 10*3/uL (ref 0.0–0.1)
Basophils Relative: 1 %
Eosinophils Absolute: 0.4 10*3/uL (ref 0.0–0.5)
Eosinophils Relative: 3 %
Lymphocytes Relative: 49 %
Lymphs Abs: 6 10*3/uL — ABNORMAL HIGH (ref 0.7–4.0)
Monocytes Absolute: 1.1 10*3/uL — ABNORMAL HIGH (ref 0.1–1.0)
Monocytes Relative: 9 %
Neutro Abs: 4.6 10*3/uL (ref 1.7–7.7)
Neutrophils Relative %: 38 %

## 2021-07-24 LAB — RESP PANEL BY RT-PCR (FLU A&B, COVID) ARPGX2
Influenza A by PCR: NEGATIVE
Influenza B by PCR: NEGATIVE
SARS Coronavirus 2 by RT PCR: NEGATIVE

## 2021-07-24 LAB — COMPREHENSIVE METABOLIC PANEL
ALT: 19 U/L (ref 0–44)
AST: 23 U/L (ref 15–41)
Albumin: 5 g/dL (ref 3.5–5.0)
Alkaline Phosphatase: 103 U/L (ref 38–126)
Anion gap: 13 (ref 5–15)
BUN: 20 mg/dL (ref 8–23)
CO2: 24 mmol/L (ref 22–32)
Calcium: 10 mg/dL (ref 8.9–10.3)
Chloride: 102 mmol/L (ref 98–111)
Creatinine, Ser: 0.98 mg/dL (ref 0.44–1.00)
GFR, Estimated: 58 mL/min — ABNORMAL LOW (ref >60)
Glucose, Bld: 95 mg/dL (ref 70–99)
Potassium: 3.8 mmol/L (ref 3.5–5.1)
Sodium: 139 mmol/L (ref 135–145)
Total Bilirubin: 0.7 mg/dL (ref 0.3–1.2)
Total Protein: 8.1 g/dL (ref 6.5–8.1)

## 2021-07-24 LAB — APTT: aPTT: 29 seconds (ref 24–36)

## 2021-07-24 LAB — CBC
HCT: 45.5 % (ref 36.0–46.0)
Hemoglobin: 14.8 g/dL (ref 12.0–15.0)
MCH: 28.9 pg (ref 26.0–34.0)
MCHC: 32.5 g/dL (ref 30.0–36.0)
MCV: 88.9 fL (ref 80.0–100.0)
Platelets: 599 10*3/uL — ABNORMAL HIGH (ref 150–400)
RBC: 5.12 MIL/uL — ABNORMAL HIGH (ref 3.87–5.11)
RDW: 14.1 % (ref 11.5–15.5)
WBC: 12.2 10*3/uL — ABNORMAL HIGH (ref 4.0–10.5)
nRBC: 0 % (ref 0.0–0.2)

## 2021-07-24 LAB — PROTIME-INR
INR: 0.9 (ref 0.8–1.2)
Prothrombin Time: 12.3 seconds (ref 11.4–15.2)

## 2021-07-24 LAB — CBG MONITORING, ED
Glucose-Capillary: 91 mg/dL (ref 70–99)
Glucose-Capillary: 97 mg/dL (ref 70–99)

## 2021-07-24 MED ORDER — ASPIRIN EC 81 MG PO TBEC
81.0000 mg | DELAYED_RELEASE_TABLET | Freq: Every day | ORAL | Status: DC
  Administered 2021-07-25 – 2021-07-26 (×2): 81 mg via ORAL
  Filled 2021-07-24 (×2): qty 1

## 2021-07-24 MED ORDER — ACETAMINOPHEN 650 MG RE SUPP: 650.0000 mg | RECTAL | Status: DC | PRN

## 2021-07-24 MED ORDER — ATORVASTATIN CALCIUM 40 MG PO TABS
40.0000 mg | ORAL_TABLET | Freq: Every day | ORAL | Status: DC
  Administered 2021-07-25: 40 mg via ORAL
  Filled 2021-07-24: qty 1

## 2021-07-24 MED ORDER — ENOXAPARIN SODIUM 40 MG/0.4ML IJ SOSY
40.0000 mg | PREFILLED_SYRINGE | Freq: Every day | INTRAMUSCULAR | Status: DC
  Administered 2021-07-25 – 2021-07-26 (×2): 40 mg via SUBCUTANEOUS
  Filled 2021-07-24 (×2): qty 0.4

## 2021-07-24 MED ORDER — SENNOSIDES-DOCUSATE SODIUM 8.6-50 MG PO TABS: 1.0000 | ORAL_TABLET | Freq: Every evening | ORAL | Status: DC | PRN

## 2021-07-24 MED ORDER — CLOPIDOGREL BISULFATE 300 MG PO TABS
300.0000 mg | ORAL_TABLET | Freq: Once | ORAL | Status: AC
Start: 2021-07-24 — End: 2021-07-24
  Administered 2021-07-24: 300 mg via ORAL
  Filled 2021-07-24: qty 1

## 2021-07-24 MED ORDER — SODIUM CHLORIDE 0.9% FLUSH
3.0000 mL | Freq: Once | INTRAVENOUS | Status: AC
  Administered 2021-07-24: 3 mL via INTRAVENOUS
  Filled 2021-07-24: qty 3

## 2021-07-24 MED ORDER — ACETAMINOPHEN 325 MG PO TABS: 650.0000 mg | ORAL_TABLET | ORAL | Status: DC | PRN

## 2021-07-24 MED ORDER — ACETAMINOPHEN 160 MG/5ML PO SOLN: 650.0000 mg | ORAL | Status: DC | PRN

## 2021-07-24 MED ORDER — CLOPIDOGREL BISULFATE 75 MG PO TABS
75.0000 mg | ORAL_TABLET | Freq: Every day | ORAL | Status: DC
  Administered 2021-07-25 – 2021-07-26 (×2): 75 mg via ORAL
  Filled 2021-07-24 (×2): qty 1

## 2021-07-24 NOTE — Consult Note (Signed)
[facility] Consult Services   Patient Name:   Tiffany Vaughn, Tiffany Vaughn Date of Birth:   July 26, 1940 Identification Number:   MRN - [id number] Date of Service:   07/24/2021 19:27:50  Diagnosis:       G93.49 - Encephalopathy Multifactorial  Impression:      81 y/o with PMH of reported thrombocytosis, HTN, dyslipidemia, abnormal A1C, breast cancer. LKW 17:30 today with subsequent forgetfulness and confusion. 2-3 years ago she had the same problem after learning bad news. She did not have a stressful event today. She denies other symptoms. NIHSS 2 (not oriented). NCCT head without acuity. DDx: TGA, seizure, stroke, migraine, with the former two most likely.    I do not recommend thrombolysis based on this differential diagnosis. LVO is unlikely.    Recommendations:  1) Enhanced brain MRI with MRA head/neck  2) EEG  3) Start dual antiplatelet therapy pending MRI:   -Today: ASA81 mg + clopidogrel 300mg    -Tomorrow: ASA 81 + clopidogrel 75mg  QD for 90 days   -After 90 days discontinue ASA and continue clopidogrel 75mg  QD  4) Neurology f/u    Metrics: Last Known Well: 07/24/2021 17:30:32 [facility] Notification Time: 07/24/2021 19:27:50 Arrival Time: 07/24/2021 18:50:22 Stamp Time: 07/24/2021 19:27:50 Initial Response Time: 07/24/2021 19:31:27 Symptoms: confusion. NIHSS Start Assessment Time: 07/24/2021 19:35:31 Patient is not a candidate for Thrombolytic. Thrombolytic Medical Decision: 07/24/2021 19:33:28 Patient was not deemed candidate for Thrombolytic because of following reasons: Other Diagnosis suspected.  CT head showed no acute hemorrhage or acute core infarct.  ED Physician notified of diagnostic impression and management plan on 07/24/2021 19:38:03  Advanced Imaging: Advanced Imaging Not Completed because:  lvo unlikely    ------------------------------------------------------------------------------  History of Present  Illness: Patient is a 81 year old Female.  Patient was brought by private transportation with symptoms of confusion. 81 y/o with PMH of reported thrombocytosis, HTN, dyslipidemia, abnormal A1C, breast cancer. LKW 17:30 today with subsequent forgetfulness and confusion. 2-3 years ago she had the same problem after learning bad news. She did not have a stressful event today. She denies headache, weakness, numbness.   Past Medical History:      There is no history of Hypertension  Medications:  No Anticoagulant use  Antiplatelet use: Yes asa Reviewed EMR for current medications  Allergies:  Reviewed  Social History: Drug Use: No  Family History:  There is no family history of premature cerebrovascular disease pertinent to this consultation  ROS : 14 Points Review of Systems was performed and was negative except mentioned in HPI.  Past Surgical History: There Is No Surgical History Contributory To Todays Visit    Examination: BP(180/86), Pulse(97), Blood Glucose(96) 1A: Level of Consciousness - Alert; keenly responsive + 0 1B: Ask Month and Age - Both Questions Right + 0 1C: Blink Eyes & Squeeze Hands - Performs Both Tasks + 0 2: Test Horizontal Extraocular Movements - Normal + 0 3: Test Visual Fields - No Visual Loss + 0 4: Test Facial Palsy (Use Grimace if Obtunded) - Normal symmetry + 0 5A: Test Left Arm Motor Drift - No Drift for 10 Seconds + 0 5B: Test Right Arm Motor Drift - No Drift for 10 Seconds + 0 6A: Test Left Leg Motor Drift - No Drift for 5 Seconds + 0 6B: Test Right Leg Motor Drift - No Drift for 5 Seconds + 0 7: Test Limb Ataxia (FNF/Heel-Shin) - No Ataxia + 0 8: Test Sensation - Normal; No sensory loss + 0 9:  Test Language/Aphasia - Normal; No aphasia + 0 10: Test Dysarthria - Normal + 0 11: Test Extinction/Inattention - No abnormality + 0  NIHSS Score: 0   Pre-Morbid Modified Rankin Scale: 0 Points = No symptoms at all   Patient/Family was  informed the Neurology Consult would occur via TeleHealth consult by way of interactive audio and video telecommunications and consented to receiving care in this manner.   Patient is being evaluated for possible acute neurologic impairment and high probability of imminent or life-threatening deterioration. I spent total of 30 minutes providing care to this patient, including time for face to face visit via telemedicine, review of medical records, imaging studies and discussion of findings with providers, the patient and/or family.   Dr Jonelle Sidle   [facility] [phone]   Case [id number]

## 2021-07-24 NOTE — ED Notes (Signed)
Consent to transport was signed by the Pt's husband.

## 2021-07-24 NOTE — ED Triage Notes (Signed)
Per spouse and pt pt had been outside raking her yard, came inside and was "reading her phone" when she felt confused and couldn't remember the last 10 mins. Pt also reports "fast heart" and all over body tremors. Tremors to BLE. Spouse in triage able to calm pt. Pt appears scared in triage, ask, "I'm not dying am I"

## 2021-07-24 NOTE — Plan of Care (Signed)
?  Problem: Education: ?Goal: Knowledge of disease or condition will improve ?Outcome: Progressing ?Goal: Knowledge of secondary prevention will improve (SELECT ALL) ?Outcome: Progressing ?Goal: Knowledge of patient specific risk factors will improve (INDIVIDUALIZE FOR PATIENT) ?Outcome: Progressing ?Goal: Individualized Educational Video(s) ?Outcome: Progressing ?  ?Problem: Coping: ?Goal: Will verbalize positive feelings about self ?Outcome: Progressing ?Goal: Will identify appropriate support needs ?Outcome: Progressing ?  ?Problem: Health Behavior/Discharge Planning: ?Goal: Ability to manage health-related needs will improve ?Outcome: Progressing ?  ?Problem: Self-Care: ?Goal: Ability to participate in self-care as condition permits will improve ?Outcome: Progressing ?Goal: Verbalization of feelings and concerns over difficulty with self-care will improve ?Outcome: Progressing ?Goal: Ability to communicate needs accurately will improve ?Outcome: Progressing ?  ?Problem: Ischemic Stroke/TIA Tissue Perfusion: ?Goal: Complications of ischemic stroke/TIA will be minimized ?Outcome: Progressing ?  ?Problem: Education: ?Goal: Knowledge of General Education information will improve ?Description: Including pain rating scale, medication(s)/side effects and non-pharmacologic comfort measures ?Outcome: Progressing ?  ?Problem: Health Behavior/Discharge Planning: ?Goal: Ability to manage health-related needs will improve ?Outcome: Progressing ?  ?Problem: Clinical Measurements: ?Goal: Ability to maintain clinical measurements within normal limits will improve ?Outcome: Progressing ?Goal: Will remain free from infection ?Outcome: Progressing ?Goal: Diagnostic test results will improve ?Outcome: Progressing ?Goal: Respiratory complications will improve ?Outcome: Progressing ?Goal: Cardiovascular complication will be avoided ?Outcome: Progressing ?  ?Problem: Activity: ?Goal: Risk for activity intolerance will  decrease ?Outcome: Progressing ?  ?Problem: Nutrition: ?Goal: Adequate nutrition will be maintained ?Outcome: Progressing ?  ?Problem: Coping: ?Goal: Level of anxiety will decrease ?Outcome: Progressing ?  ?Problem: Elimination: ?Goal: Will not experience complications related to bowel motility ?Outcome: Progressing ?Goal: Will not experience complications related to urinary retention ?Outcome: Progressing ?  ?Problem: Pain Managment: ?Goal: General experience of comfort will improve ?Outcome: Progressing ?  ?Problem: Safety: ?Goal: Ability to remain free from injury will improve ?Outcome: Progressing ?  ?Problem: Skin Integrity: ?Goal: Risk for impaired skin integrity will decrease ?Outcome: Progressing ?  ?

## 2021-07-24 NOTE — Consult Note (Addendum)
Neurology Consultation Reason for Consult: Memory Difficulty Referring Physician: Trilby Drummer, A  CC: Memory Difficulty   History is obtained from:patient  HPI: Tiffany Vaughn is a 81 y.o. female with a history of htn, hpl, TGA who presents with anterograde amnesia.  She was outside working in the yard, and then she came inside to her husband saying she could not remember anything.  She has been repeatedly asking the same questions over and over, but the husband states that he asked her questions about things like her grandkids names, and other remote history and she is able to recall it well.  She was not sure of where she was when she was at [place], and kept asking why she was there.  She is improving, currently remembers being at [place] and coming over by ambulance.  She also complains of a headache, bifrontal in nature as well as mild nausea.  She states this feels like the migraines that she used to get when she was younger.  LKW: 5 PM  ROS: A 14 point ROS was performed and is negative except as noted in the HPI.  Past Medical History:  Diagnosis Date   Cancer of right breast ([place]) 1992   Hyperlipidemia    Hypertension    Postural hypotension 11/14/2018   Transient global amnesia 11/14/2018   Vitamin D deficiency      Family History  Problem Relation Age of Onset   Heart attack Mother 80   Heart attack Father 64   Leukemia Brother      Social History:  reports that she has never smoked. She has never used smokeless tobacco. She reports that she does not drink alcohol and does not use drugs.   Exam: Current vital signs: BP (!) 173/93 (BP Location: Right Arm)    Pulse 89    Temp 98.1 F (36.7 C) (Oral)    Resp 16    Ht 5\' 4"  (1.626 m)    Wt 59.6 kg    SpO2 96%    BMI 22.55 kg/m  Vital signs in last 24 hours: Temp:  [97.9 F (36.6 C)-98.1 F (36.7 C)] 98.1 F (36.7 C) (01/27 2232) Pulse Rate:  [89-122] 89 (01/27 2232) Resp:  [13-20] 16  (01/27 2232) BP: (167-190)/(90-110) 173/93 (01/27 2232) SpO2:  [93 %-100 %] 96 % (01/27 2232) Weight:  [59.6 kg-59.9 kg] 59.6 kg (01/27 2232)   Physical Exam  Constitutional: Appears well-developed and well-nourished.  Psych: Affect appropriate to situation Eyes: No scleral injection HENT: No OP obstruction MSK: no joint deformities.  Cardiovascular: Normal rate and regular rhythm.  Respiratory: Effort normal, non-labored breathing GI: Soft.  No distension. There is no tenderness.  Skin: WDI  Neuro: Mental Status: Patient is awake, alert, oriented to person, place, month, unable to give year, and situation. Patient is able to give a clear and coherent history. No signs of aphasia or neglect Cranial Nerves: II: Visual Fields are full. Pupils are equal, round, and reactive to light.   III,IV, VI: EOMI without ptosis or diploplia.  V: Facial sensation is symmetric to temperature VII: Facial movement is symmetric.  VIII: hearing is intact to voice X: Uvula elevates symmetrically XI: Shoulder shrug is symmetric. XII: tongue is midline without atrophy or fasciculations.  Motor: Tone is normal. Bulk is normal. 5/5 strength was present in all four extremities.  Sensory: Sensation is symmetric to light touch and temperature in the arms and legs. Cerebellar: FNF and HKS are intact  bilaterally  I have reviewed labs in epic and the results pertinent to this consultation are: CMP-unremarkable UDS negative  I have reviewed the images obtained: CT head is negative  Impression: 81 year old female with what is likely recurrent transient global amnesia.  The recurrence rate of TGA is relatively low, but it is certainly not unheard of to have recurrent TGA.  Other possibilities including TIA/stroke/seizure are considered less likely.  Given that recurrence is uncommon, I do think repeating work-up with MRI and EEG is reasonable, but if these are negative then no further work-up would be  needed.  I suspect that her headache is a migraine and will give her a migraine cocktail  Recommendations: 1) MRI brain 2) EEG 3) Compazine 10 mg and Benadryl 12.5 mg x 1  4) if the above are negative, then no further work-up is needed.   Roland Rack, MD [facility] [phone]  If 7pm- 7am, please page neurology on call as listed in [place].

## 2021-07-24 NOTE — ED Notes (Signed)
Called [facility] to transport patient to [facility] 3W room 4

## 2021-07-24 NOTE — H&P (Signed)
History and Physical   DEZAREE TRACEY ZDG:[id number] DOB: 25-Jun-1941 DOA: 07/24/2021  PCP: Unk Pinto, MD   Patient coming from: Home  Chief Complaint: Encephalopathy  HPI: ELENI FRANK is a 81 y.o. female with medical history significant of anemia, hypertension, hyperlipidemia, breast cancer, depression, CKD 3 who presents with encephalopathy/amnesia.  History obtained assistance of patient's husband and chart review due to patient's memory deficits.  Patient was last seen normal around 5 PM and was outside raking leaves.  She came to her husband around 6:45 PM and states she could not remember anything.  Husband stated this has happened once before and at that time was thought to be an amnesic event secondary to stress.  No recent stressors at this time.    States she has developed a headache and some nausea. Patient denies fevers, chills, chest pain, shortness breath, abdominal pain, constipation, diarrhea, vomiting  ED Course: Vital signs in the ED significant for blood pressure in the 951O to 841Y systolic.  Lab work-up included CMP which was normal.  CBC with leukocytosis 12.2 and platelets stably elevated at 599.  PT, PTT, INR within normal limits.  Respiratory panel for flu and COVID-negative.  Ethanol, UDS, urinalysis normal.  CT head without acute abnormality.  Neurology consulted in the ED and recommended admission and MRI and MRA of the head neck with initiation of aspirin and Plavix.  Review of Systems: As per HPI otherwise all other systems reviewed and are negative.  Past Medical History:  Diagnosis Date   Cancer of right breast ([place]) 1992   Hyperlipidemia    Hypertension    Postural hypotension 11/14/2018   Transient global amnesia 11/14/2018   Vitamin D deficiency     Past Surgical History:  Procedure Laterality Date   ABDOMINAL HYSTERECTOMY     partial   BREAST LUMPECTOMY Right    CATARACT EXTRACTION, BILATERAL Bilateral 2016   TONSILLECTOMY AND  ADENOIDECTOMY      Social History  reports that she has never smoked. She has never used smokeless tobacco. She reports that she does not drink alcohol and does not use drugs.  Allergies  Allergen Reactions   Codeine Nausea And Vomiting    "Feels like I have the flu"    Family History  Problem Relation Age of Onset   Heart attack Mother 44   Heart attack Father 64   Leukemia Brother   Reviewed on admission  Prior to Admission medications   Medication Sig Start Date End Date Taking? Authorizing Provider  Ascorbic Acid (VITAMIN C PO) Take 1 capsule by mouth daily.     [provider]  ASPIRIN PO Take 81 mg by mouth daily.    [provider]  atorvastatin (LIPITOR) 40 MG tablet Take 1 tab daily for cholesterol. 01/29/21   Liane Comber, NP  Cholecalciferol (VITAMIN D PO) Take 5,000 Units by mouth daily.     [provider]  clotrimazole-betamethasone (LOTRISONE) cream Apply to affected area 2 times daily 04/07/21 04/07/22  Magda Bernheim, NP  desonide (DESOWEN) 0.05 % cream Apply topically 2 (two) times daily. 04/07/21   Magda Bernheim, NP  lidocaine (XYLOCAINE) 5 % ointment Apply 1 application topically as needed. 04/07/21   Magda Bernheim, NP  metoprolol succinate (TOPROL-XL) 25 MG 24 hr tablet TAKE 1 TABLET DAILY FOR BLOOD PRESSURE 10/21/20   Liane Comber, NP  Omega-3 Fatty Acids (FISH OIL PO) Take 1 capsule by mouth daily.     [provider]  Probiotic Product (PROBIOTIC PO) Take by mouth daily.    [provider]    Physical Exam: Vitals:   07/24/21 2015 07/24/21 2130 07/24/21 2145 07/24/21 2232  BP: (!) 177/90 (!) 189/103 (!) 167/99 (!) 173/93  Pulse: 91 (!) 109 90 89  Resp: 14 13 20 16   Temp:    98.1 F (36.7 C)  TempSrc:    Oral  SpO2: 96% 99% 93% 96%  Weight:    59.6 kg  Height:    5\' 4"  (1.626 m)   Physical Exam Constitutional:      General: She is not in acute distress.    Appearance: Normal appearance.  HENT:      Head: Normocephalic and atraumatic.     Mouth/Throat:     Mouth: Mucous membranes are moist.     Pharynx: Oropharynx is clear.  Eyes:     Extraocular Movements: Extraocular movements intact.     Pupils: Pupils are equal, round, and reactive to light.  Cardiovascular:     Rate and Rhythm: Normal rate and regular rhythm.     Pulses: Normal pulses.     Heart sounds: Normal heart sounds.  Pulmonary:     Effort: Pulmonary effort is normal. No respiratory distress.     Breath sounds: Normal breath sounds.  Abdominal:     General: Bowel sounds are normal. There is no distension.     Palpations: Abdomen is soft.     Tenderness: There is no abdominal tenderness.  Musculoskeletal:        General: No swelling or deformity.  Skin:    General: Skin is warm and dry.  Neurological:     Comments: Mental Status: Patient is awake, alert, oriented Amnesia noted Strength and sensation grossly intact all 4 extremities No focal deficits otherwise noted   Labs on Admission: I have personally reviewed following labs and imaging studies  CBC: Recent Labs  Lab 07/24/21 1859  WBC 12.2*  NEUTROABS 4.6  HGB 14.8  HCT 45.5  MCV 88.9  PLT 599*    Basic Metabolic Panel: Recent Labs  Lab 07/24/21 1859  NA 139  K 3.8  CL 102  CO2 24  GLUCOSE 95  BUN 20  CREATININE 0.98  CALCIUM 10.0    GFR: Estimated Creatinine Clearance: 38.2 mL/min (by C-G formula based on SCr of 0.98 mg/dL).  Liver Function Tests: Recent Labs  Lab 07/24/21 1859  AST 23  ALT 19  ALKPHOS 103  BILITOT 0.7  PROT 8.1  ALBUMIN 5.0    Urine analysis:    Component Value Date/Time   COLORURINE YELLOW 07/24/2021 2050   APPEARANCEUR CLEAR 07/24/2021 2050   [place] 1.016 07/24/2021 2050   [place] 6.5 07/24/2021 2050   GLUCOSEU NEGATIVE 07/24/2021 2050   [place] NEGATIVE 07/24/2021 2050   [place] NEGATIVE 07/24/2021 2050   [place] 07/24/2021 2050   PROTEINUR NEGATIVE 07/24/2021 2050   UROBILINOGEN  0.2 12/23/2014 1033   NITRITE NEGATIVE 07/24/2021 2050   LEUKOCYTESUR NEGATIVE 07/24/2021 2050    Radiological Exams on Admission: CT HEAD CODE STROKE WO CONTRAST  Result Date: 07/24/2021 CLINICAL DATA:  Code stroke.  Encephalopathy EXAM: CT HEAD WITHOUT CONTRAST TECHNIQUE: Contiguous axial images were obtained from the base of the skull through the vertex without intravenous contrast. RADIATION DOSE REDUCTION: This exam was performed according to the departmental dose-optimization program which includes automated exposure control, adjustment of the mA and/or kV according to patient size and/or use of iterative reconstruction technique. COMPARISON:  None. FINDINGS: Brain: There is no mass, hemorrhage or extra-axial collection. The size and configuration of the ventricles and extra-axial CSF spaces are normal. The brain parenchyma is normal, without evidence of acute or chronic infarction. Vascular: No abnormal hyperdensity of the major intracranial arteries or dural venous sinuses. No intracranial atherosclerosis. Skull: The visualized skull base, calvarium and extracranial soft tissues are normal. Sinuses/Orbits: No fluid levels or advanced mucosal thickening of the visualized paranasal sinuses. No mastoid or middle ear effusion. The orbits are normal. ASPECTS [facility] Stroke Program Early CT Score) - Ganglionic level infarction (caudate, lentiform nuclei, internal capsule, insula, M1-M3 cortex): 7 - Supraganglionic infarction (M4-M6 cortex): 3 Total score (0-10 with 10 being normal): 10 IMPRESSION: 1. No acute intracranial abnormality. 2. ASPECTS is 10. These results were called by telephone at the time of interpretation on 07/24/2021 at 7:31 pm to provider Fredia Sorrow , who verbally acknowledged these results. Electronically Signed   By: Ulyses Jarred M.D.   On: 07/24/2021 19:31    EKG: Independently reviewed.  Sinus rhythm at 94 bpm.  QTc 468.  Assessment/Plan Principal Problem:   Global  amnesia Active Problems:   Hyperlipidemia   Hypertension   History of right breast cancer   CKD (chronic kidney disease) stage 3, GFR 30-59 ml/min (HCC)  Global amnesia > Patient with global starting this evening.  Reportedly had a similar episode in the past but this was in the setting of significant stressor which was believed to be the source of this amnestic event.  No current significant stressors. > Neuro exam unremarkable other than significant amnesia > Neurology consulted in ED with recommendation of MRI, MRA head neck, initiation of aspirin and Plavix, EEG. - Neurology notified of patient's arrival, appreciate recommendations - MRI brain, MRA head and neck - EEG - Daily aspirin and Plavix for now - We will allow for permissive hypertension in the setting of possible CVA - Hold off on further stroke work-up pending neuroimaging  Hyperlipidemia - Continue home atorvastatin  Hypertension - Holding home metoprolol in the setting of permissive hypertension as above  CKD 3A > Creatinine stable in the ED. - Avoid nephrotoxic agents - Trend renal function electrolytes  History of breast cancer - Noted  DVT prophylaxis: Lovenox  Code Status:   Full  Family Communication:  Husband updated at bedside Disposition Plan:   Patient is from:  Home  Anticipated DC to:  Pending clinical course  Anticipated DC date:   2 to 5 days  Anticipated DC barriers: None  Consults called:  Neurology  Admission status:  Inpatient, telemetry   Severity of Illness: The appropriate patient status for this patient is INPATIENT. Inpatient status is judged to be reasonable and necessary in order to provide the required intensity of service to ensure the patient's safety. The patient's presenting symptoms, physical exam findings, and initial radiographic and laboratory data in the context of their chronic comorbidities is felt to place them at high risk for further clinical deterioration.  Furthermore, it is not anticipated that the patient will be medically stable for discharge from the hospital within 2 midnights of admission.   * I certify that at the point of admission it is my clinical judgment that the patient will require inpatient hospital care spanning beyond 2 midnights from the point of admission due to high intensity of service, high risk for further deterioration and high frequency of surveillance required.Marcelyn Bruins MD [facility]  How to contact the [facility]  Attending or Consulting provider [place] or covering provider during after hours [place], for this patient?   Check the care team in [facility] and look for a) attending/consulting [facility] provider listed and b) the [facility] team listed Log into [email or web address] and use [place]'s universal password to access. If you do not have the password, please contact the hospital operator. Locate the [facility] provider you are looking for under [facility] and page to a number that you can be directly reached. If you still have difficulty reaching the provider, please page the [facility] (Director on Call) for the Hospitalists listed on amion for assistance.  07/24/2021, 11:18 PM

## 2021-07-24 NOTE — ED Notes (Signed)
[facility] and the Floor RN given report.

## 2021-07-24 NOTE — ED Provider Notes (Addendum)
[facility] EMERGENCY DEPT Provider Note   CSN: [id number] Arrival date & time: 07/24/21  1850  An emergency department physician performed an initial assessment on this suspected stroke patient at 23.  History  Chief Complaint  Patient presents with   Altered Mental Status    KYMIAH Vaughn is a 81 y.o. female.  Husband states that patient was fine out raking leaves acting normal at 1700.  Patient came to husband saying she could not remember anything this occurred at about 74.  Husband states that this happened 1 time before many years ago.  And they thought it was an amnestic event.  Due to stress.  Patient has a little bit of upper extremity weakness with a little bit of drift.  Lower extremities are strong.  Speech is good.  Just confused but asking appropriate questions.  Follows commands fine.  Denies any visual changes.  Acute memory change.  Code stroke will be activated.  Because of the global amnesia stroke is in the differential.  Past medical history sniffing for hyperlipidemia hypertension cancer right breast 1992, transient global amnesia in May 2020.  Patient never smoked.      Home Medications Prior to Admission medications   Medication Sig Start Date End Date Taking? Authorizing Provider  Ascorbic Acid (VITAMIN C PO) Take 1 capsule by mouth daily.     [provider]  ASPIRIN PO Take 81 mg by mouth daily.    [provider]  atorvastatin (LIPITOR) 40 MG tablet Take 1 tab daily for cholesterol. 01/29/21   Liane Comber, NP  Cholecalciferol (VITAMIN D PO) Take 5,000 Units by mouth daily.     [provider]  clotrimazole-betamethasone (LOTRISONE) cream Apply to affected area 2 times daily 04/07/21 04/07/22  Magda Bernheim, NP  desonide (DESOWEN) 0.05 % cream Apply topically 2 (two) times daily. 04/07/21   Magda Bernheim, NP  lidocaine (XYLOCAINE) 5 % ointment Apply 1 application topically as needed. 04/07/21   Magda Bernheim,  NP  metoprolol succinate (TOPROL-XL) 25 MG 24 hr tablet TAKE 1 TABLET DAILY FOR BLOOD PRESSURE 10/21/20   Liane Comber, NP  Omega-3 Fatty Acids (FISH OIL PO) Take 1 capsule by mouth daily.     [provider]  Probiotic Product (PROBIOTIC PO) Take by mouth daily.    [provider]      Allergies    Codeine    Review of Systems   Review of Systems  Unable to perform ROS: Mental status change   Physical Exam Updated Vital Signs BP (!) 190/110    Pulse (!) 122    Temp 97.9 F (36.6 C)    Resp 20    Wt 59.9 kg    SpO2 100%    BMI 22.67 kg/m  Physical Exam Vitals and nursing note reviewed.  Constitutional:      General: She is not in acute distress.    Appearance: Normal appearance. She is well-developed.  HENT:     Head: Normocephalic and atraumatic.  Eyes:     Extraocular Movements: Extraocular movements intact.     Conjunctiva/sclera: Conjunctivae normal.     Pupils: Pupils are equal, round, and reactive to light.  Cardiovascular:     Rate and Rhythm: Normal rate and regular rhythm.     Heart sounds: No murmur heard. Pulmonary:     Effort: Pulmonary effort is normal. No respiratory distress.     Breath sounds: Normal breath sounds.  Abdominal:  Palpations: Abdomen is soft.     Tenderness: There is no abdominal tenderness.  Musculoskeletal:        General: No swelling.     Cervical back: Normal range of motion and neck supple.  Skin:    General: Skin is warm and dry.     Capillary Refill: Capillary refill takes less than 2 seconds.  Neurological:     Mental Status: She is alert.     Cranial Nerves: No cranial nerve deficit.     Motor: Weakness present.     Comments: Patient with significant memory deficit.  Some upper extremity weakness.  Bilaterally.  Lower extremity good strength.  Vision grossly intact.  Speech normal.  Psychiatric:        Mood and Affect: Mood normal.    ED Results / Procedures / Treatments   Labs (all labs ordered are  listed, but only abnormal results are displayed) Labs Reviewed  CBC - Abnormal; Notable for the following components:      Result Value   WBC 12.2 (*)    RBC 5.12 (*)    Platelets 599 (*)    All other components within normal limits  COMPREHENSIVE METABOLIC PANEL - Abnormal; Notable for the following components:   GFR, Estimated 58 (*)    All other components within normal limits  RESP PANEL BY RT-PCR (FLU A&B, COVID) ARPGX2  PROTIME-INR  APTT  ETHANOL  DIFFERENTIAL  RAPID URINE DRUG SCREEN, HOSP PERFORMED  URINALYSIS, ROUTINE W REFLEX MICROSCOPIC  CBG MONITORING, ED  CBG MONITORING, ED    EKG None  Radiology CT HEAD CODE STROKE WO CONTRAST  Result Date: 07/24/2021 CLINICAL DATA:  Code stroke.  Encephalopathy EXAM: CT HEAD WITHOUT CONTRAST TECHNIQUE: Contiguous axial images were obtained from the base of the skull through the vertex without intravenous contrast. RADIATION DOSE REDUCTION: This exam was performed according to the departmental dose-optimization program which includes automated exposure control, adjustment of the mA and/or kV according to patient size and/or use of iterative reconstruction technique. COMPARISON:  None. FINDINGS: Brain: There is no mass, hemorrhage or extra-axial collection. The size and configuration of the ventricles and extra-axial CSF spaces are normal. The brain parenchyma is normal, without evidence of acute or chronic infarction. Vascular: No abnormal hyperdensity of the major intracranial arteries or dural venous sinuses. No intracranial atherosclerosis. Skull: The visualized skull base, calvarium and extracranial soft tissues are normal. Sinuses/Orbits: No fluid levels or advanced mucosal thickening of the visualized paranasal sinuses. No mastoid or middle ear effusion. The orbits are normal. ASPECTS [facility] Stroke Program Early CT Score) - Ganglionic level infarction (caudate, lentiform nuclei, internal capsule, insula, M1-M3 cortex): 7 -  Supraganglionic infarction (M4-M6 cortex): 3 Total score (0-10 with 10 being normal): 10 IMPRESSION: 1. No acute intracranial abnormality. 2. ASPECTS is 10. These results were called by telephone at the time of interpretation on 07/24/2021 at 7:31 pm to provider Fredia Sorrow , who verbally acknowledged these results. Electronically Signed   By: Ulyses Jarred M.D.   On: 07/24/2021 19:31    Procedures Procedures    Medications Ordered in ED Medications  clopidogrel (PLAVIX) tablet 300 mg (has no administration in time range)  sodium chloride flush (NS) 0.9 % injection 3 mL (3 mLs Intravenous Given 07/24/21 1915)    ED Course/ Medical Decision Making/ A&P                           Medical Decision Making  Amount and/or Complexity of Data Reviewed Labs: ordered. Radiology: ordered.  Risk Prescription drug management. Decision regarding hospitalization.   CRITICAL CARE Performed by: Fredia Sorrow Total critical care time: 45 minutes Critical care time was exclusive of separately billable procedures and treating other patients. Critical care was necessary to treat or prevent imminent or life-threatening deterioration. Critical care was time spent personally by me on the following activities: development of treatment plan with patient and/or surrogate as well as nursing, discussions with consultants, evaluation of patient's response to treatment, examination of patient, obtaining history from patient or surrogate, ordering and performing treatments and interventions, ordering and review of laboratory studies, ordering and review of radiographic studies, pulse oximetry and re-evaluation of patient's condition.  Patient presenting with global amnesia.  Code stroke activated.  Differential includes stroke.  Patient probably not a tPA candidate just has a little bit of upper extremity weakness.  Head CT without any acute findings.  Labs here COVID testing still pending.  Blood alcohol level  is less than 10 glucose 97 INR normal.  CBC White blood cell count 12.2 hemoglobin 36.4 complete metabolic panel without any acute findings other than GFR 58.  Evaluated by telemetry neurologist.  The telemetry neurologist was Jonelle Sidle.  And they are recommending admission.  To include EEG MRI, MRA head neck.  Will contact hospitalist for admission.   Contacted Dr. Leonel Ramsay on-call for neurology at [facility].  We will contact the hospitalist for admission Dr. Saralyn Pilar will see patient when she arrives   Final Clinical Impression(s) / ED Diagnoses Final diagnoses:  Global amnesia    Rx / DC Orders ED Discharge Orders     None         Fredia Sorrow, MD 07/24/21 Kathyrn Drown    Fredia Sorrow, MD 07/24/21 Jeananne Rama    Fredia Sorrow, MD 07/24/21 1954    Fredia Sorrow, MD 07/24/21 Karl Bales    Fredia Sorrow, MD 07/24/21 2009

## 2021-07-25 ENCOUNTER — Inpatient Hospital Stay (HOSPITAL_COMMUNITY): Payer: Medicare HMO

## 2021-07-25 DIAGNOSIS — R569 Unspecified convulsions: Secondary | ICD-10-CM

## 2021-07-25 DIAGNOSIS — R413 Other amnesia: Secondary | ICD-10-CM | POA: Diagnosis not present

## 2021-07-25 DIAGNOSIS — R404 Transient alteration of awareness: Secondary | ICD-10-CM | POA: Diagnosis not present

## 2021-07-25 DIAGNOSIS — I6389 Other cerebral infarction: Secondary | ICD-10-CM

## 2021-07-25 DIAGNOSIS — G9340 Encephalopathy, unspecified: Secondary | ICD-10-CM | POA: Diagnosis not present

## 2021-07-25 LAB — ECHOCARDIOGRAM COMPLETE
AR max vel: 1.68 cm2
AV Area VTI: 1.76 cm2
AV Area mean vel: 1.74 cm2
AV Mean grad: 3.9 mmHg
AV Peak grad: 7.8 mmHg
Ao pk vel: 1.4 m/s
Area-P 1/2: 2.54 cm2
Height: 64 in
S' Lateral: 3.4 cm
Weight: 2102.31 oz

## 2021-07-25 MED ORDER — ATORVASTATIN CALCIUM 80 MG PO TABS
80.0000 mg | ORAL_TABLET | Freq: Every day | ORAL | Status: DC
  Administered 2021-07-26: 80 mg via ORAL
  Filled 2021-07-25: qty 1

## 2021-07-25 MED ORDER — LABETALOL HCL 5 MG/ML IV SOLN: 10.0000 mg | INTRAVENOUS | Status: DC | PRN

## 2021-07-25 MED ORDER — PROCHLORPERAZINE EDISYLATE 10 MG/2ML IJ SOLN: 10.0000 mg | Freq: Once | INTRAMUSCULAR | Status: DC

## 2021-07-25 MED ORDER — DIPHENHYDRAMINE HCL 50 MG/ML IJ SOLN: 12.5000 mg | Freq: Once | INTRAMUSCULAR | Status: DC

## 2021-07-25 NOTE — Progress Notes (Signed)
°  Echocardiogram 2D Echocardiogram has been performed.  Tiffany Vaughn 07/25/2021, 4:37 PM
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
## 2021-07-25 NOTE — Progress Notes (Signed)
Received from [facility] ED via ambulance service.  Oriented to room and surroundings.  No complaints.  Stroke education started.

## 2021-07-25 NOTE — Evaluation (Addendum)
Physical Therapy Evaluation Patient Details Name: Tiffany Vaughn MRN: [id number] DOB: 16-May-1941 Today's Date: 07/25/2021  History of Present Illness  Tiffany Vaughn is a 81 y.o. female who presented to the ED with amnesia due to encephalopathy. PMH medical history significant of anemia, hypertension, hyperlipidemia, breast cancer, depression, CKD 3,amnesia.   Clinical Impression  Pt was able to ambulate independently in the hall way and on stairs. She had a 24/24 on the DGI balance assessment. Pt reported that she was feeling light fatigue during evaluation but attributed it more to being in bed so much the past couple days. She was educated on graded return to full activity. Pt does not need physical therapy in acute setting or post acute setting.   Recommendations for follow up therapy are one component of a multi-disciplinary discharge planning process, led by the attending physician.  Recommendations may be updated based on patient status, additional functional criteria and insurance authorization.  Follow Up Recommendations No PT follow up    Assistance Recommended at Discharge None  Patient can return home with the following       Equipment Recommendations None recommended by PT  Recommendations for Other Services       Functional Status Assessment Patient has not had a recent decline in their functional status     Precautions / Restrictions Restrictions Weight Bearing Restrictions: No      Mobility  Bed Mobility Overal bed mobility: Independent                  Transfers Overall transfer level: Independent Equipment used: None                    Ambulation/Gait Ambulation/Gait assistance: Independent Gait Distance (Feet): 250 Feet   Gait Pattern/deviations: WFL(Within Functional Limits) Gait velocity: normal        Stairs Stairs: Yes Stairs assistance: Independent Stair Management: No rails, Alternating pattern Number of Stairs:  4    Wheelchair Mobility    Modified Rankin (Stroke Patients Only)       Balance Overall balance assessment: Independent                               Standardized Balance Assessment Standardized Balance Assessment : Dynamic Gait Index   Dynamic Gait Index Level Surface: Normal Change in Gait Speed: Normal Gait with Horizontal Head Turns: Normal Gait with Vertical Head Turns: Normal Gait and Pivot Turn: Normal Step Over Obstacle: Normal Step Around Obstacles: Normal Steps: Normal Total Score: 24       Pertinent Vitals/Pain Pain Assessment Pain Assessment: No/denies pain    Home Living Family/patient expects to be discharged to:: Private residence Living Arrangements: Spouse/significant other Available Help at Discharge: Family (husband and son) Type of Home: House Home Access: Stairs to enter Entrance Stairs-Rails: None Technical brewer of Steps: 2   Home Layout: Two level Home Equipment: Civil engineer, contracting      Prior Function Prior Level of Function : Independent/Modified Independent                     Hand Dominance   Dominant Hand: Right    Extremity/Trunk Assessment        Lower Extremity Assessment Lower Extremity Assessment: Overall WFL for tasks assessed       Communication   Communication: No difficulties  Cognition Arousal/Alertness: Awake/alert Behavior During Therapy: WFL for tasks assessed/performed Overall Cognitive Status: Within Functional  Limits for tasks assessed                                          General Comments General comments (skin integrity, edema, etc.): Discussed activity modifications to minimized stroke risk factors    Exercises     Assessment/Plan    PT Assessment Patient does not need any further PT services  PT Problem List         PT Treatment Interventions      PT Goals (Current goals can be found in the Care Plan section)       Frequency        Co-evaluation               AM-PAC PT "6 Clicks" Mobility  Outcome Measure Help needed turning from your back to your side while in a flat bed without using bedrails?: None Help needed moving from lying on your back to sitting on the side of a flat bed without using bedrails?: None Help needed moving to and from a bed to a chair (including a wheelchair)?: None Help needed standing up from a chair using your arms (e.g., wheelchair or bedside chair)?: None Help needed to walk in hospital room?: None Help needed climbing 3-5 steps with a railing? : None 6 Click Score: 24    End of Session Equipment Utilized During Treatment: Gait belt Activity Tolerance: Patient tolerated treatment well Patient left: in bed;with bed alarm set;with call bell/phone within reach   PT Visit Diagnosis: Other symptoms and signs involving the nervous system (R29.898)    Time: 1425-1450 PT Time Calculation (min) (ACUTE ONLY): 25 min   Charges:   PT Evaluation $PT Eval Low Complexity: 1 Low          Quenton Fetter, SPT   Quenton Fetter 07/25/2021, 3:49 PM

## 2021-07-25 NOTE — Progress Notes (Addendum)
PROGRESS NOTE    Tiffany Vaughn  JKD:[id number] DOB: Mar 08, 1941 DOA: 07/24/2021 PCP: Unk Pinto, MD   Chief Complain: Encephalopathy  Brief Narrative:  Patient is a 81 year old female with history of anemia, hypertension, hyperlipidemia, breast cancer, depression, CKD stage III who presented with memory loss, encephalopathy.  This has happened for the second time.She was admitted for the same in 2020 and was thought to have transient global amnesia.  Work-up was negative at the time.  On presentation her blood pressure was high, lab work showed mild leukocytosis.  Ethanol level, UDS, UA not impressive.  CT head without any acute abnormality.  Neurology consulted.  EEG did not show any seizure or epileptiform discharge.  MRI of the brain showed possible tiny acute infarct in the left centrum semiovale.   Assessment & Plan:   Principal Problem:   Global amnesia Active Problems:   Hyperlipidemia   Hypertension   History of right breast cancer   CKD (chronic kidney disease) stage 3, GFR 30-59 ml/min (HCC)   Global amnesia: Had a similar episode in the past.  Patient became confused at home and lost her memory.  Neurological examination was unremarkable other than amnesia.  Admitted for the suspicion of stroke.  Stroke work-up initiated. CT head did not show any acute intracranial normalities.  EEG did not show any seizure or epileptiform discharge.  MRI of the brain showed tiny acute infarct in the left centrum semiovale,likely an artifact as per neurology. .She has been started on aspirin and Plavix empirically for now, will follow up with neurology if we need to continue this.  PT/OT consulted.  Echocardiogram has been ordered. Recent LDL of 117.  Recent A1c of 6.2. Her memory has completely recovered and she is currently alert and oriented without any focal deficits  Hyperlipidemia: Recent LDL of 117.  Takes Lipitor at home which we will continue  Hypertension: Currently  blood pressure stable.  Takes metoprolol at home, on hold for allowing permissive hypertension.  Continue as needed medications for severe hypertension  History of CKD stage IIIa: Currently kidney function at baseline or better.  History of breast cancer: Currently in remission         DVT prophylaxis: Lovenox Code Status: Full code Family Communication: Husband at bedside Patient status: Inpatient  Dispo: The patient is from: Home              Anticipated d/c is to: Home              Anticipated d/c date is: tomorrow  Consultants: Neurology  Procedures: None  Antimicrobials:  Anti-infectives (From admission, onward)    None       Subjective: Patient seen and examined at the bedside this morning.  Hemodynamically stable.  She was just done with EEG.  Husband at the bedside.  She is completely alert and oriented.  Memory is normal.  No focal neurodeficits  Objective: Vitals:   07/24/21 2145 07/24/21 2232 07/25/21 0100 07/25/21 0354  BP: (!) 167/99 (!) 173/93 (!) 168/72 128/80  Pulse: 90 89 86 78  Resp: 20 16 18 17   Temp:  98.1 F (36.7 C) 98.2 F (36.8 C) 98.2 F (36.8 C)  TempSrc:  Oral Oral Oral  SpO2: 93% 96%  93%  Weight:  59.6 kg    Height:  5\' 4"  (1.626 m)     No intake or output data in the 24 hours ending 07/25/21 0738 Filed Weights   07/24/21 1909 07/24/21 2232  Weight:  59.9 kg 59.6 kg    Examination:  General exam: Overall comfortable, not in distress,pleasant elderly female HEENT: PERRL Respiratory system:  no wheezes or crackles  Cardiovascular system: S1 & S2 heard, RRR.  Gastrointestinal system: Abdomen is nondistended, soft and nontender. Central nervous system: Alert and oriented Extremities: No edema, no clubbing ,no cyanosis Skin: No rashes, no ulcers,no icterus      Data Reviewed: I have personally reviewed following labs and imaging studies  CBC: Recent Labs  Lab 07/24/21 1859  WBC 12.2*  NEUTROABS 4.6  HGB 14.8  HCT  45.5  MCV 88.9  PLT 008*   Basic Metabolic Panel: Recent Labs  Lab 07/24/21 1859  NA 139  K 3.8  CL 102  CO2 24  GLUCOSE 95  BUN 20  CREATININE 0.98  CALCIUM 10.0   GFR: Estimated Creatinine Clearance: 38.2 mL/min (by C-G formula based on SCr of 0.98 mg/dL). Liver Function Tests: Recent Labs  Lab 07/24/21 1859  AST 23  ALT 19  ALKPHOS 103  BILITOT 0.7  PROT 8.1  ALBUMIN 5.0   No results for input(s): LIPASE, AMYLASE in the last 168 hours. No results for input(s): AMMONIA in the last 168 hours. Coagulation Profile: Recent Labs  Lab 07/24/21 1859  INR 0.9   Cardiac Enzymes: No results for input(s): CKTOTAL, CKMB, CKMBINDEX, TROPONINI in the last 168 hours. BNP (last 3 results) No results for input(s): PROBNP in the last 8760 hours. HbA1C: No results for input(s): HGBA1C in the last 72 hours. CBG: Recent Labs  Lab 07/24/21 1857 07/24/21 1934  GLUCAP 91 97   Lipid Profile: No results for input(s): CHOL, HDL, LDLCALC, TRIG, CHOLHDL, LDLDIRECT in the last 72 hours. Thyroid Function Tests: No results for input(s): TSH, T4TOTAL, FREET4, T3FREE, THYROIDAB in the last 72 hours. Anemia Panel: No results for input(s): VITAMINB12, FOLATE, FERRITIN, TIBC, IRON, RETICCTPCT in the last 72 hours. Sepsis Labs: No results for input(s): PROCALCITON, LATICACIDVEN in the last 168 hours.  Recent Results (from the past 240 hour(s))  Resp Panel by RT-PCR (Flu A&B, Covid) Nasopharyngeal Swab     Status: None   Collection Time: 07/24/21  7:44 PM   Specimen: Nasopharyngeal Swab; Nasopharyngeal(NP) swabs in vial transport medium  Result Value Ref Range Status   SARS Coronavirus 2 by RT PCR NEGATIVE NEGATIVE Final    Comment: (NOTE) SARS-CoV-2 target nucleic acids are NOT DETECTED.  The SARS-CoV-2 RNA is generally detectable in upper respiratory specimens during the acute phase of infection. The lowest concentration of SARS-CoV-2 viral copies this assay can detect is 138  copies/mL. A negative result does not preclude SARS-Cov-2 infection and should not be used as the sole basis for treatment or other patient management decisions. A negative result may occur with  improper specimen collection/handling, submission of specimen other than nasopharyngeal swab, presence of viral mutation(s) within the areas targeted by this assay, and inadequate number of viral copies(<138 copies/mL). A negative result must be combined with clinical observations, patient history, and epidemiological information. The expected result is Negative.  Fact Sheet for Patients:  EntrepreneurPulse.com.au  Fact Sheet for Healthcare Providers:  IncredibleEmployment.be  This test is no t yet approved or cleared by the [place] FDA and  has been authorized for detection and/or diagnosis of SARS-CoV-2 by FDA under an Emergency Use Authorization (EUA). This EUA will remain  in effect (meaning this test can be used) for the duration of the COVID-19 declaration under Section 564(b)(1) of the Act, 21 U.S.C.section 360bbb-3(b)(1),  unless the authorization is terminated  or revoked sooner.       Influenza A by PCR NEGATIVE NEGATIVE Final   Influenza B by PCR NEGATIVE NEGATIVE Final    Comment: (NOTE) The Xpert Xpress SARS-CoV-2/FLU/RSV plus assay is intended as an aid in the diagnosis of influenza from Nasopharyngeal swab specimens and should not be used as a sole basis for treatment. Nasal washings and aspirates are unacceptable for Xpert Xpress SARS-CoV-2/FLU/RSV testing.  Fact Sheet for Patients: EntrepreneurPulse.com.au  Fact Sheet for Healthcare Providers: IncredibleEmployment.be  This test is not yet approved or cleared by the [place] FDA and has been authorized for detection and/or diagnosis of SARS-CoV-2 by FDA under an Emergency Use Authorization (EUA). This EUA will remain in effect (meaning  this test can be used) for the duration of the COVID-19 declaration under Section 564(b)(1) of the Act, 21 U.S.C. section 360bbb-3(b)(1), unless the authorization is terminated or revoked.  Performed at [facility], [address] [place], [place] [address]          Radiology Studies: CT HEAD CODE STROKE WO CONTRAST  Result Date: 07/24/2021 CLINICAL DATA:  Code stroke.  Encephalopathy EXAM: CT HEAD WITHOUT CONTRAST TECHNIQUE: Contiguous axial images were obtained from the base of the skull through the vertex without intravenous contrast. RADIATION DOSE REDUCTION: This exam was performed according to the departmental dose-optimization program which includes automated exposure control, adjustment of the mA and/or kV according to patient size and/or use of iterative reconstruction technique. COMPARISON:  None. FINDINGS: Brain: There is no mass, hemorrhage or extra-axial collection. The size and configuration of the ventricles and extra-axial CSF spaces are normal. The brain parenchyma is normal, without evidence of acute or chronic infarction. Vascular: No abnormal hyperdensity of the major intracranial arteries or dural venous sinuses. No intracranial atherosclerosis. Skull: The visualized skull base, calvarium and extracranial soft tissues are normal. Sinuses/Orbits: No fluid levels or advanced mucosal thickening of the visualized paranasal sinuses. No mastoid or middle ear effusion. The orbits are normal. ASPECTS [facility] Stroke Program Early CT Score) - Ganglionic level infarction (caudate, lentiform nuclei, internal capsule, insula, M1-M3 cortex): 7 - Supraganglionic infarction (M4-M6 cortex): 3 Total score (0-10 with 10 being normal): 10 IMPRESSION: 1. No acute intracranial abnormality. 2. ASPECTS is 10. These results were called by telephone at the time of interpretation on 07/24/2021 at 7:31 pm to provider Fredia Sorrow , who verbally acknowledged these results.  Electronically Signed   By: Ulyses Jarred M.D.   On: 07/24/2021 19:31        Scheduled Meds:  aspirin EC  81 mg Oral Daily   atorvastatin  40 mg Oral Daily   clopidogrel  75 mg Oral Daily   diphenhydrAMINE  12.5 mg Intravenous Once   enoxaparin (LOVENOX) injection  40 mg Subcutaneous Daily   prochlorperazine  10 mg Intravenous Once   Continuous Infusions:   LOS: 1 day         Shelly Coss, MD [facility] P1/28/2023, 7:38 AM

## 2021-07-25 NOTE — Procedures (Signed)
Patient Name: Tiffany Vaughn  MRN: [id number]  Epilepsy Attending: Lora Havens  Referring Physician/Provider: Marcelyn Bruins, MD Date: 07/25/2021 Duration: 21.31 mins  Patient history: 81 year old female with what is likely recurrent transient global amnesia. EEG to evaluate for seizure.  Level of alertness: Awake  AEDs during EEG study: None  Technical aspects: This EEG study was done with scalp electrodes positioned according to the 10-20 International system of electrode placement. Electrical activity was acquired at a sampling rate of 500Hz  and reviewed with a high frequency filter of 70Hz  and a low frequency filter of 1Hz . EEG data were recorded continuously and digitally stored.   Description: The posterior dominant rhythm consists of 9.5 Hz activity of moderate voltage (25-35 uV) seen predominantly in posterior head regions, symmetric and reactive to eye opening and eye closing. Hyperventilation did not show any EEG change. Physiologic photic driving was not seen during photic stimulation.    IMPRESSION: This study is within normal limits. No seizures or epileptiform discharges were seen throughout the recording.  Tayo Maute Barbra Sarks

## 2021-07-25 NOTE — Progress Notes (Signed)
EEG done at bedside. No skin breakdown noted. Results pending. 

## 2021-07-26 ENCOUNTER — Inpatient Hospital Stay (HOSPITAL_COMMUNITY): Payer: Medicare HMO

## 2021-07-26 DIAGNOSIS — R413 Other amnesia: Secondary | ICD-10-CM | POA: Diagnosis not present

## 2021-07-26 DIAGNOSIS — I639 Cerebral infarction, unspecified: Secondary | ICD-10-CM

## 2021-07-26 DIAGNOSIS — G9340 Encephalopathy, unspecified: Secondary | ICD-10-CM | POA: Diagnosis not present

## 2021-07-26 LAB — CBC WITH DIFFERENTIAL/PLATELET
Abs Immature Granulocytes: 0.01 10*3/uL (ref 0.00–0.07)
Basophils Absolute: 0 10*3/uL (ref 0.0–0.1)
Basophils Relative: 0 %
Eosinophils Absolute: 0.2 10*3/uL (ref 0.0–0.5)
Eosinophils Relative: 3 %
HCT: 44.8 % (ref 36.0–46.0)
Hemoglobin: 14.3 g/dL (ref 12.0–15.0)
Immature Granulocytes: 0 %
Lymphocytes Relative: 30 %
Lymphs Abs: 2.3 10*3/uL (ref 0.7–4.0)
MCH: 28.7 pg (ref 26.0–34.0)
MCHC: 31.9 g/dL (ref 30.0–36.0)
MCV: 89.8 fL (ref 80.0–100.0)
Monocytes Absolute: 0.9 10*3/uL (ref 0.1–1.0)
Monocytes Relative: 11 %
Neutro Abs: 4.3 10*3/uL (ref 1.7–7.7)
Neutrophils Relative %: 56 %
Platelets: 516 10*3/uL — ABNORMAL HIGH (ref 150–400)
RBC: 4.99 MIL/uL (ref 3.87–5.11)
RDW: 14.3 % (ref 11.5–15.5)
WBC: 7.8 10*3/uL (ref 4.0–10.5)
nRBC: 0 % (ref 0.0–0.2)

## 2021-07-26 LAB — VITAMIN B12: Vitamin B-12: 137 pg/mL — ABNORMAL LOW (ref 180–914)

## 2021-07-26 LAB — FOLATE: Folate: 12.6 ng/mL (ref >5.9)

## 2021-07-26 MED ORDER — CLOPIDOGREL BISULFATE 75 MG PO TABS: 75.0000 mg | ORAL_TABLET | Freq: Every day | ORAL | 1 refills | Status: DC

## 2021-07-26 MED ORDER — ASPIRIN 81 MG PO TBEC: 81.0000 mg | DELAYED_RELEASE_TABLET | Freq: Every day | ORAL | 0 refills | Status: AC

## 2021-07-26 MED ORDER — CYANOCOBALAMIN 1000 MCG/ML IJ SOLN
1000.0000 ug | Freq: Once | INTRAMUSCULAR | Status: AC
  Administered 2021-07-26: 1000 ug via INTRAMUSCULAR
  Filled 2021-07-26: qty 1

## 2021-07-26 MED ORDER — VITAMIN B-12 1000 MCG PO TABS: 1000.0000 ug | ORAL_TABLET | Freq: Every day | ORAL | Status: DC

## 2021-07-26 MED ORDER — CYANOCOBALAMIN 1000 MCG PO TABS: 1000.0000 ug | ORAL_TABLET | Freq: Every day | ORAL | 1 refills | Status: DC

## 2021-07-26 MED ORDER — ATORVASTATIN CALCIUM 40 MG PO TABS: 40.0000 mg | ORAL_TABLET | Freq: Every day | ORAL | Status: DC
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
## 2021-07-26 NOTE — Progress Notes (Signed)
Carotid artery duplex has been completed. Preliminary results can be found in CV Proc through chart review.   07/26/21 11:30 AM Tiffany Vaughn RVT

## 2021-07-26 NOTE — Progress Notes (Signed)
OT Cancellation Note  Patient Details Name: AAYRA HORNBAKER MRN: [id number] DOB: 03-Jun-1941   Cancelled Treatment:    Reason Eval/Treat Not Completed: OT screened, no needs identified, will sign off (Pt with active d/c orders to home, per chart she is at her indep baseline. OT will screen and sign off, thank you.)  Aldona Bar A Saket Hellstrom 07/26/2021, 10:56 AM

## 2021-07-26 NOTE — Discharge Summary (Signed)
Physician Discharge Summary  Tiffany Vaughn YYT:[id number] DOB: 22-Jun-1941 DOA: 07/24/2021  PCP: Unk Pinto, MD  Admit date: 07/24/2021 Discharge date: 07/26/2021  Admitted From: Home Disposition:  Home  Discharge Condition:Stable CODE STATUS:FULL Diet recommendation: Heart Healthy    Brief/Interim Summary:    Discharge Diagnoses:  Patient is a 81 year old female with history of anemia, hypertension, hyperlipidemia, breast cancer, depression, CKD stage III who presented with memory loss, encephalopathy.  This has happened for the second time.She was admitted for the same in 2020 and was thought to have transient global amnesia.  Work-up was negative at the time.  On presentation her blood pressure was high, lab work showed mild leukocytosis.  Ethanol level, UDS, UA not impressive.  CT head without any acute abnormality.  Neurology consulted.  EEG did not show any seizure or epileptiform discharge.  MRI of the brain showed possible tiny acute infarct in the left centrum semiovale, likely an artifact.  PT/OT did not recommend any follow-up.  She is medically stable for discharge to home today, neurology cleared for discharge.  Following problems were addressed during her hospitalization:  Global amnesia: Had a similar episode in the past.  Patient became confused at home and lost her memory.  Neurological examination was unremarkable other than amnesia.  Admitted for the suspicion of stroke.  Stroke work-up initiated. CT head did not show any acute intracranial normalities.  EEG did not show any seizure or epileptiform discharge.  MRI of the brain showed tiny acute infarct in the left centrum semiovale,likely an artifact as per neurology. .She has been started on aspirin and Plavix .  Plan is to continue both for 3 weeks followed by Plavix alone.  PT/OT consulted, no follow-up recommended.  Echocardiogram showed EF of 60 to 27%, grade 1 diastolic dysfunction, no shunt.   Recent LDL of  117.  Recent A1c of 6.2. Her memory has completely recovered and she is currently alert and oriented without any focal deficits. She will follow-up with neurology as an outpatient.  Carotid Doppler to be done as an outpatient   Hyperlipidemia: Recent LDL of 117.  Takes Lipitor at home which we will continue   Hypertension: Currently blood pressure stable.  Takes metoprolol at home   History of CKD stage IIIa: Currently kidney function at baseline or better.   History of breast cancer: Currently in remission  Vitamin B12 deficiency: Given a dose of intramuscular vitamin B-12.  Continue oral supplementation       Discharge Instructions  Discharge Instructions     Ambulatory referral to Neurology   Complete by: As directed    An appointment is requested in approximately: 3 weeks   Diet - low sodium heart healthy   Complete by: As directed    Discharge instructions   Complete by: As directed    1)Please take prescribed medications as instructed 2)Follow up with neurology as an outpatient in 3 weeks.  Name and number of the provider group has been attached 3) follow-up with your PCP in 1 week.Monitor your blood pressure at home   Increase activity slowly   Complete by: As directed       Allergies as of 07/26/2021       Reactions   Codeine Nausea And Vomiting   "Feels like I have the flu"        Medication List     TAKE these medications    aspirin 81 MG EC tablet Take 1 tablet (81 mg total) by mouth daily  for 19 days. Swallow whole. Start taking on: July 27, 2021 What changed:  medication strength when to take this additional instructions   atorvastatin 40 MG tablet Commonly known as: LIPITOR Take 1 tab daily for cholesterol. What changed:  how much to take how to take this when to take this additional instructions   clopidogrel 75 MG tablet Commonly known as: PLAVIX Take 1 tablet (75 mg total) by mouth daily. Start taking on: July 27, 2021    cyanocobalamin 1000 MCG tablet Take 1 tablet (1,000 mcg total) by mouth daily. Start taking on: July 27, 2021   desonide 0.05 % cream Commonly known as: DesOwen Apply topically 2 (two) times daily. What changed:  how much to take when to take this reasons to take this   FISH OIL PO Take 1,000 mg by mouth daily.   metoprolol succinate 25 MG 24 hr tablet Commonly known as: TOPROL-XL TAKE 1 TABLET DAILY FOR BLOOD PRESSURE What changed:  how much to take when to take this additional instructions   PROBIOTIC PO Take 250 mg by mouth daily.   VITAMIN C PO Take 500 mg by mouth daily.   VITAMIN D PO Take 5,000 Units by mouth daily.        Follow-up Information     Unk Pinto, MD. Schedule an appointment as soon as possible for a visit in 1 week(s).   Specialty: Internal Medicine Contact information: [address] [place] [address] [phone]         [facility]. Schedule an appointment as soon as possible for a visit in 3 week(s).   Specialty: Neurology Contact information: [place] [address] [phone]               Allergies  Allergen Reactions   Codeine Nausea And Vomiting    "Feels like I have the flu"    Consultations: neurology   Procedures/Studies: MR BRAIN WO CONTRAST  Result Date: 07/25/2021 CLINICAL DATA:  Neuro deficit, stroke suspected EXAM: MRI HEAD WITHOUT CONTRAST TECHNIQUE: Multiplanar, multiecho pulse sequences of the brain and surrounding structures were obtained without intravenous contrast. COMPARISON:  Noncontrast CT head dated 1 day prior, brain MRI 11/14/2018 FINDINGS: Brain: There is a punctate focus of diffusion restriction in the left centrum semiovale with associated low ADC signal (2-42). There is no other evidence of acute infarct. There is no evidence of acute intracranial hemorrhage or extra-axial fluid collection. Parenchymal  volume is normal. The ventricles are normal in size. Is minimal for age FLAIR signal abnormality in the subcortical and periventricular white matter. There is no suspicious parenchymal signal abnormality. There is no mass lesion.  There is no midline shift. Vascular: Normal flow voids. Skull and upper cervical spine: Normal marrow signal. Sinuses/Orbits: The paranasal sinuses are clear. Bilateral lens implants are in place. The globes and orbits are otherwise unremarkable. Other: None. IMPRESSION: Possible tiny acute infarct in the left centrum semiovale. Otherwise, normal for age brain MRI. Electronically Signed   By: Valetta Mole M.D.   On: 07/25/2021 13:06   EEG adult  Result Date: 07/25/2021 Lora Havens, MD     07/25/2021 10:35 AM Patient Name: Tiffany Vaughn MRN: [id number] Epilepsy Attending: Lora Havens Referring Physician/Provider: Marcelyn Bruins, MD Date: 07/25/2021 Duration: 21.31 mins Patient history: 81 year old female with what is likely recurrent transient global amnesia. EEG to evaluate for seizure. Level of alertness: Awake AEDs during EEG study: None Technical aspects:  This EEG study was done with scalp electrodes positioned according to the 10-20 International system of electrode placement. Electrical activity was acquired at a sampling rate of 500Hz  and reviewed with a high frequency filter of 70Hz  and a low frequency filter of 1Hz . EEG data were recorded continuously and digitally stored. Description: The posterior dominant rhythm consists of 9.5 Hz activity of moderate voltage (25-35 uV) seen predominantly in posterior head regions, symmetric and reactive to eye opening and eye closing. Hyperventilation did not show any EEG change. Physiologic photic driving was not seen during photic stimulation.  IMPRESSION: This study is within normal limits. No seizures or epileptiform discharges were seen throughout the recording. Lora Havens   ECHOCARDIOGRAM COMPLETE  Result  Date: 07/25/2021    ECHOCARDIOGRAM REPORT   Patient Name:   SYRITA DOVEL Date of Exam: 07/25/2021 Medical Rec #:  [id number]         Height:       64.0 in Accession #:    [id number]        Weight:       131.4 lb Date of Birth:  1941-03-12        BSA:          1.636 m Patient Age:    81 years          BP:           157/80 mmHg Patient Gender: F                 HR:           66 bpm. Exam Location:  Inpatient Procedure: 2D Echo Indications:    stroke  History:        Patient has no prior history of Echocardiogram examinations.                 Chronic kidney disease; Risk Factors:Hypertension and                 Dyslipidemia.  Sonographer:    Johny Chess RDCS Referring Phys: [id number] Miguel Medal IMPRESSIONS  1. Left ventricular ejection fraction, by estimation, is 60 to 65%. The left ventricle has normal function. The left ventricle has no regional wall motion abnormalities. Left ventricular diastolic parameters are consistent with Grade I diastolic dysfunction (impaired relaxation). Elevated left atrial pressure.  2. Right ventricular systolic function is normal. The right ventricular size is normal. There is normal pulmonary artery systolic pressure.  3. The mitral valve is abnormal. Mild mitral valve regurgitation. No evidence of mitral stenosis.  4. The tricuspid valve is abnormal.  5. The aortic valve is tricuspid. There is mild calcification of the aortic valve. There is mild thickening of the aortic valve. Aortic valve regurgitation is not visualized. No aortic stenosis is present.  6. The inferior vena cava is normal in size with greater than 50% respiratory variability, suggesting right atrial pressure of 3 mmHg. FINDINGS  Left Ventricle: Left ventricular ejection fraction, by estimation, is 60 to 65%. The left ventricle has normal function. The left ventricle has no regional wall motion abnormalities. The left ventricular internal cavity size was normal in size. There is  no left ventricular  hypertrophy. Left ventricular diastolic parameters are consistent with Grade I diastolic dysfunction (impaired relaxation). Elevated left atrial pressure. Right Ventricle: The right ventricular size is normal. No increase in right ventricular wall thickness. Right ventricular systolic function is normal. There is normal pulmonary artery systolic pressure. The tricuspid regurgitant velocity is 2.09  m/s, and  with an assumed right atrial pressure of 3 mmHg, the estimated right ventricular systolic pressure is 73.4 mmHg. Left Atrium: Left atrial size was normal in size. Right Atrium: Right atrial size was normal in size. Pericardium: There is no evidence of pericardial effusion. Mitral Valve: The mitral valve is abnormal. There is mild thickening of the mitral valve leaflet(s). There is mild calcification of the mitral valve leaflet(s). Mild mitral annular calcification. Mild mitral valve regurgitation. No evidence of mitral valve stenosis. Tricuspid Valve: The tricuspid valve is abnormal. Tricuspid valve regurgitation is mild . No evidence of tricuspid stenosis. Aortic Valve: The aortic valve is tricuspid. There is mild calcification of the aortic valve. There is mild thickening of the aortic valve. There is mild aortic valve annular calcification. Aortic valve regurgitation is not visualized. No aortic stenosis  is present. Aortic valve mean gradient measures 3.9 mmHg. Aortic valve peak gradient measures 7.8 mmHg. Aortic valve area, by VTI measures 1.76 cm. Pulmonic Valve: The pulmonic valve was not well visualized. Pulmonic valve regurgitation is not visualized. No evidence of pulmonic stenosis. Aorta: The aortic root is normal in size and structure. Venous: The inferior vena cava is normal in size with greater than 50% respiratory variability, suggesting right atrial pressure of 3 mmHg. IAS/Shunts: No atrial level shunt detected by color flow Doppler.  LEFT VENTRICLE PLAX 2D LVIDd:         4.80 cm   Diastology  LVIDs:         3.40 cm   LV e' medial:    5.22 cm/s LV PW:         0.90 cm   LV E/e' medial:  16.3 LV IVS:        0.90 cm   LV e' lateral:   5.44 cm/s LVOT diam:     1.75 cm   LV E/e' lateral: 15.7 LV SV:         48 LV SV Index:   29 LVOT Area:     2.41 cm  RIGHT VENTRICLE             IVC RV Basal diam:  2.40 cm     IVC diam: 1.90 cm RV S prime:     12.10 cm/s TAPSE (M-mode): 2.0 cm LEFT ATRIUM             Index        RIGHT ATRIUM           Index LA diam:        3.90 cm 2.38 cm/m   RA Area:     11.20 cm LA Vol (A2C):   33.5 ml 20.47 ml/m  RA Volume:   23.80 ml  14.54 ml/m LA Vol (A4C):   40.4 ml 24.69 ml/m LA Biplane Vol: 37.6 ml 22.98 ml/m  AORTIC VALVE AV Area (Vmax):    1.68 cm AV Area (Vmean):   1.74 cm AV Area (VTI):     1.76 cm AV Vmax:           139.96 cm/s AV Vmean:          91.776 cm/s AV VTI:            0.271 m AV Peak Grad:      7.8 mmHg AV Mean Grad:      3.9 mmHg LVOT Vmax:         97.90 cm/s LVOT Vmean:        66.300 cm/s LVOT VTI:  0.198 m LVOT/AV VTI ratio: 0.73  AORTA Ao Root diam: 2.80 cm Ao Asc diam:  3.10 cm MITRAL VALVE                TRICUSPID VALVE MV Area (PHT): 2.54 cm     TR Peak grad:   17.5 mmHg MV Decel Time: 299 msec     TR Vmax:        209.00 cm/s MV E velocity: 85.20 cm/s MV A velocity: 114.00 cm/s  SHUNTS MV E/A ratio:  0.75         Systemic VTI:  0.20 m                             Systemic Diam: 1.75 cm Carlyle Dolly MD Electronically signed by Carlyle Dolly MD Signature Date/Time: 07/25/2021/4:47:36 PM    Final    CT HEAD CODE STROKE WO CONTRAST  Result Date: 07/24/2021 CLINICAL DATA:  Code stroke.  Encephalopathy EXAM: CT HEAD WITHOUT CONTRAST TECHNIQUE: Contiguous axial images were obtained from the base of the skull through the vertex without intravenous contrast. RADIATION DOSE REDUCTION: This exam was performed according to the departmental dose-optimization program which includes automated exposure control, adjustment of the mA and/or kV according  to patient size and/or use of iterative reconstruction technique. COMPARISON:  None. FINDINGS: Brain: There is no mass, hemorrhage or extra-axial collection. The size and configuration of the ventricles and extra-axial CSF spaces are normal. The brain parenchyma is normal, without evidence of acute or chronic infarction. Vascular: No abnormal hyperdensity of the major intracranial arteries or dural venous sinuses. No intracranial atherosclerosis. Skull: The visualized skull base, calvarium and extracranial soft tissues are normal. Sinuses/Orbits: No fluid levels or advanced mucosal thickening of the visualized paranasal sinuses. No mastoid or middle ear effusion. The orbits are normal. ASPECTS [facility] Stroke Program Early CT Score) - Ganglionic level infarction (caudate, lentiform nuclei, internal capsule, insula, M1-M3 cortex): 7 - Supraganglionic infarction (M4-M6 cortex): 3 Total score (0-10 with 10 being normal): 10 IMPRESSION: 1. No acute intracranial abnormality. 2. ASPECTS is 10. These results were called by telephone at the time of interpretation on 07/24/2021 at 7:31 pm to provider Fredia Sorrow , who verbally acknowledged these results. Electronically Signed   By: Ulyses Jarred M.D.   On: 07/24/2021 19:31      Subjective: Patient seen and examined the bedside this morning.  Hemodynamically stable for discharge today  Discharge Exam: Vitals:   07/26/21 0327 07/26/21 0749  BP: 126/84 123/81  Pulse: 74 73  Resp: 18 18  Temp: 97.8 F (36.6 C) 97.7 F (36.5 C)  SpO2: 95% 93%   Vitals:   07/25/21 2000 07/25/21 2345 07/26/21 0327 07/26/21 0749  BP: 124/70 (!) 146/78 126/84 123/81  Pulse: 71 (!) 58 74 73  Resp: 17 17 18 18   Temp: 97.7 F (36.5 C) 98.3 F (36.8 C) 97.8 F (36.6 C) 97.7 F (36.5 C)  TempSrc: Oral Oral Oral Oral  SpO2: 93% 92% 95% 93%  Weight:      Height:        General: Pt is alert, awake, not in acute distress Cardiovascular: RRR, S1/S2 +, no rubs, no  gallops Respiratory: CTA bilaterally, no wheezing, no rhonchi Abdominal: Soft, NT, ND, bowel sounds + Extremities: no edema, no cyanosis    The results of significant diagnostics from this hospitalization (including imaging, microbiology, ancillary and laboratory) are listed below for reference.  Microbiology: Recent Results (from the past 240 hour(s))  Resp Panel by RT-PCR (Flu A&B, Covid) Nasopharyngeal Swab     Status: None   Collection Time: 07/24/21  7:44 PM   Specimen: Nasopharyngeal Swab; Nasopharyngeal(NP) swabs in vial transport medium  Result Value Ref Range Status   SARS Coronavirus 2 by RT PCR NEGATIVE NEGATIVE Final    Comment: (NOTE) SARS-CoV-2 target nucleic acids are NOT DETECTED.  The SARS-CoV-2 RNA is generally detectable in upper respiratory specimens during the acute phase of infection. The lowest concentration of SARS-CoV-2 viral copies this assay can detect is 138 copies/mL. A negative result does not preclude SARS-Cov-2 infection and should not be used as the sole basis for treatment or other patient management decisions. A negative result may occur with  improper specimen collection/handling, submission of specimen other than nasopharyngeal swab, presence of viral mutation(s) within the areas targeted by this assay, and inadequate number of viral copies(<138 copies/mL). A negative result must be combined with clinical observations, patient history, and epidemiological information. The expected result is Negative.  Fact Sheet for Patients:  EntrepreneurPulse.com.au  Fact Sheet for Healthcare Providers:  IncredibleEmployment.be  This test is no t yet approved or cleared by the [place] FDA and  has been authorized for detection and/or diagnosis of SARS-CoV-2 by FDA under an Emergency Use Authorization (EUA). This EUA will remain  in effect (meaning this test can be used) for the duration of the COVID-19  declaration under Section 564(b)(1) of the Act, 21 U.S.C.section 360bbb-3(b)(1), unless the authorization is terminated  or revoked sooner.       Influenza A by PCR NEGATIVE NEGATIVE Final   Influenza B by PCR NEGATIVE NEGATIVE Final    Comment: (NOTE) The Xpert Xpress SARS-CoV-2/FLU/RSV plus assay is intended as an aid in the diagnosis of influenza from Nasopharyngeal swab specimens and should not be used as a sole basis for treatment. Nasal washings and aspirates are unacceptable for Xpert Xpress SARS-CoV-2/FLU/RSV testing.  Fact Sheet for Patients: EntrepreneurPulse.com.au  Fact Sheet for Healthcare Providers: IncredibleEmployment.be  This test is not yet approved or cleared by the [place] FDA and has been authorized for detection and/or diagnosis of SARS-CoV-2 by FDA under an Emergency Use Authorization (EUA). This EUA will remain in effect (meaning this test can be used) for the duration of the COVID-19 declaration under Section 564(b)(1) of the Act, 21 U.S.C. section 360bbb-3(b)(1), unless the authorization is terminated or revoked.  Performed at [facility], [address], [place], [place] [address]      Labs: BNP (last 3 results) No results for input(s): BNP in the last 8760 hours. Basic Metabolic Panel: Recent Labs  Lab 07/24/21 1859  NA 139  K 3.8  CL 102  CO2 24  GLUCOSE 95  BUN 20  CREATININE 0.98  CALCIUM 10.0   Liver Function Tests: Recent Labs  Lab 07/24/21 1859  AST 23  ALT 19  ALKPHOS 103  BILITOT 0.7  PROT 8.1  ALBUMIN 5.0   No results for input(s): LIPASE, AMYLASE in the last 168 hours. No results for input(s): AMMONIA in the last 168 hours. CBC: Recent Labs  Lab 07/24/21 1859 07/26/21 0042  WBC 12.2* 7.8  NEUTROABS 4.6 4.3  HGB 14.8 14.3  HCT 45.5 44.8  MCV 88.9 89.8  PLT 599* 516*   Cardiac Enzymes: No results for input(s): CKTOTAL, CKMB, CKMBINDEX, TROPONINI  in the last 168 hours. BNP: Invalid input(s): POCBNP CBG: Recent Labs  Lab 07/24/21 1857 07/24/21  1934  GLUCAP 91 97   D-Dimer No results for input(s): DDIMER in the last 72 hours. Hgb A1c No results for input(s): HGBA1C in the last 72 hours. Lipid Profile No results for input(s): CHOL, HDL, LDLCALC, TRIG, CHOLHDL, LDLDIRECT in the last 72 hours. Thyroid function studies No results for input(s): TSH, T4TOTAL, T3FREE, THYROIDAB in the last 72 hours.  Invalid input(s): FREET3 Anemia work up Recent Labs    07/26/21 0042  VITAMINB12 137*  FOLATE 12.6   Urinalysis    Component Value Date/Time   COLORURINE YELLOW 07/24/2021 2050   APPEARANCEUR CLEAR 07/24/2021 2050   [place] 1.016 07/24/2021 2050   [place] 6.5 07/24/2021 2050   GLUCOSEU NEGATIVE 07/24/2021 2050   [place] NEGATIVE 07/24/2021 2050   [place] NEGATIVE 07/24/2021 2050   KETONESUR NEGATIVE 07/24/2021 2050   PROTEINUR NEGATIVE 07/24/2021 2050   UROBILINOGEN 0.2 12/23/2014 1033   NITRITE NEGATIVE 07/24/2021 2050   LEUKOCYTESUR NEGATIVE 07/24/2021 2050   Sepsis Labs Invalid input(s): PROCALCITONIN,  WBC,  LACTICIDVEN Microbiology Recent Results (from the past 240 hour(s))  Resp Panel by RT-PCR (Flu A&B, Covid) Nasopharyngeal Swab     Status: None   Collection Time: 07/24/21  7:44 PM   Specimen: Nasopharyngeal Swab; Nasopharyngeal(NP) swabs in vial transport medium  Result Value Ref Range Status   SARS Coronavirus 2 by RT PCR NEGATIVE NEGATIVE Final    Comment: (NOTE) SARS-CoV-2 target nucleic acids are NOT DETECTED.  The SARS-CoV-2 RNA is generally detectable in upper respiratory specimens during the acute phase of infection. The lowest concentration of SARS-CoV-2 viral copies this assay can detect is 138 copies/mL. A negative result does not preclude SARS-Cov-2 infection and should not be used as the sole basis for treatment or other patient management decisions. A negative result may occur with   improper specimen collection/handling, submission of specimen other than nasopharyngeal swab, presence of viral mutation(s) within the areas targeted by this assay, and inadequate number of viral copies(<138 copies/mL). A negative result must be combined with clinical observations, patient history, and epidemiological information. The expected result is Negative.  Fact Sheet for Patients:  EntrepreneurPulse.com.au  Fact Sheet for Healthcare Providers:  IncredibleEmployment.be  This test is no t yet approved or cleared by the [place] FDA and  has been authorized for detection and/or diagnosis of SARS-CoV-2 by FDA under an Emergency Use Authorization (EUA). This EUA will remain  in effect (meaning this test can be used) for the duration of the COVID-19 declaration under Section 564(b)(1) of the Act, 21 U.S.C.section 360bbb-3(b)(1), unless the authorization is terminated  or revoked sooner.       Influenza A by PCR NEGATIVE NEGATIVE Final   Influenza B by PCR NEGATIVE NEGATIVE Final    Comment: (NOTE) The Xpert Xpress SARS-CoV-2/FLU/RSV plus assay is intended as an aid in the diagnosis of influenza from Nasopharyngeal swab specimens and should not be used as a sole basis for treatment. Nasal washings and aspirates are unacceptable for Xpert Xpress SARS-CoV-2/FLU/RSV testing.  Fact Sheet for Patients: EntrepreneurPulse.com.au  Fact Sheet for Healthcare Providers: IncredibleEmployment.be  This test is not yet approved or cleared by the [place] FDA and has been authorized for detection and/or diagnosis of SARS-CoV-2 by FDA under an Emergency Use Authorization (EUA). This EUA will remain in effect (meaning this test can be used) for the duration of the COVID-19 declaration under Section 564(b)(1) of the Act, 21 U.S.C. section 360bbb-3(b)(1), unless the authorization is terminated  or revoked.  Performed at [facility], [address] [place], [place] [address]     Please note: You were cared for by a hospitalist during your hospital stay. Once you are discharged, your primary care physician will handle any further medical issues. Please note that NO REFILLS for any discharge medications will be authorized once you are discharged, as it is imperative that you return to your primary care physician (or establish a relationship with a primary care physician if you do not have one) for your post hospital discharge needs so that they can reassess your need for medications and monitor your lab values.    Time coordinating discharge: 40 minutes  SIGNED:   Shelly Coss, MD  [facility] 07/26/2021, 10:00 AM Pager [phone]  If 7PM-7AM, please contact night-coverage [email or web address] Password [email or web address]

## 2021-07-26 NOTE — Care Management (Signed)
°  Transition of Care [facility]) Screening Note   Patient Details  Name: LILLYANNE BRADBURN Date of Birth: [birth year]   Transition of Care [facility]) CM/SW Contact:    Carles Collet, RN Phone Number: 07/26/2021, 7:40 AM    Transition of Care Department [facility]) has reviewed patient and we will continue to monitor patient advancement through interdisciplinary progression rounds. If new patient transition needs arise, please place a TOC consult.

## 2021-07-27 ENCOUNTER — Telehealth: Payer: Self-pay

## 2021-07-27 NOTE — Telephone Encounter (Signed)
Called patient on 07/27/2021 , 11:47 AM in an attempt to reach the patient for a hospital follow up.   Admit date: 07/24/21 Discharge: 07/26/21   She does not have any questions or concerns about medications from the hospital admission. The patient's medications were reviewed over the phone, they were counseled to bring in all current medications to the hospital follow up visit.   I advised the patient to call if any questions or concerns arise about the hospital admission or medications    Home health was not started in the hospital.  All questions were answered and a follow up appointment was made.   Prior to Admission medications   Medication Sig Start Date End Date Taking? Authorizing Provider  Ascorbic Acid (VITAMIN C PO) Take 500 mg by mouth daily.    [provider]  aspirin EC 81 MG EC tablet Take 1 tablet (81 mg total) by mouth daily for 19 days. Swallow whole. 07/27/21 08/15/21  Shelly Coss, MD  atorvastatin (LIPITOR) 40 MG tablet Take 1 tab daily for cholesterol. Patient taking differently: Take 40 mg by mouth at bedtime. 01/29/21   Liane Comber, NP  Cholecalciferol (VITAMIN D PO) Take 5,000 Units by mouth daily.     [provider]  clopidogrel (PLAVIX) 75 MG tablet Take 1 tablet (75 mg total) by mouth daily. 07/27/21   Shelly Coss, MD  desonide (DESOWEN) 0.05 % cream Apply topically 2 (two) times daily. Patient taking differently: Apply 1 application topically 2 (two) times daily as needed (dry skin). 04/07/21   Magda Bernheim, NP  metoprolol succinate (TOPROL-XL) 25 MG 24 hr tablet TAKE 1 TABLET DAILY FOR BLOOD PRESSURE Patient taking differently: 25 mg at bedtime. 10/21/20   Liane Comber, NP  Omega-3 Fatty Acids (FISH OIL PO) Take 1,000 mg by mouth daily.    [provider]  Probiotic Product (PROBIOTIC PO) Take 250 mg by mouth daily.    [provider]  vitamin B-12 1000 MCG tablet Take 1 tablet (1,000 mcg total) by mouth daily. 07/27/21    Shelly Coss, MD

## 2021-07-28 DIAGNOSIS — M85851 Other specified disorders of bone density and structure, right thigh: Secondary | ICD-10-CM | POA: Diagnosis not present

## 2021-07-28 DIAGNOSIS — Z1231 Encounter for screening mammogram for malignant neoplasm of breast: Secondary | ICD-10-CM | POA: Diagnosis not present

## 2021-07-28 DIAGNOSIS — M85852 Other specified disorders of bone density and structure, left thigh: Secondary | ICD-10-CM | POA: Diagnosis not present

## 2021-07-28 LAB — HM MAMMOGRAPHY

## 2021-07-28 LAB — HM DEXA SCAN

## 2021-07-28 NOTE — Progress Notes (Signed)
Hospital follow up  Assessment and Plan: Hospital visit follow up for :   Tiffany Vaughn was seen today for hospitalization follow-up.  Diagnoses and all orders for this visit:  Global amnesia Continue Plavix Monitor symptoms  Stage 3a chronic kidney disease (HCC) Push fluids throughout the day and NSAIDS -     CBC with Differential/Platelet -     COMPLETE METABOLIC PANEL WITH GFR  Depression, major, in remission (HCC)/Anxiety Has noticed an increase in anxiety since hospitalization Begin Zoloft 25 mg 1 tab PO qd  Continue good sleep hygiene, diet and exercise Monitor symptoms  Aortic atherosclerosis (HCC) Control blood pressure, cholesterol, and weight  Essential hypertension - continue medications, DASH diet, exercise and monitor at home. Call if greater than 130/80.   -     CBC with Differential/Platelet  Mixed hyperlipidemia Continue medications:  Continue diet and exercise.  -     TSH  History of right breast cancer Continue close follow ups, mammogram annually  Vitamin B12 deficiency Was low at last check 07/26/21- 137 Continue Vit B12 supplementation 1000 mcg daily -     Vitamin B12  Medication management Continued    All medications were reviewed with patient and family and fully reconciled. All questions answered fully, and patient and family members were encouraged to call the office with any further questions or concerns. Discussed goal to avoid readmission related to this diagnosis.  There are no discontinued medications.  CAN NOT DO FOR BCBS REGULAR OR MEDICARE Over 40 minutes of exam, counseling, chart review, and complex, high/moderate level critical decision making was performed this visit.   Future Appointments  Date Time Provider [place]  08/06/2021  2:15 PM Owens Shark, NP [facility] None  10/05/2021  2:00 PM Unk Pinto, MD [facility] None     HPI 81 y.o.female presents for follow up for transition from recent hospitalization or  [facility] stay. Admit date to the hospital was 07/24/21, patient was discharged from the hospital on 07/26/21 and our clinical staff contacted the office the day after discharge to set up a follow up appointment. The discharge summary, medications, and diagnostic test results were reviewed before meeting with the patient. The patient was admitted for:   Patient is a 81 year old female with history of anemia, hypertension, hyperlipidemia, breast cancer, depression, CKD stage III who presented with memory loss, encephalopathy.  This has happened for the second time.She was admitted for the same in 2020 and was thought to have transient global amnesia.  Work-up was negative at the time.  On presentation her blood pressure was high, lab work showed mild leukocytosis.  Ethanol level, UDS, UA not impressive.  CT head without any acute abnormality.  Neurology consulted.  EEG did not show any seizure or epileptiform discharge.  MRI of the brain showed possible tiny acute infarct in the left centrum semiovale, likely an artifact.  PT/OT did not recommend any follow-up.  She is medically stable for discharge to home today, neurology cleared for discharge.   Following problems were addressed during her hospitalization:   Global amnesia: Had a similar episode in the past.  Patient became confused at home and lost her memory.  Neurological examination was unremarkable other than amnesia.  Admitted for the suspicion of stroke.  Stroke work-up initiated. CT head did not show any acute intracranial normalities.  EEG did not show any seizure or epileptiform discharge.  MRI of the brain showed tiny acute infarct in the left centrum semiovale,likely an artifact as per neurology. Marland Kitchen  She has been started on aspirin and Plavix .  Plan is to continue both for 3 weeks followed by Plavix alone.  PT/OT consulted, no follow-up recommended.  Echocardiogram showed EF of 60 to 90%, grade 1 diastolic dysfunction, no shunt.   Recent LDL of 117.   Recent A1c of 6.2. Her memory has completely recovered and she is currently alert and oriented without any focal deficits. She will follow-up with neurology as an outpatient.  Carotid Doppler to be done as an outpatient   Hyperlipidemia: Recent LDL of 117.  Takes Lipitor at home which we will continue   Hypertension: Currently blood pressure stable.  Takes metoprolol at home   History of CKD stage IIIa: Currently kidney function at baseline or better.   History of breast cancer: Currently in remission   Vitamin B12 deficiency: Given a dose of intramuscular vitamin B-12.  Continue oral supplementation  Home health is not involved.   Images while in the hospital: MR BRAIN WO CONTRAST  Result Date: 07/25/2021 CLINICAL DATA:  Neuro deficit, stroke suspected EXAM: MRI HEAD WITHOUT CONTRAST TECHNIQUE: Multiplanar, multiecho pulse sequences of the brain and surrounding structures were obtained without intravenous contrast. COMPARISON:  Noncontrast CT head dated 1 day prior, brain MRI 11/14/2018  IMPRESSION: Possible tiny acute infarct in the left centrum semiovale. Otherwise, normal for age brain MRI. Electronically Signed   By: Valetta Mole M.D.   On: 07/25/2021 13:06   EEG adult  Result Date: 07/25/2021 Lora Havens, MD     07/25/2021 10:35 AM Patient Name: Tiffany Vaughn MRN: [id number] Epilepsy Attending: Lora Havens Referring Physician/Provider: Marcelyn Bruins, MD Date: 07/25/2021 Duration: 21.31 mins Patient history: 81 year old female with what is likely recurrent transient global amnesia. EEG to evaluate for seizure. Level of alertness: Awake AEDs during EEG study: None Technical aspects: This EEG study was done with scalp electrodes positioned according to the 10-20 International system of electrode placement. Electrical activity was acquired at a sampling rate of 500Hz  and reviewed with a high frequency filter of 70Hz  and a low frequency filter of 1Hz . EEG data were recorded  continuously and digitally stored. Description: The posterior dominant rhythm consists of 9.5 Hz activity of moderate voltage (25-35 uV) seen predominantly in posterior head regions, symmetric and reactive to eye opening and eye closing. Hyperventilation did not show any EEG change. Physiologic photic driving was not seen during photic stimulation.  IMPRESSION: This study is within normal limits. No seizures or epileptiform discharges were seen throughout the recording. Lora Havens   ECHOCARDIOGRAM COMPLETE  Result Date: 07/25/2021    ECHOCARDIOGRAM REPORT   Patient Name:   Tiffany Vaughn Date of Exam: 07/25/2021 Medical Rec #:  [id number]         Height:       64.0 in Accession #:    [id number]        Weight:       131.4 lb Date of Birth:  1940/10/19        BSA:          1.636 m Patient Age:    19 years          BP:           157/80 mmHg Patient Gender: F                 HR:           66 bpm. Exam Location:  Inpatient Procedure: 2D Echo Indications:  stroke  History:        Patient has no prior history of Echocardiogram examinations.                 Chronic kidney disease; Risk Factors:Hypertension and                 Dyslipidemia.  Sonographer:    Johny Chess RDCS Referring Phys: [phone] AMRIT ADHIKARI IMPRESSIONS  1. Left ventricular ejection fraction, by estimation, is 60 to 65%. The left ventricle has normal function. The left ventricle has no regional wall motion abnormalities. Left ventricular diastolic parameters are consistent with Grade I diastolic dysfunction (impaired relaxation). Elevated left atrial pressure.  2. Right ventricular systolic function is normal. The right ventricular size is normal. There is normal pulmonary artery systolic pressure.  3. The mitral valve is abnormal. Mild mitral valve regurgitation. No evidence of mitral stenosis.  4. The tricuspid valve is abnormal.  5. The aortic valve is tricuspid. There is mild calcification of the aortic valve. There is mild  thickening of the aortic valve. Aortic valve regurgitation is not visualized. No aortic stenosis is present.  6. The inferior vena cava is normal in size with greater than 50% respiratory variability, suggesting right atrial pressure of 3 mmHg. FINDINGS  Left Ventricle: Left ventricular ejection fraction, by estimation, is 60 to 65%. The left ventricle has normal function. The left ventricle has no regional wall motion abnormalities. The left ventricular internal cavity size was normal in size. There is  no left ventricular hypertrophy. Left ventricular diastolic parameters are consistent with Grade I diastolic dysfunction (impaired relaxation). Elevated left atrial pressure. Right Ventricle: The right ventricular size is normal. No increase in right ventricular wall thickness. Right ventricular systolic function is normal. There is normal pulmonary artery systolic pressure. The tricuspid regurgitant velocity is 2.09 m/s, and  with an assumed right atrial pressure of 3 mmHg, the estimated right ventricular systolic pressure is 32.6 mmHg. Left Atrium: Left atrial size was normal in size. Right Atrium: Right atrial size was normal in size. Pericardium: There is no evidence of pericardial effusion. Mitral Valve: The mitral valve is abnormal. There is mild thickening of the mitral valve leaflet(s). There is mild calcification of the mitral valve leaflet(s). Mild mitral annular calcification. Mild mitral valve regurgitation. No evidence of mitral valve stenosis. Tricuspid Valve: The tricuspid valve is abnormal. Tricuspid valve regurgitation is mild . No evidence of tricuspid stenosis. Aortic Valve: The aortic valve is tricuspid. There is mild calcification of the aortic valve. There is mild thickening of the aortic valve. There is mild aortic valve annular calcification. Aortic valve regurgitation is not visualized. No aortic stenosis  is present. Aortic valve mean gradient measures 3.9 mmHg. Aortic valve peak gradient  measures 7.8 mmHg. Aortic valve area, by VTI measures 1.76 cm. Pulmonic Valve: The pulmonic valve was not well visualized. Pulmonic valve regurgitation is not visualized. No evidence of pulmonic stenosis. Aorta: The aortic root is normal in size and structure. Venous: The inferior vena cava is normal in size with greater than 50% respiratory variability, suggesting right atrial pressure of 3 mmHg. IAS/Shunts: No atrial level shunt detected by color flow Doppler.  LEFT VENTRICLE PLAX 2D LVIDd:         4.80 cm   Diastology LVIDs:         3.40 cm   LV e' medial:    5.22 cm/s LV PW:         0.90 cm   LV  E/e' medial:  16.3 LV IVS:        0.90 cm   LV e' lateral:   5.44 cm/s LVOT diam:     1.75 cm   LV E/e' lateral: 15.7 LV SV:         48 LV SV Index:   29 LVOT Area:     2.41 cm  RIGHT VENTRICLE             IVC RV Basal diam:  2.40 cm     IVC diam: 1.90 cm RV S prime:     12.10 cm/s TAPSE (M-mode): 2.0 cm LEFT ATRIUM             Index        RIGHT ATRIUM           Index LA diam:        3.90 cm 2.38 cm/m   RA Area:     11.20 cm LA Vol (A2C):   33.5 ml 20.47 ml/m  RA Volume:   23.80 ml  14.54 ml/m LA Vol (A4C):   40.4 ml 24.69 ml/m LA Biplane Vol: 37.6 ml 22.98 ml/m  AORTIC VALVE AV Area (Vmax):    1.68 cm AV Area (Vmean):   1.74 cm AV Area (VTI):     1.76 cm AV Vmax:           139.96 cm/s AV Vmean:          91.776 cm/s AV VTI:            0.271 m AV Peak Grad:      7.8 mmHg AV Mean Grad:      3.9 mmHg LVOT Vmax:         97.90 cm/s LVOT Vmean:        66.300 cm/s LVOT VTI:          0.198 m LVOT/AV VTI ratio: 0.73  AORTA Ao Root diam: 2.80 cm Ao Asc diam:  3.10 cm MITRAL VALVE                TRICUSPID VALVE MV Area (PHT): 2.54 cm     TR Peak grad:   17.5 mmHg MV Decel Time: 299 msec     TR Vmax:        209.00 cm/s MV E velocity: 85.20 cm/s MV A velocity: 114.00 cm/s  SHUNTS MV E/A ratio:  0.75         Systemic VTI:  0.20 m                             Systemic Diam: 1.75 cm Carlyle Dolly MD Electronically signed  by Carlyle Dolly MD Signature Date/Time: 07/25/2021/4:47:36 PM    Final    CT HEAD CODE STROKE WO CONTRAST  Result Date: 07/24/2021 CLINICAL DATA:  Code stroke.  Encephalopathy EXAM: CT HEAD WITHOUT CONTRAST TECHNIQUE: Contiguous axial images were obtained from the base of the skull through the vertex without intravenous contrast. RADIATION DOSE REDUCTION: This exam was performed according to the departmental dose-optimization program which includes automated exposure control, adjustment of the mA and/or kV according to patient size and/or use of iterative reconstruction technique. COMPARISON:  None. FINDINGS: Brain: There is no mass, hemorrhage or extra-axial collection. The size and configuration of the ventricles and extra-axial CSF spaces are normal. The brain parenchyma is normal, without evidence of acute or chronic infarction. Vascular: No abnormal hyperdensity of the major intracranial arteries  or dural venous sinuses. No intracranial atherosclerosis. Skull: The visualized skull base, calvarium and extracranial soft tissues are normal. Sinuses/Orbits: No fluid levels or advanced mucosal thickening of the visualized paranasal sinuses. No mastoid or middle ear effusion. The orbits are normal. ASPECTS [employer] Stroke Program Early CT Score) - Ganglionic level infarction (caudate, lentiform nuclei, internal capsule, insula, M1-M3 cortex): 7 - Supraganglionic infarction (M4-M6 cortex): 3 Total score (0-10 with 10 being normal): 10 IMPRESSION: 1. No acute intracranial abnormality. 2. ASPECTS is 10. These results were called by telephone at the time of interpretation on 07/24/2021 at 7:31 pm to provider Fredia Sorrow , who verbally acknowledged these results. Electronically Signed   By: Ulyses Jarred M.D.   On: 07/24/2021 19:31   She has been having intermittent mild headaches.  Denies blurred vision, dizziness, chest pain and shortness of breath.  She has been having anxiety not severe but feels not  quite right- some anxiety. She does have some worrying noted.    Current Outpatient Medications (Cardiovascular):    atorvastatin (LIPITOR) 40 MG tablet, TAKE 1 TAB DAILY FOR CHOLESTEROL.   metoprolol succinate (TOPROL-XL) 25 MG 24 hr tablet, TAKE 1 TABLET DAILY FOR BLOOD PRESSURE (Patient taking differently: 25 mg at bedtime.)   Current Outpatient Medications (Analgesics):    aspirin EC 81 MG EC tablet, Take 1 tablet (81 mg total) by mouth daily for 19 days. Swallow whole.  Current Outpatient Medications (Hematological):    clopidogrel (PLAVIX) 75 MG tablet, Take 1 tablet (75 mg total) by mouth daily.   vitamin B-12 1000 MCG tablet, Take 1 tablet (1,000 mcg total) by mouth daily.  Current Outpatient Medications (Other):    Ascorbic Acid (VITAMIN C PO), Take 500 mg by mouth daily.   Cholecalciferol (VITAMIN D PO), Take 5,000 Units by mouth daily.    desonide (DESOWEN) 0.05 % cream, Apply topically 2 (two) times daily. (Patient taking differently: Apply 1 application topically 2 (two) times daily as needed (dry skin).)   Omega-3 Fatty Acids (FISH OIL PO), Take 1,000 mg by mouth daily.   Probiotic Product (PROBIOTIC PO), Take 250 mg by mouth daily.  Past Medical History:  Diagnosis Date   Cancer of right breast ([place]) 1992   Hyperlipidemia    Hypertension    Postural hypotension 11/14/2018   Transient global amnesia 11/14/2018   Vitamin D deficiency      Allergies  Allergen Reactions   Codeine Nausea And Vomiting    "Feels like I have the flu"    ROS: all negative except above.   Physical Exam: Filed Weights   07/30/21 0958  Weight: 130 lb 3.2 oz (59.1 kg)   BP 100/70    Pulse 62    Temp 97.7 F (36.5 C)    Wt 130 lb 3.2 oz (59.1 kg)    SpO2 96%    BMI 22.35 kg/m  General Appearance: Well nourished, in no apparent distress. Eyes: PERRLA, EOMs, conjunctiva no swelling or erythema Sinuses: No Frontal/maxillary tenderness ENT/Mouth: Ext aud canals clear, TMs without  erythema, bulging. No erythema, swelling, or exudate on post pharynx.  Tonsils not swollen or erythematous. Hearing normal.  Neck: Supple, thyroid normal.  Respiratory: Respiratory effort normal, BS equal bilaterally without rales, rhonchi, wheezing or stridor.  Cardio: RRR with no MRGs. Brisk peripheral pulses without edema.  Abdomen: Soft, + BS.  Non tender, no guarding, rebound, hernias, masses. Lymphatics: Non tender without lymphadenopathy.  Musculoskeletal: Full ROM, 5/5 strength, normal gait.  Skin: Warm, dry  without rashes, lesions, ecchymosis.  Neuro: Cranial nerves intact. Normal muscle tone, no cerebellar symptoms. Sensation intact.  Psych: Awake and oriented X 3, normal affect, Insight and Judgment appropriate.     Magda Bernheim, NP 10:17 AM [facility]

## 2021-07-29 ENCOUNTER — Other Ambulatory Visit: Payer: Self-pay | Admitting: Adult Health

## 2021-07-29 DIAGNOSIS — E782 Mixed hyperlipidemia: Secondary | ICD-10-CM

## 2021-07-30 ENCOUNTER — Other Ambulatory Visit: Payer: Self-pay

## 2021-07-30 ENCOUNTER — Ambulatory Visit (INDEPENDENT_AMBULATORY_CARE_PROVIDER_SITE_OTHER): Payer: Medicare HMO | Admitting: Nurse Practitioner

## 2021-07-30 ENCOUNTER — Encounter: Payer: Self-pay | Admitting: Nurse Practitioner

## 2021-07-30 VITALS — BP 100/70 | HR 62 | Temp 97.7°F | Wt 130.2 lb

## 2021-07-30 DIAGNOSIS — I10 Essential (primary) hypertension: Secondary | ICD-10-CM | POA: Diagnosis not present

## 2021-07-30 DIAGNOSIS — Z853 Personal history of malignant neoplasm of breast: Secondary | ICD-10-CM | POA: Diagnosis not present

## 2021-07-30 DIAGNOSIS — I700 Atherosclerosis of aorta: Secondary | ICD-10-CM | POA: Diagnosis not present

## 2021-07-30 DIAGNOSIS — E538 Deficiency of other specified B group vitamins: Secondary | ICD-10-CM

## 2021-07-30 DIAGNOSIS — Z79899 Other long term (current) drug therapy: Secondary | ICD-10-CM

## 2021-07-30 DIAGNOSIS — N1831 Chronic kidney disease, stage 3a: Secondary | ICD-10-CM

## 2021-07-30 DIAGNOSIS — R413 Other amnesia: Secondary | ICD-10-CM | POA: Diagnosis not present

## 2021-07-30 DIAGNOSIS — F419 Anxiety disorder, unspecified: Secondary | ICD-10-CM

## 2021-07-30 DIAGNOSIS — E782 Mixed hyperlipidemia: Secondary | ICD-10-CM

## 2021-07-30 DIAGNOSIS — F325 Major depressive disorder, single episode, in full remission: Secondary | ICD-10-CM | POA: Diagnosis not present

## 2021-07-30 DIAGNOSIS — R69 Illness, unspecified: Secondary | ICD-10-CM | POA: Diagnosis not present

## 2021-07-30 MED ORDER — SERTRALINE HCL 25 MG PO TABS: 25.0000 mg | ORAL_TABLET | Freq: Every day | ORAL | 2 refills | Status: DC

## 2021-07-31 LAB — CBC WITH DIFFERENTIAL/PLATELET
Absolute Monocytes: 746 cells/uL (ref 200–950)
Basophils Absolute: 41 cells/uL (ref 0–200)
Basophils Relative: 0.5 %
Eosinophils Absolute: 197 cells/uL (ref 15–500)
Eosinophils Relative: 2.4 %
HCT: 46.7 % — ABNORMAL HIGH (ref 35.0–45.0)
Hemoglobin: 15.3 g/dL (ref 11.7–15.5)
Lymphs Abs: 2066 cells/uL (ref 850–3900)
MCH: 29.2 pg (ref 27.0–33.0)
MCHC: 32.8 g/dL (ref 32.0–36.0)
MCV: 89.1 fL (ref 80.0–100.0)
MPV: 10.5 fL (ref 7.5–12.5)
Monocytes Relative: 9.1 %
Neutro Abs: 5150 cells/uL (ref 1500–7800)
Neutrophils Relative %: 62.8 %
Platelets: 576 10*3/uL — ABNORMAL HIGH (ref 140–400)
RBC: 5.24 10*6/uL — ABNORMAL HIGH (ref 3.80–5.10)
RDW: 13.3 % (ref 11.0–15.0)
Total Lymphocyte: 25.2 %
WBC: 8.2 10*3/uL (ref 3.8–10.8)

## 2021-07-31 LAB — COMPLETE METABOLIC PANEL WITH GFR
AG Ratio: 1.9 (calc) (ref 1.0–2.5)
ALT: 21 U/L (ref 6–29)
AST: 22 U/L (ref 10–35)
Albumin: 4.7 g/dL (ref 3.6–5.1)
Alkaline phosphatase (APISO): 109 U/L (ref 37–153)
BUN/Creatinine Ratio: 16 (calc) (ref 6–22)
BUN: 16 mg/dL (ref 7–25)
CO2: 29 mmol/L (ref 20–32)
Calcium: 10.2 mg/dL (ref 8.6–10.4)
Chloride: 102 mmol/L (ref 98–110)
Creat: 0.98 mg/dL — ABNORMAL HIGH (ref 0.60–0.95)
Globulin: 2.5 g/dL (calc) (ref 1.9–3.7)
Glucose, Bld: 81 mg/dL (ref 65–99)
Potassium: 5.3 mmol/L (ref 3.5–5.3)
Sodium: 138 mmol/L (ref 135–146)
Total Bilirubin: 0.6 mg/dL (ref 0.2–1.2)
Total Protein: 7.2 g/dL (ref 6.1–8.1)
eGFR: 58 mL/min/{1.73_m2} — ABNORMAL LOW (ref >=60)

## 2021-07-31 LAB — TSH: TSH: 1.94 mIU/L (ref 0.40–4.50)

## 2021-07-31 LAB — VITAMIN B12: Vitamin B-12: 702 pg/mL (ref 200–1100)

## 2021-08-05 ENCOUNTER — Other Ambulatory Visit: Payer: Self-pay | Admitting: Nurse Practitioner

## 2021-08-05 DIAGNOSIS — D75839 Thrombocytosis, unspecified: Secondary | ICD-10-CM

## 2021-08-05 NOTE — Progress Notes (Signed)
New Hematology/Oncology Consult   Requesting MD: Marta Lamas, NP  [phone]  Reason for Consult: Thrombocytosis  HPI: Ms. Loewe has been referred for evaluation of thrombocytosis.  She was seen in a 73-month follow-up visit by Marta Lamas nurse practitioner 06/30/2021.  CBC showed hemoglobin 15.5 (11.7-15.5), white count 7.7, platelet count 559,000; chemistry panel unremarkable.  Pathologist review of the blood smear showed a myeloid population consisting of predominantly mature segmented neutrophils with reactive changes, a few lymphocytes appeared reactive, no immature cells identified; thrombocytosis; erythrocytosis with scattered elliptocytes.  She was hospitalized 07/24/2021 through 07/26/2021 presenting with memory issues.  She had an episode of transient global amnesia in the past.  Presentation initially felt to be recurrent transient global amnesia.  MRI showed a tiny acute infarct in the left centrum semiovale.  She was seen by neurology.  She was discharged home on Plavix and aspirin for 3 weeks and then Plavix alone.  Admission CBC showed a hemoglobin of 14.8, white count 12.2 (3.8-10.8), platelet count 599,000.  Repeat CBC on 07/26/2021 showed hemoglobin 14.3, white count 7.8 and platelet count 516,000.  B12 returned low at 137 (180-914) on 07/26/2021.  She was seen by Marta Lamas nurse practitioner for follow-up on 07/30/2021.  CBC showed hemoglobin 15.3, white count 8.2, platelet count 576,000.  B12 returned in normal range at 702.  EMR review of prior labs-01/29/2021 platelet count 503,000, 10/02/2020 platelet count 448,000, 08/20/2020 platelet count 455,000, 06/16/2020 platelet count 436,000, on 02/18/2020 and 11/05/2019 the platelet count was in normal range, 07/27/2019 platelet count 416,000.  The platelet count was in normal range on multiple CBCs between October 2020 and June 2015.    Past Medical History:  Diagnosis Date   Cancer of right breast ([place]) 1992   Hyperlipidemia    Hypertension     Postural hypotension 11/14/2018   Transient global amnesia 11/14/2018   Vitamin D deficiency   :   Past Surgical History:  Procedure Laterality Date   ABDOMINAL HYSTERECTOMY     partial   BREAST LUMPECTOMY Right    CATARACT EXTRACTION, BILATERAL Bilateral 2016   TONSILLECTOMY AND ADENOIDECTOMY       Current Outpatient Medications:    Ascorbic Acid (VITAMIN C PO), Take 500 mg by mouth daily., Disp: , Rfl:    aspirin EC 81 MG EC tablet, Take 1 tablet (81 mg total) by mouth daily for 19 days. Swallow whole., Disp: 19 tablet, Rfl: 0   atorvastatin (LIPITOR) 40 MG tablet, TAKE 1 TAB DAILY FOR CHOLESTEROL., Disp: 90 tablet, Rfl: 1   Cholecalciferol (VITAMIN D PO), Take 5,000 Units by mouth daily. , Disp: , Rfl:    clopidogrel (PLAVIX) 75 MG tablet, Take 1 tablet (75 mg total) by mouth daily., Disp: 30 tablet, Rfl: 1   desonide (DESOWEN) 0.05 % cream, Apply topically 2 (two) times daily. (Patient taking differently: Apply 1 application topically 2 (two) times daily as needed (dry skin).), Disp: 30 g, Rfl: 0   metoprolol succinate (TOPROL-XL) 25 MG 24 hr tablet, TAKE 1 TABLET DAILY FOR BLOOD PRESSURE (Patient taking differently: 25 mg at bedtime.), Disp: 90 tablet, Rfl: 3   Omega-3 Fatty Acids (FISH OIL PO), Take 1,000 mg by mouth daily., Disp: , Rfl:    Probiotic Product (PROBIOTIC PO), Take 250 mg by mouth daily., Disp: , Rfl:    sertraline (ZOLOFT) 25 MG tablet, Take 1 tablet (25 mg total) by mouth daily., Disp: 30 tablet, Rfl: 2   vitamin B-12 1000 MCG tablet, Take 1  tablet (1,000 mcg total) by mouth daily., Disp: 30 tablet, Rfl: 1:     Allergies  Allergen Reactions   Codeine Nausea And Vomiting    "Feels like I have the flu"    FH: Paternal grandfather with breast cancer.  Paternal grandmother with leukemia.  Brother with leukemia and ALS.  Maternal grandmother with stomach cancer.  Niece with breast cancer.  SOCIAL HISTORY: She lives in [place].  She is married.  She has a  son.  She is retired.  She previously worked as a Gaffer.  No tobacco or alcohol use.  Review of Systems: No fevers or sweats.  No anorexia or weight loss.  No bleeding.  No history of DVT or PE.  Recent stroke as outlined above.  No unusual headaches.  No visual disturbance.  No diplopia.  She has noted a recent increase in anxiety.  No dysphagia.  No nausea or vomiting.  No shortness of breath or cough.  No change in bowel habits, baseline chronic constipation.  No urinary symptoms.  No rash.  No pruritus.  Physical Exam:  Blood pressure 135/67, pulse 68, temperature 98.7 F (37.1 C), temperature source Oral, resp. rate 18, height 5\' 4"  (1.626 m), weight 131 lb 9.6 oz (59.7 kg), SpO2 98 %.  HEENT: Sclera anicteric.  Oropharynx without thrush or ulceration. Lungs: Lungs clear bilaterally. Cardiac: Regular rate and rhythm. Abdomen: Abdomen soft and nontender.  No hepatosplenomegaly. Vascular: No leg edema. Lymph nodes: No palpable cervical, supraclavicular, axillary or inguinal lymph nodes. Skin: No rash.  LABS:   Recent Labs    08/06/21 1500  WBC 8.8  HGB 13.5  HCT 41.8  PLT 564*   Peripheral blood smear-platelets increased in number, scattered large platelets; white blood cell morphology normal; few ovalocytes, few acanthocytes, no increase in polychromasia.  No nucleated red blood cells.  RADIOLOGY:  MR BRAIN WO CONTRAST  Result Date: 07/25/2021 CLINICAL DATA:  Neuro deficit, stroke suspected EXAM: MRI HEAD WITHOUT CONTRAST TECHNIQUE: Multiplanar, multiecho pulse sequences of the brain and surrounding structures were obtained without intravenous contrast. COMPARISON:  Noncontrast CT head dated 1 day prior, brain MRI 11/14/2018 FINDINGS: Brain: There is a punctate focus of diffusion restriction in the left centrum semiovale with associated low ADC signal (2-42). There is no other evidence of acute infarct. There is no evidence of acute intracranial hemorrhage or  extra-axial fluid collection. Parenchymal volume is normal. The ventricles are normal in size. Is minimal for age FLAIR signal abnormality in the subcortical and periventricular white matter. There is no suspicious parenchymal signal abnormality. There is no mass lesion.  There is no midline shift. Vascular: Normal flow voids. Skull and upper cervical spine: Normal marrow signal. Sinuses/Orbits: The paranasal sinuses are clear. Bilateral lens implants are in place. The globes and orbits are otherwise unremarkable. Other: None. IMPRESSION: Possible tiny acute infarct in the left centrum semiovale. Otherwise, normal for age brain MRI. Electronically Signed   By: Valetta Mole M.D.   On: 07/25/2021 13:06   EEG adult  Result Date: 07/25/2021 Lora Havens, MD     07/25/2021 10:35 AM Patient Name: CARRIE USERY MRN: [id number] Epilepsy Attending: Lora Havens Referring Physician/Provider: Marcelyn Bruins, MD Date: 07/25/2021 Duration: 21.31 mins Patient history: 81 year old female with what is likely recurrent transient global amnesia. EEG to evaluate for seizure. Level of alertness: Awake AEDs during EEG study: None Technical aspects: This EEG study was done with scalp electrodes positioned according to the 10-20 International  system of electrode placement. Electrical activity was acquired at a sampling rate of 500Hz  and reviewed with a high frequency filter of 70Hz  and a low frequency filter of 1Hz . EEG data were recorded continuously and digitally stored. Description: The posterior dominant rhythm consists of 9.5 Hz activity of moderate voltage (25-35 uV) seen predominantly in posterior head regions, symmetric and reactive to eye opening and eye closing. Hyperventilation did not show any EEG change. Physiologic photic driving was not seen during photic stimulation.  IMPRESSION: This study is within normal limits. No seizures or epileptiform discharges were seen throughout the recording. Lora Havens   ECHOCARDIOGRAM COMPLETE  Result Date: 07/25/2021    ECHOCARDIOGRAM REPORT   Patient Name:   TEGAN BRITAIN Date of Exam: 07/25/2021 Medical Rec #:  [id number]         Height:       64.0 in Accession #:    [id number]        Weight:       131.4 lb Date of Birth:  May 15, 1941        BSA:          1.636 m Patient Age:    81 years          BP:           157/80 mmHg Patient Gender: F                 HR:           66 bpm. Exam Location:  Inpatient Procedure: 2D Echo Indications:    stroke  History:        Patient has no prior history of Echocardiogram examinations.                 Chronic kidney disease; Risk Factors:Hypertension and                 Dyslipidemia.  Sonographer:    Johny Chess RDCS Referring Phys: [phone] AMRIT ADHIKARI IMPRESSIONS  1. Left ventricular ejection fraction, by estimation, is 60 to 65%. The left ventricle has normal function. The left ventricle has no regional wall motion abnormalities. Left ventricular diastolic parameters are consistent with Grade I diastolic dysfunction (impaired relaxation). Elevated left atrial pressure.  2. Right ventricular systolic function is normal. The right ventricular size is normal. There is normal pulmonary artery systolic pressure.  3. The mitral valve is abnormal. Mild mitral valve regurgitation. No evidence of mitral stenosis.  4. The tricuspid valve is abnormal.  5. The aortic valve is tricuspid. There is mild calcification of the aortic valve. There is mild thickening of the aortic valve. Aortic valve regurgitation is not visualized. No aortic stenosis is present.  6. The inferior vena cava is normal in size with greater than 50% respiratory variability, suggesting right atrial pressure of 3 mmHg. FINDINGS  Left Ventricle: Left ventricular ejection fraction, by estimation, is 60 to 65%. The left ventricle has normal function. The left ventricle has no regional wall motion abnormalities. The left ventricular internal cavity size was normal  in size. There is  no left ventricular hypertrophy. Left ventricular diastolic parameters are consistent with Grade I diastolic dysfunction (impaired relaxation). Elevated left atrial pressure. Right Ventricle: The right ventricular size is normal. No increase in right ventricular wall thickness. Right ventricular systolic function is normal. There is normal pulmonary artery systolic pressure. The tricuspid regurgitant velocity is 2.09 m/s, and  with an assumed right atrial pressure of 3 mmHg, the estimated  right ventricular systolic pressure is 53.6 mmHg. Left Atrium: Left atrial size was normal in size. Right Atrium: Right atrial size was normal in size. Pericardium: There is no evidence of pericardial effusion. Mitral Valve: The mitral valve is abnormal. There is mild thickening of the mitral valve leaflet(s). There is mild calcification of the mitral valve leaflet(s). Mild mitral annular calcification. Mild mitral valve regurgitation. No evidence of mitral valve stenosis. Tricuspid Valve: The tricuspid valve is abnormal. Tricuspid valve regurgitation is mild . No evidence of tricuspid stenosis. Aortic Valve: The aortic valve is tricuspid. There is mild calcification of the aortic valve. There is mild thickening of the aortic valve. There is mild aortic valve annular calcification. Aortic valve regurgitation is not visualized. No aortic stenosis  is present. Aortic valve mean gradient measures 3.9 mmHg. Aortic valve peak gradient measures 7.8 mmHg. Aortic valve area, by VTI measures 1.76 cm. Pulmonic Valve: The pulmonic valve was not well visualized. Pulmonic valve regurgitation is not visualized. No evidence of pulmonic stenosis. Aorta: The aortic root is normal in size and structure. Venous: The inferior vena cava is normal in size with greater than 50% respiratory variability, suggesting right atrial pressure of 3 mmHg. IAS/Shunts: No atrial level shunt detected by color flow Doppler.  LEFT VENTRICLE PLAX 2D  LVIDd:         4.80 cm   Diastology LVIDs:         3.40 cm   LV e' medial:    5.22 cm/s LV PW:         0.90 cm   LV E/e' medial:  16.3 LV IVS:        0.90 cm   LV e' lateral:   5.44 cm/s LVOT diam:     1.75 cm   LV E/e' lateral: 15.7 LV SV:         48 LV SV Index:   29 LVOT Area:     2.41 cm  RIGHT VENTRICLE             IVC RV Basal diam:  2.40 cm     IVC diam: 1.90 cm RV S prime:     12.10 cm/s TAPSE (M-mode): 2.0 cm LEFT ATRIUM             Index        RIGHT ATRIUM           Index LA diam:        3.90 cm 2.38 cm/m   RA Area:     11.20 cm LA Vol (A2C):   33.5 ml 20.47 ml/m  RA Volume:   23.80 ml  14.54 ml/m LA Vol (A4C):   40.4 ml 24.69 ml/m LA Biplane Vol: 37.6 ml 22.98 ml/m  AORTIC VALVE AV Area (Vmax):    1.68 cm AV Area (Vmean):   1.74 cm AV Area (VTI):     1.76 cm AV Vmax:           139.96 cm/s AV Vmean:          91.776 cm/s AV VTI:            0.271 m AV Peak Grad:      7.8 mmHg AV Mean Grad:      3.9 mmHg LVOT Vmax:         97.90 cm/s LVOT Vmean:        66.300 cm/s LVOT VTI:          0.198 m LVOT/AV VTI ratio: 0.73  AORTA Ao Root diam: 2.80 cm Ao Asc diam:  3.10 cm MITRAL VALVE                TRICUSPID VALVE MV Area (PHT): 2.54 cm     TR Peak grad:   17.5 mmHg MV Decel Time: 299 msec     TR Vmax:        209.00 cm/s MV E velocity: 85.20 cm/s MV A velocity: 114.00 cm/s  SHUNTS MV E/A ratio:  0.75         Systemic VTI:  0.20 m                             Systemic Diam: 1.75 cm Carlyle Dolly MD Electronically signed by Carlyle Dolly MD Signature Date/Time: 07/25/2021/4:47:36 PM    Final    CT HEAD CODE STROKE WO CONTRAST  Result Date: 07/24/2021 CLINICAL DATA:  Code stroke.  Encephalopathy EXAM: CT HEAD WITHOUT CONTRAST TECHNIQUE: Contiguous axial images were obtained from the base of the skull through the vertex without intravenous contrast. RADIATION DOSE REDUCTION: This exam was performed according to the departmental dose-optimization program which includes automated exposure control,  adjustment of the mA and/or kV according to patient size and/or use of iterative reconstruction technique. COMPARISON:  None. FINDINGS: Brain: There is no mass, hemorrhage or extra-axial collection. The size and configuration of the ventricles and extra-axial CSF spaces are normal. The brain parenchyma is normal, without evidence of acute or chronic infarction. Vascular: No abnormal hyperdensity of the major intracranial arteries or dural venous sinuses. No intracranial atherosclerosis. Skull: The visualized skull base, calvarium and extracranial soft tissues are normal. Sinuses/Orbits: No fluid levels or advanced mucosal thickening of the visualized paranasal sinuses. No mastoid or middle ear effusion. The orbits are normal. ASPECTS [facility] Stroke Program Early CT Score) - Ganglionic level infarction (caudate, lentiform nuclei, internal capsule, insula, M1-M3 cortex): 7 - Supraganglionic infarction (M4-M6 cortex): 3 Total score (0-10 with 10 being normal): 10 IMPRESSION: 1. No acute intracranial abnormality. 2. ASPECTS is 10. These results were called by telephone at the time of interpretation on 07/24/2021 at 7:31 pm to provider Fredia Sorrow , who verbally acknowledged these results. Electronically Signed   By: Ulyses Jarred M.D.   On: 07/24/2021 19:31   VAS US CAROTID  Result Date: 07/26/2021 Carotid Arterial Duplex Study Patient Name:  ARDITH TEST  Date of Exam:   07/26/2021 Medical Rec #: [id number]          Accession #:    [id number] Date of Birth: Mar 12, 1941         Patient Gender: F Patient Age:   43 years Exam Location:  [facility] Procedure:      VAS US CAROTID Referring Phys: Janine Ores --------------------------------------------------------------------------------  Indications:  CVA. Risk Factors: Hypertension, hyperlipidemia. Performing Technologist: Oliver Hum RVT  Examination Guidelines: A complete evaluation includes B-mode imaging, spectral Doppler, color Doppler, and  power Doppler as needed of all accessible portions of each vessel. Bilateral testing is considered an integral part of a complete examination. Limited examinations for reoccurring indications may be performed as noted.  Right Carotid Findings: +----------+--------+--------+--------+-----------------------+--------------+             PSV cm/s EDV cm/s Stenosis Plaque Description      Comments        +----------+--------+--------+--------+-----------------------+--------------+  CCA Prox   67       12  smooth and heterogenous                 +----------+--------+--------+--------+-----------------------+--------------+  CCA Distal 64       16                smooth and heterogenous                 +----------+--------+--------+--------+-----------------------+--------------+  ICA Prox   58       22                smooth and heterogenous                 +----------+--------+--------+--------+-----------------------+--------------+  ICA Distal 39       14                                        Not visualized  +----------+--------+--------+--------+-----------------------+--------------+  ECA        75       7                                                         +----------+--------+--------+--------+-----------------------+--------------+ +----------+--------+-------+--------+-------------------+             PSV cm/s EDV cms Describe Arm Pressure (mmHG)  +----------+--------+-------+--------+-------------------+  Subclavian 93                                             +----------+--------+-------+--------+-------------------+ +---------+--------+--+--------+--+---------+  Vertebral PSV cm/s 46 EDV cm/s 13 Antegrade  +---------+--------+--+--------+--+---------+  Left Carotid Findings: +----------+--------+--------+--------+-----------------------+--------+             PSV cm/s EDV cm/s Stenosis Plaque Description      Comments  +----------+--------+--------+--------+-----------------------+--------+   CCA Prox   70       16                smooth and heterogenous           +----------+--------+--------+--------+-----------------------+--------+  CCA Distal 69       17                smooth and heterogenous           +----------+--------+--------+--------+-----------------------+--------+  ICA Prox   49       19                smooth and heterogenous           +----------+--------+--------+--------+-----------------------+--------+  ICA Distal 45       17                                        tortuous  +----------+--------+--------+--------+-----------------------+--------+  ECA        66       7                                                   +----------+--------+--------+--------+-----------------------+--------+ +----------+--------+--------+--------+-------------------+  PSV cm/s EDV cm/s Describe Arm Pressure (mmHG)  +----------+--------+--------+--------+-------------------+  Subclavian 91                                              +----------+--------+--------+--------+-------------------+ +---------+--------+--+--------+--+---------+  Vertebral PSV cm/s 32 EDV cm/s 10 Antegrade  +---------+--------+--+--------+--+---------+   Summary: Right Carotid: Velocities in the right ICA are consistent with a 1-39% stenosis. Left Carotid: Velocities in the left ICA are consistent with a 1-39% stenosis. Vertebrals: Bilateral vertebral arteries demonstrate antegrade flow. *See table(s) above for measurements and observations.  Electronically signed by Servando Snare MD on 07/26/2021 at 3:51:10 PM.    Final     Assessment and Plan:   Thrombocytosis Hemoglobin upper end of normal range Acute infarct left centrum semiovale 07/25/2021, currently on Plavix and aspirin for 3 weeks then Plavix alone B12 deficiency, on oral replacement  Remote history of right breast cancer, status post radiation, completed 5 years of tamoxifen History of transient global amnesia Hypertension Hyperlipidemia Multiple  family members with malignancy  Ms. Halley is an 81 year old woman referred for evaluation of thrombocytosis.  We discussed potential etiologies including infection/inflammation, iron deficiency, myeloproliferative disorder.  We obtained iron studies and testing for JAK2 mutation today.  We discussed her family history.  There are multiple family members with malignancy including paternal grandfather with breast cancer.  She has a personal history of breast cancer.  She agrees to a referral for genetic counseling.  She will return for lab and follow-up in 3 weeks.  Patient seen with Dr. Benay Spice.    Ned Card, NP 08/06/2021, 3:48 PM   This was a shared visit with Ned Card.  Ms. Dagley was interviewed and examined.  I reviewed the peripheral blood smear.  She is referred for evaluation of thrombocytosis.  There is no clear explanation for thrombocytosis on review of her history, physical examination, and peripheral blood smear today.  The differential diagnosis includes a myeloproliferative disorder versus reactive thrombocytosis.  We obtained additional diagnostic evaluation today including iron studies and a myeloproliferative neoplasm panel.  We discussed the rationale for platelet lowering therapy if she has a myeloproliferative disorder.  She has a history of a CVA and is currently maintained on aspirin and Plavix.  I was present for greater than 50% of today's visit.  I performed medical decision making.  Julieanne Manson, MD

## 2021-08-06 ENCOUNTER — Inpatient Hospital Stay: Payer: Medicare HMO

## 2021-08-06 ENCOUNTER — Inpatient Hospital Stay: Payer: Medicare HMO | Attending: Nurse Practitioner | Admitting: Nurse Practitioner

## 2021-08-06 ENCOUNTER — Encounter: Payer: Self-pay | Admitting: Nurse Practitioner

## 2021-08-06 ENCOUNTER — Other Ambulatory Visit: Payer: Self-pay

## 2021-08-06 VITALS — BP 135/67 | HR 68 | Temp 98.7°F | Resp 18 | Ht 64.0 in | Wt 131.6 lb

## 2021-08-06 DIAGNOSIS — Z853 Personal history of malignant neoplasm of breast: Secondary | ICD-10-CM | POA: Insufficient documentation

## 2021-08-06 DIAGNOSIS — D75839 Thrombocytosis, unspecified: Secondary | ICD-10-CM | POA: Insufficient documentation

## 2021-08-06 DIAGNOSIS — D471 Chronic myeloproliferative disease: Secondary | ICD-10-CM | POA: Insufficient documentation

## 2021-08-06 DIAGNOSIS — E538 Deficiency of other specified B group vitamins: Secondary | ICD-10-CM | POA: Insufficient documentation

## 2021-08-06 DIAGNOSIS — E785 Hyperlipidemia, unspecified: Secondary | ICD-10-CM | POA: Diagnosis not present

## 2021-08-06 DIAGNOSIS — I129 Hypertensive chronic kidney disease with stage 1 through stage 4 chronic kidney disease, or unspecified chronic kidney disease: Secondary | ICD-10-CM | POA: Diagnosis not present

## 2021-08-06 LAB — CBC WITH DIFFERENTIAL (CANCER CENTER ONLY)
Abs Immature Granulocytes: 0.03 10*3/uL (ref 0.00–0.07)
Basophils Absolute: 0 10*3/uL (ref 0.0–0.1)
Basophils Relative: 0 %
Eosinophils Absolute: 0.2 10*3/uL (ref 0.0–0.5)
Eosinophils Relative: 2 %
HCT: 41.8 % (ref 36.0–46.0)
Hemoglobin: 13.5 g/dL (ref 12.0–15.0)
Immature Granulocytes: 0 %
Lymphocytes Relative: 24 %
Lymphs Abs: 2.1 10*3/uL (ref 0.7–4.0)
MCH: 28.7 pg (ref 26.0–34.0)
MCHC: 32.3 g/dL (ref 30.0–36.0)
MCV: 88.9 fL (ref 80.0–100.0)
Monocytes Absolute: 0.8 10*3/uL (ref 0.1–1.0)
Monocytes Relative: 9 %
Neutro Abs: 5.5 10*3/uL (ref 1.7–7.7)
Neutrophils Relative %: 65 %
Platelet Count: 564 10*3/uL — ABNORMAL HIGH (ref 150–400)
RBC: 4.7 MIL/uL (ref 3.87–5.11)
RDW: 14 % (ref 11.5–15.5)
WBC Count: 8.8 10*3/uL (ref 4.0–10.5)
nRBC: 0 % (ref 0.0–0.2)

## 2021-08-06 LAB — FERRITIN: Ferritin: 41 ng/mL (ref 11–307)

## 2021-08-06 LAB — IRON AND TIBC
Iron: 88 ug/dL (ref 28–170)
Saturation Ratios: 25 % (ref 10.4–31.8)
TIBC: 354 ug/dL (ref 250–450)
UIBC: 266 ug/dL

## 2021-08-06 LAB — SAVE SMEAR(SSMR), FOR PROVIDER SLIDE REVIEW

## 2021-08-07 ENCOUNTER — Telehealth: Payer: Self-pay

## 2021-08-07 ENCOUNTER — Other Ambulatory Visit (HOSPITAL_BASED_OUTPATIENT_CLINIC_OR_DEPARTMENT_OTHER): Payer: Self-pay

## 2021-08-07 ENCOUNTER — Other Ambulatory Visit: Payer: Self-pay

## 2021-08-07 ENCOUNTER — Telehealth: Payer: Self-pay | Admitting: Nurse Practitioner

## 2021-08-07 DIAGNOSIS — D75839 Thrombocytosis, unspecified: Secondary | ICD-10-CM

## 2021-08-07 NOTE — Telephone Encounter (Signed)
Contacted patient to schedule Systems analyst per referral Ned Card submitted. Patient has been scheduled and is aware of date and time and location.

## 2021-08-07 NOTE — Telephone Encounter (Signed)
Called and spoke with the patient to schedule her appointment for lab on 08/10/21 @ 10:30. (LDH/ Jak2) Patient voiced understanding of instruction and had no further questions or concerns at this time.

## 2021-08-10 ENCOUNTER — Other Ambulatory Visit: Payer: Self-pay

## 2021-08-10 ENCOUNTER — Encounter: Payer: Self-pay | Admitting: Genetic Counselor

## 2021-08-10 ENCOUNTER — Other Ambulatory Visit: Payer: Self-pay | Admitting: Genetic Counselor

## 2021-08-10 ENCOUNTER — Inpatient Hospital Stay: Payer: Medicare HMO

## 2021-08-10 DIAGNOSIS — Z853 Personal history of malignant neoplasm of breast: Secondary | ICD-10-CM

## 2021-08-10 DIAGNOSIS — D75839 Thrombocytosis, unspecified: Secondary | ICD-10-CM | POA: Diagnosis not present

## 2021-08-10 DIAGNOSIS — E785 Hyperlipidemia, unspecified: Secondary | ICD-10-CM | POA: Diagnosis not present

## 2021-08-10 DIAGNOSIS — E538 Deficiency of other specified B group vitamins: Secondary | ICD-10-CM | POA: Diagnosis not present

## 2021-08-10 DIAGNOSIS — D471 Chronic myeloproliferative disease: Secondary | ICD-10-CM | POA: Diagnosis not present

## 2021-08-10 DIAGNOSIS — I129 Hypertensive chronic kidney disease with stage 1 through stage 4 chronic kidney disease, or unspecified chronic kidney disease: Secondary | ICD-10-CM | POA: Diagnosis not present

## 2021-08-10 LAB — LACTATE DEHYDROGENASE: LDH: 224 U/L — ABNORMAL HIGH (ref 98–192)

## 2021-08-12 ENCOUNTER — Inpatient Hospital Stay (HOSPITAL_BASED_OUTPATIENT_CLINIC_OR_DEPARTMENT_OTHER): Payer: Medicare HMO | Admitting: Genetic Counselor

## 2021-08-12 ENCOUNTER — Encounter: Payer: Self-pay | Admitting: Genetic Counselor

## 2021-08-12 ENCOUNTER — Other Ambulatory Visit: Payer: Self-pay

## 2021-08-12 ENCOUNTER — Inpatient Hospital Stay: Payer: Medicare HMO

## 2021-08-12 ENCOUNTER — Encounter: Payer: Self-pay | Admitting: Internal Medicine

## 2021-08-12 DIAGNOSIS — Z803 Family history of malignant neoplasm of breast: Secondary | ICD-10-CM | POA: Insufficient documentation

## 2021-08-12 DIAGNOSIS — Z853 Personal history of malignant neoplasm of breast: Secondary | ICD-10-CM | POA: Diagnosis not present

## 2021-08-12 DIAGNOSIS — Z806 Family history of leukemia: Secondary | ICD-10-CM | POA: Insufficient documentation

## 2021-08-12 LAB — GENETIC SCREENING ORDER

## 2021-08-12 NOTE — Progress Notes (Signed)
REFERRING PROVIDER: Owens Shark, NP [address] [place],  [place] [address]  PRIMARY PROVIDER:  Unk Pinto, MD  PRIMARY REASON FOR VISIT:  1. History of right breast cancer   2. Family history of breast cancer   3. Family history of leukemia      HISTORY OF PRESENT ILLNESS:   [name], a 81 y.o. female, was seen for a [facility] cancer genetics consultation at the request of Dr. Marcello Moores due to a personal and family history of cancer.  Tiffany Vaughn presents to clinic today to discuss the possibility of a hereditary predisposition to cancer, genetic testing, and to further clarify her future cancer risks, as well as potential cancer risks for family members.   In 1991, at the age of 76, Tiffany Vaughn was diagnosed with breast cancer. The treatment plan included lumpectomy, radiation and 5 years of tamoxifen.     CANCER HISTORY:  Oncology History   No history exists.     RISK FACTORS:  Menarche was at age 61.  First live birth at age 79.  OCP use for approximately  <5  years.  Ovaries intact: yes.  Hysterectomy: no.  Menopausal status: postmenopausal.  HRT use: 0 years. Colonoscopy: yes; normal. Mammogram within the last year: yes. Number of breast biopsies: 1. Up to date with pelvic exams: no. Any excessive radiation exposure in the past: for breast cancer treatment  Past Medical History:  Diagnosis Date   Cancer of right breast ([place]) 1992   Hyperlipidemia    Hypertension    Postural hypotension 11/14/2018   Transient global amnesia 11/14/2018   Vitamin D deficiency     Past Surgical History:  Procedure Laterality Date   ABDOMINAL HYSTERECTOMY     partial   BREAST LUMPECTOMY Right    CATARACT EXTRACTION, BILATERAL Bilateral 2016   TONSILLECTOMY AND ADENOIDECTOMY      Social History   Socioeconomic History   Marital status: Married    Spouse name: Maryann Conners   Number of children: 2   Years of education: Not on file   Highest education  level: Not on file  Occupational History   Not on file  Tobacco Use   Smoking status: Never   Smokeless tobacco: Never  Vaping Use   Vaping Use: Never used  Substance and Sexual Activity   Alcohol use: No   Drug use: No   Sexual activity: Not Currently  Other Topics Concern   Not on file  Social History Narrative   Not on file   Social Determinants of Health   Financial Resource Strain: Not on file  Food Insecurity: Not on file  Transportation Needs: Not on file  Physical Activity: Not on file  Stress: Not on file  Social Connections: Not on file     FAMILY HISTORY:  We obtained a detailed, 4-generation family history.  Significant diagnoses are listed below: Family History  Problem Relation Age of Onset   Heart attack Mother 56   Heart attack Father 26   Leukemia Brother    ALS Brother    Heart attack Brother    Cancer Maternal Aunt        NOS   Stomach cancer Maternal Grandmother        d. 48   Heart attack Maternal Grandfather    Leukemia Paternal Grandmother    Breast cancer Paternal Grandfather        d. 48   Breast cancer Niece 52   Leukemia Other 45  AML   Leukemia Other 13       d. 78    The patient had two children, a son and daughter.  Her daughter died at 32 from drowning.  She has two brothers and a sister.  One brother had leukemia and died from [place], another brother died of a heart attack, and her sister had a daughter with breast cancer at 62 and a granddaughter with AML dx at 9.  Both parents are deceased.  The patient's mother died at 56 from a heart attack.  She had eight siblings, one sister had a cancer NOS, and there is a maternal cousin who died in her 41's from a NOS cancer.  The maternal grandparents are deceased.  The grandfather had a heart attack and the grandmother died if stomach cancer in her late 47's.  The patient's father died at 77.  He had three sisters and a brother who are all cancer free.  The patient's paternal first  cousin had a daughter who died at 57 from AML  The paternal grandparents are deceased.  The grandmother had leukemia and the grandfather had breast cancer.  Tiffany Vaughn is unaware of previous family history of genetic testing for hereditary cancer risks. Patient's maternal ancestors are of Caucasian descent, and paternal ancestors are of Caucasian descent. There is no reported Ashkenazi Jewish ancestry. There is no known consanguinity.  GENETIC COUNSELING ASSESSMENT: Ms. Amaro is a 81 y.o. female with a personal and family history of cancer which is somewhat suggestive of a hereditary cancer syndrome and predisposition to cancer given the female breast cancer and other breast cancer in the family, as well as the number of people with leukemia. We, therefore, discussed and recommended the following at today's visit.   DISCUSSION: We discussed that, in general, most cancer is not inherited in families, but instead is sporadic or familial. Sporadic cancers occur by chance and typically happen at older ages (>50 years) as this type of cancer is caused by genetic changes acquired during an individuals lifetime. Some families have more cancers than would be expected by chance; however, the ages or types of cancer are not consistent with a known genetic mutation or known genetic mutations have been ruled out. This type of familial cancer is thought to be due to a combination of multiple genetic, environmental, hormonal, and lifestyle factors. While this combination of factors likely increases the risk of cancer, the exact source of this risk is not currently identifiable or testable.  We discussed that 5 - 10% of breast cancer is hereditary, with most cases associated with BRCA mutations.  There are other genes that can be associated with hereditary breast cancer syndromes.  These include ATM, CHEK2 and PALB2.  With the female breast cancer in the family we are most concerned with BRCA and PALB2 mutations.   Additionally, there seems to be more leukemia than usual in the family.  There is an increased risk for a RUNX1 or GATA2 mutation based on this history.  We discussed that testing is beneficial for several reasons including knowing how to follow individuals after completing their treatment, identifying whether potential treatment options such as PARP inhibitors would be beneficial, and understand if other family members could be at risk for cancer and allow them to undergo genetic testing.   We reviewed the characteristics, features and inheritance patterns of hereditary cancer syndromes. We also discussed genetic testing, including the appropriate family members to test, the process of testing, insurance coverage and  turn-around-time for results. We discussed the implications of a negative, positive, carrier and/or variant of uncertain significant result. We recommended Ms. Boer pursue genetic testing for the CustomNext-Cancer+RNAinsight gene panel.   The CustomNext-Cancer gene panel offered by [facility] and includes sequencing and rearrangement analysis for the following 91 genes: AIP, ALK, APC*, ATM*, AXIN2, BAP1, BARD1, BLM, BMPR1A, BRCA1*, BRCA2*, BRIP1*, CDC73, CDH1*, CDK4, CDKN1B, CDKN2A, CHEK2*, CTNNA1, DICER1, FANCC, FH, FLCN, GALNT12, KIF1B, LZTR1, MAX, MEN1, MET, MLH1*, MRE11A, MSH2*, MSH3, MSH6*, MUTYH*, NBN, NF1*, NF2, NTHL1, PALB2*, PHOX2B, PMS2*, POT1, PRKAR1A, PTCH1, PTEN*, RAD50, RAD51C*, RAD51D*, RB1, RECQL, RET, SDHA, SDHAF2, SDHB, SDHC, SDHD, SMAD4, SMARCA4, SMARCB1, SMARCE1, STK11, SUFU, TMEM127, TP53*, TSC1, TSC2, VHL and XRCC2 (sequencing and deletion/duplication); CASR, CFTR, CPA1, CTRC, EGFR, EGLN1, FAM175A, HOXB13, KIT, MITF, MLH3, PALLD, PDGFRA, POLD1, POLE, PRSS1, RINT1, RPS20, SPINK1 and TERT (sequencing only); EPCAM and GREM1 (deletion/duplication only). DNA and RNA analyses performed for * genes.   Based on Ms. Staff's personal and family history of cancer, she meets  medical criteria for genetic testing. Despite that she meets criteria, she may still have an out of pocket cost. We discussed that if her out of pocket cost for testing is over $100, the laboratory will call and confirm whether she wants to proceed with testing.  If the out of pocket cost of testing is less than $100 she will be billed by the genetic testing laboratory.   PLAN: After considering the risks, benefits, and limitations, Ms. Brabender provided informed consent to pursue genetic testing and the blood sample was sent to [facility] for analysis of the CustomNext-Expanded+RNAinsight. Results should be available within approximately 2-3 weeks' time, at which point they will be disclosed by telephone to Ms. Polinsky, as will any additional recommendations warranted by these results. Ms. Leppla will receive a summary of her genetic counseling visit and a copy of her results once available. This information will also be available in Epic.   Lastly, we encouraged Ms. Kise to remain in contact with cancer genetics annually so that we can continuously update the family history and inform her of any changes in cancer genetics and testing that may be of benefit for this family.   Ms. Pinheiro questions were answered to her satisfaction today. Our contact information was provided should additional questions or concerns arise. Thank you for the referral and allowing [place] to share in the care of your patient.   Zonia Caplin P. Florene Glen, [place], [facility] Licensed, Insurance risk surveyor [email or web address] phone: [phone]  The patient was seen for a total of 30 minutes in face-to-face genetic counseling.  The patient brought her husband. This patient was discussed with Drs. Magrinat, Lindi Adie and/or Burr Medico who agrees with the above.    _______________________________________________________________________ For Office Staff:  Number of people involved in session: 2 Was an Intern/ student  involved with case: yes [employer]

## 2021-08-13 ENCOUNTER — Other Ambulatory Visit: Payer: Self-pay | Admitting: Genetic Counselor

## 2021-08-13 ENCOUNTER — Inpatient Hospital Stay: Payer: Medicare HMO

## 2021-08-13 DIAGNOSIS — Z853 Personal history of malignant neoplasm of breast: Secondary | ICD-10-CM

## 2021-08-13 DIAGNOSIS — Z806 Family history of leukemia: Secondary | ICD-10-CM | POA: Diagnosis not present

## 2021-08-13 DIAGNOSIS — Z803 Family history of malignant neoplasm of breast: Secondary | ICD-10-CM | POA: Diagnosis not present

## 2021-08-13 LAB — GENETIC SCREENING ORDER

## 2021-08-18 ENCOUNTER — Other Ambulatory Visit: Payer: Self-pay

## 2021-08-18 ENCOUNTER — Ambulatory Visit: Payer: Medicare HMO | Admitting: Neurology

## 2021-08-18 ENCOUNTER — Encounter: Payer: Self-pay | Admitting: Neurology

## 2021-08-18 VITALS — BP 154/80 | HR 55 | Ht 64.0 in | Wt 132.5 lb

## 2021-08-18 DIAGNOSIS — G454 Transient global amnesia: Secondary | ICD-10-CM

## 2021-08-18 DIAGNOSIS — I6381 Other cerebral infarction due to occlusion or stenosis of small artery: Secondary | ICD-10-CM | POA: Diagnosis not present

## 2021-08-18 LAB — JAK2 (INCLUDING V617F AND EXON 12), MPL,& CALR-NEXT GEN SEQ

## 2021-08-18 NOTE — Patient Instructions (Signed)
Continue current medications  Follow up in a year

## 2021-08-18 NOTE — Progress Notes (Signed)
[facility]  PATIENT: Tiffany Vaughn DOB: February 17, 1941  REQUESTING CLINICIAN: Unk Pinto, MD HISTORY FROM: Patient and husband  REASON FOR VISIT: Transient global amnesia    HISTORICAL  CHIEF COMPLAINT:  Chief Complaint  Patient presents with   New Patient (Initial Visit)    Rm 15. Accompanied by husband, Tiffany Vaughn. NP internal referral for global amnesia. MMSE 30/30.    HISTORY OF PRESENT ILLNESS:  This is a 81 year old woman past medical history of hypertension, hyperlipidemia, previous transient global amnesia 3 years ago in the setting of stressful incident, patient was called and told her that her grandkid was involved in a car accident, who is presenting after being admitted to the hospital for another episode of transient global amnesia.  Patient reported that she remember being in the yard doing some work and next thing that she knows, she is in the hospital.   Husband stated patient came to the house, she was out working in the yard, remove shoes off, told her that she was not feeling right, husband reported they had a normal discussion and they have decided to go to the ER together.  Husband said while in the ER, Patient became repetitive, asking the same questions over and over.  The entire episode lasted about 2 hours.  In the ED, her head CT was negative for any acute stroke but she did have a brain MRI that showed a possible tiny acute infarct in the left centrum semiovale.  She did also have a EEG which was negative for any seizures or abnormal abnormal discharges.  After a few hours she was back to her baseline, and was discharged home.  Since being home patient denies any additional episode.  She is back to her normal self, at times she will complain of 2 out of 10 frontal headache for which she takes Tylenol as needed.  No other complaints.   OTHER MEDICAL CONDITIONS: Hypertension, hyperlipidemia, TGA   REVIEW OF SYSTEMS: Full 14 system review  of systems performed and negative with exception of: as noted in the HPI   ALLERGIES: Allergies  Allergen Reactions   Codeine Nausea And Vomiting    "Feels like I have the flu"    HOME MEDICATIONS: Outpatient Medications Prior to Visit  Medication Sig Dispense Refill   Ascorbic Acid (VITAMIN C PO) Take 500 mg by mouth daily.     atorvastatin (LIPITOR) 40 MG tablet TAKE 1 TAB DAILY FOR CHOLESTEROL. 90 tablet 1   Cholecalciferol (VITAMIN D PO) Take 5,000 Units by mouth daily.      clopidogrel (PLAVIX) 75 MG tablet Take 1 tablet (75 mg total) by mouth daily. 30 tablet 1   desonide (DESOWEN) 0.05 % cream Apply topically 2 (two) times daily. (Patient taking differently: Apply 1 application topically 2 (two) times daily as needed (dry skin).) 30 g 0   metoprolol succinate (TOPROL-XL) 25 MG 24 hr tablet TAKE 1 TABLET DAILY FOR BLOOD PRESSURE (Patient taking differently: 25 mg at bedtime.) 90 tablet 3   Omega-3 Fatty Acids (FISH OIL PO) Take 1,000 mg by mouth daily.     Probiotic Product (PROBIOTIC PO) Take 250 mg by mouth daily.     sertraline (ZOLOFT) 25 MG tablet Take 1 tablet (25 mg total) by mouth daily. 30 tablet 2   vitamin B-12 1000 MCG tablet Take 1 tablet (1,000 mcg total) by mouth daily. 30 tablet 1   No facility-administered medications prior to visit.    PAST MEDICAL HISTORY: Past  Medical History:  Diagnosis Date   Cancer of right breast ([place]) 1992   Hyperlipidemia    Hypertension    Postural hypotension 11/14/2018   Transient global amnesia 11/14/2018   Vitamin D deficiency     PAST SURGICAL HISTORY: Past Surgical History:  Procedure Laterality Date   ABDOMINAL HYSTERECTOMY     partial   BREAST LUMPECTOMY Right    CATARACT EXTRACTION, BILATERAL Bilateral 2016   TONSILLECTOMY AND ADENOIDECTOMY      FAMILY HISTORY: Family History  Problem Relation Age of Onset   Heart attack Mother 42   Heart attack Father 25   Leukemia Brother    ALS Brother    Heart attack  Brother    Cancer Maternal Aunt        NOS   Stomach cancer Maternal Grandmother        d. 64   Heart attack Maternal Grandfather    Leukemia Paternal Grandmother    Breast cancer Paternal Grandfather        d. 53   Breast cancer Niece 35   Leukemia Other 19       AML   Leukemia Other 13       d. 34    SOCIAL HISTORY: Social History   Socioeconomic History   Marital status: Married    Spouse name: Tiffany Vaughn   Number of children: 2   Years of education: Not on file   Highest education level: Not on file  Occupational History   Not on file  Tobacco Use   Smoking status: Never   Smokeless tobacco: Never  Vaping Use   Vaping Use: Never used  Substance and Sexual Activity   Alcohol use: No   Drug use: No   Sexual activity: Not Currently  Other Topics Concern   Not on file  Social History Narrative   Not on file   Social Determinants of Health   Financial Resource Strain: Not on file  Food Insecurity: Not on file  Transportation Needs: Not on file  Physical Activity: Not on file  Stress: Not on file  Social Connections: Not on file  Intimate Partner Violence: Not on file    PHYSICAL EXAM  GENERAL EXAM/CONSTITUTIONAL: Vitals:  Vitals:   08/18/21 0831  BP: (!) 154/80  Pulse: (!) 55  Weight: 132 lb 8 oz (60.1 kg)  Height: 5\' 4"  (1.626 m)   Body mass index is 22.74 kg/m. Wt Readings from Last 3 Encounters:  08/18/21 132 lb 8 oz (60.1 kg)  08/06/21 131 lb 9.6 oz (59.7 kg)  07/30/21 130 lb 3.2 oz (59.1 kg)   Patient is in no distress; well developed, nourished and groomed; neck is supple  CARDIOVASCULAR: Examination of carotid arteries is normal; no carotid bruits Regular rate and rhythm, no murmurs Examination of peripheral vascular system by observation and palpation is normal  EYES: Pupils round and reactive to light, Visual fields full to confrontation, Extraocular movements intacts,   MUSCULOSKELETAL: Gait, strength, tone, movements  noted in Neurologic exam below  NEUROLOGIC: MENTAL STATUS:  MMSE - Mini Mental State Exam 08/18/2021  Orientation to time 5  Orientation to Place 5  Registration 3  Attention/ Calculation 5  Recall 3  Language- name 2 objects 2  Language- repeat 1  Language- follow 3 step command 3  Language- read & follow direction 1  Write a sentence 1  Copy design 1  Total score 30   awake, alert, oriented to person, place and time recent  and remote memory intact normal attention and concentration language fluent, comprehension intact, naming intact fund of knowledge appropriate  CRANIAL NERVE:  2nd, 3rd, 4th, 6th - pupils equal and reactive to light, visual fields full to confrontation, extraocular muscles intact, no nystagmus 5th - facial sensation symmetric 7th - facial strength symmetric 8th - hearing intact 9th - palate elevates symmetrically, uvula midline 11th - shoulder shrug symmetric 12th - tongue protrusion midline  MOTOR:  normal bulk and tone, full strength in the BUE, BLE  SENSORY:  normal and symmetric to light touch, pinprick, temperature, vibration  COORDINATION:  finger-nose-finger, fine finger movements normal  REFLEXES:  deep tendon reflexes present and symmetric  GAIT/STATION:  normal   DIAGNOSTIC DATA (LABS, IMAGING, TESTING) - I reviewed patient records, labs, notes, testing and imaging myself where available.  Lab Results  Component Value Date   WBC 8.8 08/06/2021   HGB 13.5 08/06/2021   HCT 41.8 08/06/2021   MCV 88.9 08/06/2021   PLT 564 (H) 08/06/2021      Component Value Date/Time   NA 138 07/30/2021 1030   K 5.3 07/30/2021 1030   CL 102 07/30/2021 1030   CO2 29 07/30/2021 1030   GLUCOSE 81 07/30/2021 1030   BUN 16 07/30/2021 1030   CREATININE 0.98 (H) 07/30/2021 1030   CALCIUM 10.2 07/30/2021 1030   PROT 7.2 07/30/2021 1030   ALBUMIN 5.0 07/24/2021 1859   AST 22 07/30/2021 1030   ALT 21 07/30/2021 1030   ALKPHOS 103 07/24/2021  1859   BILITOT 0.6 07/30/2021 1030   GFRNONAA 58 (L) 07/24/2021 1859   GFRNONAA 52 (L) 10/02/2020 1350   GFRAA 60 10/02/2020 1350   Lab Results  Component Value Date   CHOL 196 06/30/2021   HDL 56 06/30/2021   LDLCALC 117 (H) 06/30/2021   TRIG 122 06/30/2021   CHOLHDL 3.5 06/30/2021   Lab Results  Component Value Date   HGBA1C 6.2 (H) 06/30/2021   Lab Results  Component Value Date   VITAMINB12 702 07/30/2021   Lab Results  Component Value Date   TSH 1.94 07/30/2021    MRI Brain 07/25/2021 Possible tiny acute infarct in the left centrum semiovale. Otherwise, normal for age brain MRI.  EEG 07/25/2021 This study is within normal limits. No seizures or epileptiform discharges were seen throughout the recording.  ASSESSMENT AND PLAN  81 y.o. year old female with hypertension, hyperlipidemia, previous transient global amnesia who is presenting after another episode of memory los consistent with transient global amnesia.  During her work-up she was found to have a possibly tiny acute infarct in the left centrum semiovale, I informed patient that the possible stroke is not the reason for her amnesia. It was found incidentally.  Since leaving the hospital she is has been back to her baseline, at times complaining of mild frontal headache.  We discussed about her diagnosis, advised her to continue current medication and I will see her in 1 year for follow-up.  She is comfortable with plan, all other question answered.    1. TGA (transient global amnesia)   2. Cerebrovascular accident (CVA) due to occlusion of small artery [facility])      Patient Instructions  Continue current medications  Follow up in a year   No orders of the defined types were placed in this encounter.   No orders of the defined types were placed in this encounter.   Return in about 1 year (around 08/18/2022).  I have spent a total  of 48 minutes dedicated to this patient today, preparing to see patient,  examining the patient, ordering tests and/or medications, and counseling the patient including review of tests; performing a medically appropriate examination and evaluation; counseling and educating the patient/family/caregiver; independent independently interpreting result and communicating results to the family/patient/caregiver; and documenting clinical information in the electronic medical record.   Alric Ran, MD 08/18/2021, 5:08 PM  [facility] [address] [place], [place] [address] [phone]

## 2021-08-19 ENCOUNTER — Telehealth: Payer: Self-pay

## 2021-08-19 NOTE — Telephone Encounter (Signed)
-----   Message from Ladell Pier, MD sent at 08/18/2021  7:59 PM EST ----- Please call patient, she has  JAK2 mutation, likely has a myeloproliferative disordrer, we will discuss platelet lowering therapy at next visit

## 2021-08-19 NOTE — Telephone Encounter (Signed)
Pt verbalized understanding.

## 2021-08-27 ENCOUNTER — Other Ambulatory Visit: Payer: Medicare HMO

## 2021-08-27 ENCOUNTER — Ambulatory Visit: Payer: Medicare HMO | Admitting: Oncology

## 2021-08-31 ENCOUNTER — Telehealth: Payer: Self-pay | Admitting: Genetic Counselor

## 2021-08-31 ENCOUNTER — Ambulatory Visit: Payer: Self-pay | Admitting: Genetic Counselor

## 2021-08-31 ENCOUNTER — Encounter: Payer: Self-pay | Admitting: Genetic Counselor

## 2021-08-31 DIAGNOSIS — Z1379 Encounter for other screening for genetic and chromosomal anomalies: Secondary | ICD-10-CM

## 2021-08-31 NOTE — Progress Notes (Signed)
HPI:  Tiffany Vaughn was previously seen in the [facility] clinic due to a personal and family history of cancer and concerns regarding a hereditary predisposition to cancer. Please refer to our prior cancer genetics clinic note for more information regarding our discussion, assessment and recommendations, at the time. Tiffany Vaughn recent genetic test results were disclosed to her, as were recommendations warranted by these results. These results and recommendations are discussed in more detail below. ? ?CANCER HISTORY:  ?Oncology History  ? No history exists.  ? ? ?FAMILY HISTORY:  ?We obtained a detailed, 4-generation family history.  Significant diagnoses are listed below: ?Family History  ?Problem Relation Age of Onset  ? Heart attack Mother 36  ? Heart attack Father 36  ? Leukemia Brother   ? ALS Brother   ? Heart attack Brother   ? Cancer Maternal Aunt   ?     NOS  ? Stomach cancer Maternal Grandmother   ?     d. 73  ? Heart attack Maternal Grandfather   ? Leukemia Paternal Grandmother   ? Breast cancer Paternal Grandfather   ?     d. 34  ? Breast cancer Niece 62  ? Leukemia Other 13  ?     AML  ? Leukemia Other 13  ?     d. 69  ? ? ?The patient had two children, a son and daughter.  Her daughter died at 67 from drowning.  She has two brothers and a sister.  One brother had leukemia and died from [place], another brother died of a heart attack, and her sister had a daughter with breast cancer at 23 and a granddaughter with AML dx at 59.  Both parents are deceased. ?  ?The patient's mother died at 39 from a heart attack.  She had eight siblings, one sister had a cancer NOS, and there is a maternal cousin who died in her 41's from a NOS cancer.  The maternal grandparents are deceased.  The grandfather had a heart attack and the grandmother died if stomach cancer in her late 14's. ?  ?The patient's father died at 8.  He had three sisters and a brother who are all cancer free.  The patient's paternal  first cousin had a daughter who died at 91 from AML  The paternal grandparents are deceased.  The grandmother had leukemia and the grandfather had breast cancer. ?  ?Tiffany Vaughn is unaware of previous family history of genetic testing for hereditary cancer risks. Patient's maternal ancestors are of Caucasian descent, and paternal ancestors are of Caucasian descent. There is no reported Ashkenazi Jewish ancestry. There is no known consanguinity. ? ?GENETIC TEST RESULTS: Genetic testing reported out on August 31, 2021 through the Multi-cancer gene panel +RNA cancer panel found no pathogenic mutations. The Multi-Gene Panel offered by [facility] includes sequencing and/or deletion duplication testing of the following 84 genes: AIP, ALK, APC, ATM, AXIN2,BAP1,  BARD1, BLM, BMPR1A, BRCA1, BRCA2, BRIP1, CASR, CDC73, CDH1, CDK4, CDKN1B, CDKN1C, CDKN2A (p14ARF), CDKN2A (p16INK4a), CEBPA, CHEK2, CTNNA1, DICER1, DIS3L2, EGFR (c.2369C>T, p.Thr790Met variant only), EPCAM (Deletion/duplication testing only), FH, FLCN, GATA2, GPC3, GREM1 (Promoter region deletion/duplication testing only), HOXB13 (c.251G>A, p.Gly84Glu), HRAS, KIT, MAX, MEN1, MET, MITF (c.952G>A, p.Glu318Lys variant only), MLH1, MSH2, MSH3, MSH6, MUTYH, NBN, NF1, NF2, NTHL1, PALB2, PDGFRA, PHOX2B, PMS2, POLD1, POLE, POT1, PRKAR1A, PTCH1, PTEN, RAD50, RAD51C, RAD51D, RB1, RECQL4, RET, RUNX1, SDHAF2, SDHA (sequence changes only), SDHB, SDHC, SDHD, SMAD4, SMARCA4, SMARCB1, SMARCE1, STK11, SUFU, TERC,  TERT, TMEM127, TP53, TSC1, TSC2, VHL, WRN and WT1.. The test report has been scanned into EPIC and is located under the Molecular Pathology section of the Results Review tab.  A portion of the result report is included below for reference.  ? ? ? ? ?We discussed with Tiffany Vaughn that because current genetic testing is not perfect, it is possible there may be a gene mutation in one of these genes that current testing cannot detect, but that chance is small.  We also discussed,  that there could be another gene that has not yet been discovered, or that we have not yet tested, that is responsible for the cancer diagnoses in the family. It is also possible there is a hereditary cause for the cancer in the family that Tiffany Vaughn did not inherit and therefore was not identified in her testing.  Therefore, it is important to remain in touch with cancer genetics in the future so that we can continue to offer Tiffany Vaughn the most up to date genetic testing.  ? ?Genetic testing did identify two Variants of uncertain significance (VUS) - one in the BARD1 gene called c.1107A>T (p.Lys369Asn) and a second in the CHEK2 gene called c.1157G>A (p.Gly386Glu).  At this time, it is unknown if these variants are associated with increased cancer risk or if they are normal findings, but most variants such as these get reclassified to being inconsequential. They should not be used to make medical management decisions. With time, we suspect the lab will determine the significance of these variants, if any. If we do learn more about them, we will try to contact Tiffany Vaughn to discuss it further. However, it is important to stay in touch with [place] periodically and keep the address and phone number up to date. ? ?ADDITIONAL GENETIC TESTING: We discussed with Tiffany Vaughn that her genetic testing was fairly extensive.  If there are genes identified to increase cancer risk that can be analyzed in the future, we would be happy to discuss and coordinate this testing at that time.   ? ?CANCER SCREENING RECOMMENDATIONS: Tiffany Vaughn test result is considered negative (normal).  This means that we have not identified a hereditary cause for her personal and family history of cancer at this time. Most cancers happen by chance and this negative test suggests that her cancer may fall into this category.   ? ?While reassuring, this does not definitively rule out a hereditary predisposition to cancer. It is still possible that  there could be genetic mutations that are undetectable by current technology. There could be genetic mutations in genes that have not been tested or identified to increase cancer risk.  Therefore, it is recommended she continue to follow the cancer management and screening guidelines provided by her oncology and primary healthcare provider.  ? ?An individual's cancer risk and medical management are not determined by genetic test results alone. Overall cancer risk assessment incorporates additional factors, including personal medical history, family history, and any available genetic information that may result in a personalized plan for cancer prevention and surveillance ? ?RECOMMENDATIONS FOR FAMILY MEMBERS:  Individuals in this family might be at some increased risk of developing cancer, over the general population risk, simply due to the family history of cancer.  We recommended women in this family have a yearly mammogram beginning at age 6, or 87 years younger than the earliest onset of cancer, an annual clinical breast exam, and perform monthly breast self-exams. Women in this family should also  have a gynecological exam as recommended by their primary provider. All family members should be referred for colonoscopy starting at age 44. ? ?FOLLOW-UP: Lastly, we discussed with Tiffany Vaughn that cancer genetics is a rapidly advancing field and it is possible that new genetic tests will be appropriate for her and/or her family members in the future. We encouraged her to remain in contact with cancer genetics on an annual basis so we can update her personal and family histories and let her know of advances in cancer genetics that may benefit this family.  ? ?Our contact number was provided. Tiffany Vaughn questions were answered to her satisfaction, and she knows she is welcome to call us at anytime with additional questions or concerns.  ? ?Roma Kayser, MS, [place] ?Licensed, Mudlogger ?[email or web address] ? ?

## 2021-08-31 NOTE — Telephone Encounter (Signed)
Revealed negative genetic testing.  Discussed that we do not know why she has a history of breast cancer or why there is cancer in the family. It could be due to a different gene that we are not testing, or maybe our current technology may not be able to pick something up.  It will be important for her to keep in contact with genetics to keep up with whether additional testing may be needed.  ? ?Two VUS were identified that will not change medical management. ? ? ?

## 2021-09-04 ENCOUNTER — Other Ambulatory Visit: Payer: Self-pay

## 2021-09-04 ENCOUNTER — Inpatient Hospital Stay: Payer: Medicare HMO | Attending: Nurse Practitioner

## 2021-09-04 ENCOUNTER — Inpatient Hospital Stay: Payer: Medicare HMO | Admitting: Oncology

## 2021-09-04 VITALS — BP 120/65 | HR 77 | Temp 98.2°F | Resp 20 | Ht 64.0 in | Wt 128.6 lb

## 2021-09-04 DIAGNOSIS — E785 Hyperlipidemia, unspecified: Secondary | ICD-10-CM | POA: Diagnosis not present

## 2021-09-04 DIAGNOSIS — Z809 Family history of malignant neoplasm, unspecified: Secondary | ICD-10-CM | POA: Diagnosis not present

## 2021-09-04 DIAGNOSIS — Z8673 Personal history of transient ischemic attack (TIA), and cerebral infarction without residual deficits: Secondary | ICD-10-CM | POA: Diagnosis not present

## 2021-09-04 DIAGNOSIS — D75839 Thrombocytosis, unspecified: Secondary | ICD-10-CM | POA: Insufficient documentation

## 2021-09-04 DIAGNOSIS — E538 Deficiency of other specified B group vitamins: Secondary | ICD-10-CM | POA: Insufficient documentation

## 2021-09-04 DIAGNOSIS — Z803 Family history of malignant neoplasm of breast: Secondary | ICD-10-CM | POA: Diagnosis not present

## 2021-09-04 DIAGNOSIS — I10 Essential (primary) hypertension: Secondary | ICD-10-CM | POA: Insufficient documentation

## 2021-09-04 LAB — CBC WITH DIFFERENTIAL (CANCER CENTER ONLY)
Abs Immature Granulocytes: 0.03 10*3/uL (ref 0.00–0.07)
Basophils Absolute: 0 10*3/uL (ref 0.0–0.1)
Basophils Relative: 0 %
Eosinophils Absolute: 0.2 10*3/uL (ref 0.0–0.5)
Eosinophils Relative: 2 %
HCT: 46.9 % — ABNORMAL HIGH (ref 36.0–46.0)
Hemoglobin: 15 g/dL (ref 12.0–15.0)
Immature Granulocytes: 0 %
Lymphocytes Relative: 25 %
Lymphs Abs: 1.9 10*3/uL (ref 0.7–4.0)
MCH: 28.7 pg (ref 26.0–34.0)
MCHC: 32 g/dL (ref 30.0–36.0)
MCV: 89.7 fL (ref 80.0–100.0)
Monocytes Absolute: 0.6 10*3/uL (ref 0.1–1.0)
Monocytes Relative: 9 %
Neutro Abs: 4.8 10*3/uL (ref 1.7–7.7)
Neutrophils Relative %: 64 %
Platelet Count: 547 10*3/uL — ABNORMAL HIGH (ref 150–400)
RBC: 5.23 MIL/uL — ABNORMAL HIGH (ref 3.87–5.11)
RDW: 14.3 % (ref 11.5–15.5)
WBC Count: 7.5 10*3/uL (ref 4.0–10.5)
nRBC: 0 % (ref 0.0–0.2)

## 2021-09-04 MED ORDER — CYANOCOBALAMIN 1000 MCG PO TABS
1000.0000 ug | ORAL_TABLET | Freq: Every day | ORAL | 1 refills | Status: DC
Start: 2021-09-04 — End: 2021-10-02

## 2021-09-04 MED ORDER — CLOPIDOGREL BISULFATE 75 MG PO TABS: 75.0000 mg | ORAL_TABLET | Freq: Every day | ORAL | 1 refills | Status: DC

## 2021-09-04 MED ORDER — HYDROXYUREA 500 MG PO CAPS: ORAL_CAPSULE | ORAL | 0 refills | Status: DC

## 2021-09-04 NOTE — Progress Notes (Signed)
?  [facility] ?OFFICE PROGRESS NOTE ? ? ?Diagnosis: Thrombocytosis ? ?INTERVAL HISTORY:  ? ?Tiffany Vaughn returns as scheduled.  The JAK2 mutation returned positive on 08/10/2021.  No symptom of recurrent stroke or thrombosis.  She is taking Plavix. ? ? ?Objective: ? ?Vital signs in last 24 hours: ? ?Blood pressure 120/65, pulse 77, temperature 98.2 ?F (36.8 ?C), temperature source Oral, resp. rate 20, height '5\' 4"'$  (1.626 m), weight 128 lb 9.6 oz (58.3 kg), SpO2 98 %. ?  ? ? ?Resp: Lungs clear bilaterally ?Cardio: Regular rate and rhythm ?GI: No hepatosplenomegaly ?Vascular: No leg edema ? ?Lab Results: ? ?Lab Results  ?Component Value Date  ? WBC 7.5 09/04/2021  ? HGB 15.0 09/04/2021  ? HCT 46.9 (H) 09/04/2021  ? MCV 89.7 09/04/2021  ? PLT 547 (H) 09/04/2021  ? NEUTROABS 4.8 09/04/2021  ? ? ?CMP  ?Lab Results  ?Component Value Date  ? NA 138 07/30/2021  ? K 5.3 07/30/2021  ? CL 102 07/30/2021  ? CO2 29 07/30/2021  ? GLUCOSE 81 07/30/2021  ? BUN 16 07/30/2021  ? CREATININE 0.98 (H) 07/30/2021  ? CALCIUM 10.2 07/30/2021  ? PROT 7.2 07/30/2021  ? ALBUMIN 5.0 07/24/2021  ? AST 22 07/30/2021  ? ALT 21 07/30/2021  ? ALKPHOS 103 07/24/2021  ? BILITOT 0.6 07/30/2021  ? GFRNONAA 58 (L) 07/24/2021  ? GFRAA 60 10/02/2020  ? ? ?No results found for: CEA1, CEA, [id number], CA125 ? ?Lab Results  ?Component Value Date  ? INR 0.9 07/24/2021  ? LABPROT 12.3 07/24/2021  ? ? ?Imaging: ? ?No results found. ? ?Medications: I have reviewed the patient's current medications. ? ? ?Assessment/Plan: ? ?Essential thrombocytosis-positive JAK2p.Val617Phe mutation ?Hydroxyurea started 09/04/2021 ?Hemoglobin upper end of normal range ?Acute infarct left centrum semiovale 07/25/2021, currently on Plavix and aspirin for 3 weeks then Plavix alone ?B12 deficiency, on oral replacement  ?Remote history of right breast cancer, status post radiation, completed 5 years of tamoxifen ?History of transient global  amnesia ?Hypertension ?Hyperlipidemia ?Multiple family members with malignancy ? ? ?Disposition: ?Tiffany Vaughn has persistent thrombocytosis.  The JAK2 mutation returned positive.  She appears to have a myeloproliferative disorder, likely essential thrombocytosis versus polycythemia vera.  I discussed the diagnosis and indication for platelet lowering therapy with Tiffany Vaughn and her husband.  I recommend platelet lowering therapy due to her age and recent CVA.  She will continue Plavix as directed by neurology. ? ?We reviewed potential toxicities associated with hydroxyurea including the chance for mucositis, rash, hepatic toxicity, leukopenia, thrombocytopenia and anemia.  She agrees to proceed. ? ?She will begin hydroxyurea at a dose of 500 mg Monday, Wednesday, and Friday.  She will return for an office visit and CBC in 4 weeks. ? ?Tiffany Coder, MD ? ?09/04/2021  ?11:42 AM ? ? ?

## 2021-10-02 ENCOUNTER — Other Ambulatory Visit: Payer: Self-pay

## 2021-10-02 ENCOUNTER — Inpatient Hospital Stay: Payer: Medicare HMO | Admitting: Oncology

## 2021-10-02 ENCOUNTER — Inpatient Hospital Stay: Payer: Medicare HMO | Attending: Nurse Practitioner

## 2021-10-02 VITALS — BP 113/92 | HR 62 | Temp 98.0°F | Resp 16 | Wt 130.6 lb

## 2021-10-02 DIAGNOSIS — D75839 Thrombocytosis, unspecified: Secondary | ICD-10-CM

## 2021-10-02 DIAGNOSIS — Z7902 Long term (current) use of antithrombotics/antiplatelets: Secondary | ICD-10-CM | POA: Diagnosis not present

## 2021-10-02 DIAGNOSIS — Z923 Personal history of irradiation: Secondary | ICD-10-CM | POA: Diagnosis not present

## 2021-10-02 DIAGNOSIS — E785 Hyperlipidemia, unspecified: Secondary | ICD-10-CM | POA: Diagnosis not present

## 2021-10-02 DIAGNOSIS — Z7982 Long term (current) use of aspirin: Secondary | ICD-10-CM | POA: Diagnosis not present

## 2021-10-02 DIAGNOSIS — Z79899 Other long term (current) drug therapy: Secondary | ICD-10-CM | POA: Diagnosis not present

## 2021-10-02 DIAGNOSIS — E538 Deficiency of other specified B group vitamins: Secondary | ICD-10-CM | POA: Insufficient documentation

## 2021-10-02 DIAGNOSIS — Z853 Personal history of malignant neoplasm of breast: Secondary | ICD-10-CM | POA: Diagnosis not present

## 2021-10-02 DIAGNOSIS — I10 Essential (primary) hypertension: Secondary | ICD-10-CM | POA: Insufficient documentation

## 2021-10-02 LAB — CBC WITH DIFFERENTIAL (CANCER CENTER ONLY)
Abs Immature Granulocytes: 0.02 10*3/uL (ref 0.00–0.07)
Basophils Absolute: 0 10*3/uL (ref 0.0–0.1)
Basophils Relative: 0 %
Eosinophils Absolute: 0.2 10*3/uL (ref 0.0–0.5)
Eosinophils Relative: 3 %
HCT: 46.1 % — ABNORMAL HIGH (ref 36.0–46.0)
Hemoglobin: 14.7 g/dL (ref 12.0–15.0)
Immature Granulocytes: 0 %
Lymphocytes Relative: 29 %
Lymphs Abs: 2.2 10*3/uL (ref 0.7–4.0)
MCH: 28.8 pg (ref 26.0–34.0)
MCHC: 31.9 g/dL (ref 30.0–36.0)
MCV: 90.4 fL (ref 80.0–100.0)
Monocytes Absolute: 0.8 10*3/uL (ref 0.1–1.0)
Monocytes Relative: 10 %
Neutro Abs: 4.2 10*3/uL (ref 1.7–7.7)
Neutrophils Relative %: 58 %
Platelet Count: 479 10*3/uL — ABNORMAL HIGH (ref 150–400)
RBC: 5.1 MIL/uL (ref 3.87–5.11)
RDW: 15.4 % (ref 11.5–15.5)
WBC Count: 7.4 10*3/uL (ref 4.0–10.5)
nRBC: 0 % (ref 0.0–0.2)

## 2021-10-02 MED ORDER — HYDROXYUREA 500 MG PO CAPS: ORAL_CAPSULE | ORAL | 0 refills | Status: DC

## 2021-10-02 MED ORDER — CYANOCOBALAMIN 1000 MCG PO TABS: 1000.0000 ug | ORAL_TABLET | Freq: Every day | ORAL | 5 refills | Status: AC

## 2021-10-02 NOTE — Progress Notes (Signed)
?  [facility] ?OFFICE PROGRESS NOTE ? ? ?Diagnosis: Essential thrombocytosis ? ?INTERVAL HISTORY:  ? ?Tiffany Vaughn returns as scheduled.  She started hydroxyurea 1 month ago.  No bleeding or symptom of thrombosis.  No mouth sores, nausea, or rash.  No complaint. ? ?Objective: ? ?Vital signs in last 24 hours: ? ?Blood pressure (!) 113/92, pulse 62, temperature 98 ?F (36.7 ?C), temperature source Oral, resp. rate 16, weight 130 lb 9.6 oz (59.2 kg), SpO2 97 %. ?  ? ?HEENT: No thrush or ulcers ?Resp: Lungs clear bilaterally ?Cardio: Regular rate and rhythm ?GI: No hepatosplenomegaly ?Vascular: No leg edema ? ? ?Lab Results: ? ?Lab Results  ?Component Value Date  ? WBC 7.4 10/02/2021  ? HGB 14.7 10/02/2021  ? HCT 46.1 (H) 10/02/2021  ? MCV 90.4 10/02/2021  ? PLT 479 (H) 10/02/2021  ? NEUTROABS 4.2 10/02/2021  ? ? ?CMP  ?Lab Results  ?Component Value Date  ? NA 138 07/30/2021  ? K 5.3 07/30/2021  ? CL 102 07/30/2021  ? CO2 29 07/30/2021  ? GLUCOSE 81 07/30/2021  ? BUN 16 07/30/2021  ? CREATININE 0.98 (H) 07/30/2021  ? CALCIUM 10.2 07/30/2021  ? PROT 7.2 07/30/2021  ? ALBUMIN 5.0 07/24/2021  ? AST 22 07/30/2021  ? ALT 21 07/30/2021  ? ALKPHOS 103 07/24/2021  ? BILITOT 0.6 07/30/2021  ? GFRNONAA 58 (L) 07/24/2021  ? GFRAA 60 10/02/2020  ? ? ? ?Medications: I have reviewed the patient's current medications. ? ? ?Assessment/Plan: ?Essential thrombocytosis-positive JAK2p.Val617Phe mutation ?Hydroxyurea started 09/04/2021 ?Hydroxyurea increased to 500 mg Monday through Friday, 10/02/2021 ?Hemoglobin upper end of normal range ?Acute infarct left centrum semiovale 07/25/2021, currently on Plavix and aspirin for 3 weeks then Plavix alone ?B12 deficiency, on oral replacement  ?Remote history of right breast cancer, status post radiation, completed 5 years of tamoxifen ?History of transient global amnesia ?Hypertension ?Hyperlipidemia ?Multiple family members with malignancy ? ? ? ? ?Disposition: ?Tiffany Vaughn appears  stable.  She is tolerating hydroxyurea well.  The platelet count is slightly improved, but remains above goal range.  The hydroxyurea will be increased to 500 mg daily on Monday through Friday.  She will return for an office visit and CBC in 1 month. ? ?Betsy Coder, MD ? ?10/02/2021  ?10:59 AM ? ? ?

## 2021-10-05 ENCOUNTER — Encounter: Payer: Self-pay | Admitting: Internal Medicine

## 2021-10-05 ENCOUNTER — Encounter: Payer: Medicare HMO | Admitting: Internal Medicine

## 2021-10-14 ENCOUNTER — Other Ambulatory Visit: Payer: Self-pay | Admitting: Oncology

## 2021-10-19 ENCOUNTER — Encounter: Payer: Medicare HMO | Admitting: Nurse Practitioner

## 2021-10-24 ENCOUNTER — Other Ambulatory Visit: Payer: Self-pay | Admitting: Oncology

## 2021-10-28 ENCOUNTER — Encounter: Payer: Self-pay | Admitting: Nurse Practitioner

## 2021-10-28 ENCOUNTER — Inpatient Hospital Stay: Payer: Medicare HMO | Admitting: Nurse Practitioner

## 2021-10-28 ENCOUNTER — Encounter: Payer: Medicare HMO | Admitting: Adult Health

## 2021-10-28 ENCOUNTER — Inpatient Hospital Stay: Payer: Medicare HMO | Attending: Nurse Practitioner

## 2021-10-28 VITALS — BP 140/83 | HR 60 | Temp 97.8°F | Resp 18 | Ht 64.0 in | Wt 130.8 lb

## 2021-10-28 DIAGNOSIS — E785 Hyperlipidemia, unspecified: Secondary | ICD-10-CM | POA: Insufficient documentation

## 2021-10-28 DIAGNOSIS — I10 Essential (primary) hypertension: Secondary | ICD-10-CM | POA: Diagnosis not present

## 2021-10-28 DIAGNOSIS — D473 Essential (hemorrhagic) thrombocythemia: Secondary | ICD-10-CM | POA: Diagnosis not present

## 2021-10-28 DIAGNOSIS — E538 Deficiency of other specified B group vitamins: Secondary | ICD-10-CM | POA: Insufficient documentation

## 2021-10-28 DIAGNOSIS — D75839 Thrombocytosis, unspecified: Secondary | ICD-10-CM

## 2021-10-28 LAB — CBC WITH DIFFERENTIAL (CANCER CENTER ONLY)
Abs Immature Granulocytes: 0.01 10*3/uL (ref 0.00–0.07)
Basophils Absolute: 0 10*3/uL (ref 0.0–0.1)
Basophils Relative: 0 %
Eosinophils Absolute: 0.1 10*3/uL (ref 0.0–0.5)
Eosinophils Relative: 2 %
HCT: 42.5 % (ref 36.0–46.0)
Hemoglobin: 13.5 g/dL (ref 12.0–15.0)
Immature Granulocytes: 0 %
Lymphocytes Relative: 24 %
Lymphs Abs: 1.7 10*3/uL (ref 0.7–4.0)
MCH: 29.1 pg (ref 26.0–34.0)
MCHC: 31.8 g/dL (ref 30.0–36.0)
MCV: 91.6 fL (ref 80.0–100.0)
Monocytes Absolute: 0.7 10*3/uL (ref 0.1–1.0)
Monocytes Relative: 10 %
Neutro Abs: 4.3 10*3/uL (ref 1.7–7.7)
Neutrophils Relative %: 64 %
Platelet Count: 422 10*3/uL — ABNORMAL HIGH (ref 150–400)
RBC: 4.64 MIL/uL (ref 3.87–5.11)
RDW: 17.1 % — ABNORMAL HIGH (ref 11.5–15.5)
WBC Count: 6.8 10*3/uL (ref 4.0–10.5)
nRBC: 0 % (ref 0.0–0.2)

## 2021-10-28 NOTE — Progress Notes (Signed)
?  [facility] ?OFFICE PROGRESS NOTE ? ? ?Diagnosis: Essential thrombocytosis ? ?INTERVAL HISTORY:  ? ?Ms. Horstman returns as scheduled.  She continues hydroxyurea.  No bleeding or symptom of thrombosis.  No nausea.  No mouth sores.  No diarrhea.  No rash. ? ?Objective: ? ?Vital signs in last 24 hours: ? ?Blood pressure 140/83, pulse 60, temperature 97.8 ?F (36.6 ?C), temperature source Oral, resp. rate 18, height '5\' 4"'$  (1.626 m), weight 130 lb 12.8 oz (59.3 kg), SpO2 100 %. ?  ? ?HEENT: No thrush or ulcers. ?Resp: Lungs clear bilaterally. ?Cardio: Regular rate and rhythm. ?GI: No hepatosplenomegaly. ?Vascular: No leg edema. ?Skin: No rash. ? ? ?Lab Results: ? ?Lab Results  ?Component Value Date  ? WBC 6.8 10/28/2021  ? HGB 13.5 10/28/2021  ? HCT 42.5 10/28/2021  ? MCV 91.6 10/28/2021  ? PLT 422 (H) 10/28/2021  ? NEUTROABS 4.3 10/28/2021  ? ? ?Imaging: ? ?No results found. ? ?Medications: I have reviewed the patient's current medications. ? ?Assessment/Plan: ?Essential thrombocytosis-positive JAK2p.Val617Phe mutation ?Hydroxyurea started 09/04/2021 ?Hydroxyurea increased to 500 mg Monday through Friday, 10/02/2021 ?Hydroxyurea continued 500 mg Monday through Friday, 10/28/2021 ?Hemoglobin upper end of normal range ?Acute infarct left centrum semiovale 07/25/2021, currently on Plavix and aspirin for 3 weeks then Plavix alone ?B12 deficiency, on oral replacement  ?Remote history of right breast cancer, status post radiation, completed 5 years of tamoxifen ?History of transient global amnesia ?Hypertension ?Hyperlipidemia ?Multiple family members with malignancy ? ?Disposition: Ms. Gulas appears stable.  She seems to be tolerating the hydroxyurea well.  The platelet count is approaching goal range.  She will continue hydroxyurea 500 mg Monday through Friday.  Plan for repeat CBC and follow-up appointment in 4 weeks. ? ? ? ?Ned Card ANP/GNP-BC  ? ?10/28/2021  ?10:13 AM ? ? ? ? ? ? ? ?

## 2021-11-16 NOTE — Progress Notes (Unsigned)
MEDICARE ANNUAL WELLNESS VISIT AND 3 MONTH FOLLOW UP  Assessment:   Encounter for general adult medical exam with abnormal findings Due annually   Aortic Atherosclerosis(HCC) per CT 2020 Control blood pressure, blood sugar, weight and cholesterol  Essential hypertension - at goal, continue medications, DASH diet, exercise and monitor at home. Call if greater than 130/80.    Hyperlipidemia -above goal, increase atorvastatin to 40 mg daily  - LDL goal <100 - check lipids, decrease fatty foods, increase activity.   Vitamin D deficiency At goal at recent check;  Defer vitamin D level   Depression, remission ([place]) In remission off of medications   History of cancer of right breast Continue close follow ups, mammogram annually  Osteopenia Continue high calcium diet, vitamin D supplementation, high impact/weight bearing exercises Repeat dexa with next mammogram  Seasonal allergies Continue OTC allergy pills  Other abnormal glucose -     Hemoglobin A1c q69m defer as was checked last visit, check CMP for serum glucose Discussed general issues about diabetes pathophysiology and management., Educational material distributed., Suggested low cholesterol diet., Encouraged aerobic exercise., Discussed foot care., Reminded to get yearly retinal exam.  BMI 22 Continue to recommend diet heavy in fruits and veggies and low in animal meats, cheeses, and dairy products, appropriate calorie intake Discuss exercise recommendations routinely Continue to monitor weight at each visit  CKD 3a ([place] Increase fluids, avoid NSAIDS, monitor sugars, will monitor    Future Appointments  Date Time Provider [place] 11/17/2021 10:00 AM MMagda Bernheim NP [facility] None  11/25/2021  9:15 AM [facility] PHLEBOTOMIST [facility] None  11/25/2021  9:45 AM TOwens Shark NP [facility] None  04/06/2022 11:00 AM CDarrol Jump NP [facility] None  08/18/2022 10:15 AM CAlric Ran MD [facility] None       Subjective:   Tiffany HIERSis a 81y.o. female who presents for Medicare Annual Wellness Visit and follow up for HTN, chol, weight, vitamin D def. In May 2020,  patient was hospitalized to r/o CVA and was diagnosed with TGA and Brain scans were negative.  Was on Evista for history of previous right breast cancer 30 years ago but was recently discontinued. Continues with annual mammograms.   She has hx of depression in remission off of medication for may years.    BMI is There is no height or weight on file to calculate BMI., she has been working on diet, is active, walking 1 mile daily. She is cutting carbs Wt Readings from Last 3 Encounters:  10/28/21 130 lb 12.8 oz (59.3 kg)  10/02/21 130 lb 9.6 oz (59.2 kg)  09/04/21 128 lb 9.6 oz (58.3 kg)   Her blood pressure has been controlled at home, today their BP is   She does workout, she is on toprol for HA. She denies chest pain, shortness of breath, dizziness (was having positional, improved with increased fluid).   She is on cholesterol medication, lipitor 20 mg daily Her cholesterol is at goal. The cholesterol last visit was:   Lab Results  Component Value Date   CHOL 196 06/30/2021   HDL 56 06/30/2021   LDLCALC 117 (H) 06/30/2021   TRIG 122 06/30/2021   CHOLHDL 3.5 06/30/2021    She has been working on diet and exercise for prediabetes, and denies foot ulcerations, increased appetite, nausea, paresthesia of the feet, polydipsia, polyuria, visual disturbances, vomiting and weight loss. Last A1C in the office was:  Lab Results  Component Value Date   HGBA1C  6.2 (H) 06/30/2021   She has CKD 3a monitored at this office. Last GFR:  Lab Results  Component Value Date   GFRNONAA 58 (L) 07/24/2021   Patient is on Vitamin D supplement.   Lab Results  Component Value Date   VD25OH 79 10/02/2020        Medication Review Current Outpatient Medications on File Prior to Visit  Medication Sig Dispense Refill   Ascorbic  Acid (VITAMIN C PO) Take 500 mg by mouth daily.     atorvastatin (LIPITOR) 40 MG tablet TAKE 1 TAB DAILY FOR CHOLESTEROL. 90 tablet 1   Cholecalciferol (VITAMIN D PO) Take 5,000 Units by mouth daily.      clopidogrel (PLAVIX) 75 MG tablet Take 1 tablet (75 mg total) by mouth daily. ALL FUTURE REFILLS PER NEUROLOGY 30 tablet 0   cyanocobalamin 1000 MCG tablet Take 1 tablet (1,000 mcg total) by mouth daily. 30 tablet 5   desonide (DESOWEN) 0.05 % cream Apply topically 2 (two) times daily. (Patient taking differently: Apply 1 application. topically 2 (two) times daily as needed (dry skin).) 30 g 0   hydroxyurea (HYDREA) 500 MG capsule Take 1 capsule (500 mg total) by mouth as directed. TAKE 1 CAPSULE BY MOUTH MONDAY THROUGH FRIDAY, MAY TAKE WITH FOOD TO MINIMIZE GI SIDE EFFECTS. 30 capsule 0   metoprolol succinate (TOPROL-XL) 25 MG 24 hr tablet TAKE 1 TABLET DAILY FOR BLOOD PRESSURE (Patient taking differently: 25 mg at bedtime.) 90 tablet 3   Omega-3 Fatty Acids (FISH OIL PO) Take 1,000 mg by mouth daily.     Probiotic Product (PROBIOTIC PO) Take 250 mg by mouth daily.     sertraline (ZOLOFT) 25 MG tablet Take 1 tablet (25 mg total) by mouth daily. (Patient not taking: Reported on 10/28/2021) 30 tablet 2   No current facility-administered medications on file prior to visit.    Current Problems (verified) Patient Active Problem List   Diagnosis Date Noted   Genetic testing 08/31/2021   Family history of breast cancer 08/12/2021   Family history of leukemia 08/12/2021   Global amnesia 07/24/2021   Aortic atherosclerosis ([place]) 08/28/2020   BMI 22.0-22.9, adult 06/13/2020   CKD (chronic kidney disease) stage 3, GFR 30-59 ml/min (HCC) 11/02/2019   Seasonal and perennial allergic rhinitis 08/31/2019   Ptosis of eyelid 09/12/2017   Osteopenia 12/23/2014   Medication management 10/24/2013   Hyperlipidemia    Hypertension    Vitamin D deficiency    Depression, major, in remission ([place])    History  of right breast cancer     Screening Tests Immunization History  Administered Date(s) Administered   DT (Pediatric) 12/23/2014   Influenza, High Dose Seasonal PF 03/31/2019   Influenza-Unspecified 04/02/2016, 03/16/2018, 04/27/2020, 05/27/2021   PFIZER(Purple Top)SARS-COV-2 Vaccination 07/13/2019, 08/08/2019, 04/17/2020   Pneumococcal Conjugate-13 12/17/2013   Pneumococcal Polysaccharide-23 10/23/2009   Td 04/15/2005   Zoster, Live 11/19/2010   Preventative care: Last colonoscopy: 05/2014  Dr. Mary Sella, normal, 5 year follow up, hx of polyps but not precancerous, prefers to do cologuard Cologuard: 10/2019 neg DONE Last mammogram: 06/2020 Last pap smear/pelvic exam: 2009 remote DEXA: 06/2018, t -1.8, osteopenia - ORDER PLACED Cardiolite 2011 negative EF 65%   Prior vaccinations: TD: 2016  Influenza: 03/2020 Pneumococcal: 2011 Prevnar 13: 2015 Shingles/Zostavax: 2012 - ask insurance about shingrix Covid: 2/2, 2021, pfizer + booster  Names of Other Physician/Practitioners you currently use: 1. [facility]- here for primary care 2. Dr. Everlene Farrier, eye doctor,  last visit seeing 2020, looking for new provider  3. Dr. Deanna Artis, dentist, last visit q 6 months 2022  Patient Care Team: Unk Pinto, MD as PCP - General (Internal Medicine) Irene Shipper, MD as Consulting Physician (Gastroenterology)   Allergies Allergies  Allergen Reactions   Codeine Nausea And Vomiting    "Feels like I have the flu"   SURGICAL HISTORY She  has a past surgical history that includes Tonsillectomy and adenoidectomy; Abdominal hysterectomy; Breast lumpectomy (Right); and Cataract extraction, bilateral (Bilateral, 2016). FAMILY HISTORY Her family history includes ALS in her brother; Breast cancer in her paternal grandfather; Breast cancer (age of onset: 91) in her niece; Cancer in her maternal aunt; Heart attack in her brother and maternal grandfather; Heart  attack (age of onset: 1) in her mother; Heart attack (age of onset: 33) in her father; Leukemia in her brother and paternal grandmother; Leukemia (age of onset: 5) in some other family members; Stomach cancer in her maternal grandmother. SOCIAL HISTORY She  reports that she has never smoked. She has never used smokeless tobacco. She reports that she does not drink alcohol and does not use drugs.   Review of Systems  Constitutional:  Negative for chills and fever.  HENT:  Negative for congestion, hearing loss, sinus pain, sore throat and tinnitus.   Eyes:  Negative for blurred vision and double vision.  Respiratory:  Negative for cough, hemoptysis, sputum production, shortness of breath and wheezing.   Cardiovascular:  Negative for chest pain, palpitations and leg swelling.  Gastrointestinal:  Negative for abdominal pain, constipation, diarrhea, heartburn, nausea and vomiting.  Genitourinary:  Negative for dysuria and urgency.  Musculoskeletal:  Negative for back pain, falls, joint pain, myalgias and neck pain.  Skin:  Negative for rash.  Neurological:  Negative for dizziness, tingling, tremors, weakness and headaches.  Endo/Heme/Allergies:  Does not bruise/bleed easily.  Psychiatric/Behavioral:  Negative for depression and suicidal ideas. The patient is not nervous/anxious and does not have insomnia.     Objective:     There were no vitals filed for this visit.  There is no height or weight on file to calculate BMI.  General Appearance: Well nourished, in no apparent distress. Eyes: PERRLA, EOMs, conjunctiva no swelling or erythema Sinuses: No Frontal/maxillary tenderness ENT/Mouth: Ext aud canals with dry, erythematous flaky skin, TMs without erythema, bulging. No erythema, swelling, or exudate on post pharynx.  Tonsils not swollen or erythematous. Hearing normal.  Neck: Supple, thyroid normal.  Respiratory: Respiratory effort normal, BS equal bilaterally without rales, rhonchi,  wheezing or stridor.  Cardio: RRR with no MRGs. Brisk peripheral pulses without edema.  Abdomen: Soft, + BS.  Non tender, no guarding, rebound, hernias, masses. Lymphatics: Non tender without lymphadenopathy.  Musculoskeletal: Full ROM, 5/5 strength, Normal gait Skin: Warm, dry without rashes, lesions, ecchymosis.  Neuro: Cranial nerves intact. No cerebellar symptoms.  Psych: Awake and oriented X 3, normal affect, Insight and Judgment appropriate.      Magda Bernheim, NP   11/16/2021

## 2021-11-17 ENCOUNTER — Ambulatory Visit (INDEPENDENT_AMBULATORY_CARE_PROVIDER_SITE_OTHER): Payer: Medicare HMO | Admitting: Nurse Practitioner

## 2021-11-17 ENCOUNTER — Encounter: Payer: Self-pay | Admitting: Nurse Practitioner

## 2021-11-17 VITALS — BP 104/60 | HR 64 | Temp 97.9°F | Ht 64.5 in | Wt 130.6 lb

## 2021-11-17 DIAGNOSIS — Z136 Encounter for screening for cardiovascular disorders: Secondary | ICD-10-CM | POA: Diagnosis not present

## 2021-11-17 DIAGNOSIS — Z1389 Encounter for screening for other disorder: Secondary | ICD-10-CM

## 2021-11-17 DIAGNOSIS — D75839 Thrombocytosis, unspecified: Secondary | ICD-10-CM

## 2021-11-17 DIAGNOSIS — Z0000 Encounter for general adult medical examination without abnormal findings: Secondary | ICD-10-CM

## 2021-11-17 DIAGNOSIS — Z1329 Encounter for screening for other suspected endocrine disorder: Secondary | ICD-10-CM

## 2021-11-17 DIAGNOSIS — Z79899 Other long term (current) drug therapy: Secondary | ICD-10-CM | POA: Diagnosis not present

## 2021-11-17 DIAGNOSIS — E782 Mixed hyperlipidemia: Secondary | ICD-10-CM | POA: Diagnosis not present

## 2021-11-17 DIAGNOSIS — J302 Other seasonal allergic rhinitis: Secondary | ICD-10-CM

## 2021-11-17 DIAGNOSIS — E559 Vitamin D deficiency, unspecified: Secondary | ICD-10-CM | POA: Diagnosis not present

## 2021-11-17 DIAGNOSIS — I10 Essential (primary) hypertension: Secondary | ICD-10-CM

## 2021-11-17 DIAGNOSIS — I700 Atherosclerosis of aorta: Secondary | ICD-10-CM

## 2021-11-17 DIAGNOSIS — R7309 Other abnormal glucose: Secondary | ICD-10-CM

## 2021-11-17 DIAGNOSIS — Z853 Personal history of malignant neoplasm of breast: Secondary | ICD-10-CM

## 2021-11-17 DIAGNOSIS — F325 Major depressive disorder, single episode, in full remission: Secondary | ICD-10-CM

## 2021-11-17 DIAGNOSIS — N1831 Chronic kidney disease, stage 3a: Secondary | ICD-10-CM

## 2021-11-17 DIAGNOSIS — M8580 Other specified disorders of bone density and structure, unspecified site: Secondary | ICD-10-CM

## 2021-11-17 DIAGNOSIS — Z0001 Encounter for general adult medical examination with abnormal findings: Secondary | ICD-10-CM

## 2021-11-17 DIAGNOSIS — Z6822 Body mass index (BMI) 22.0-22.9, adult: Secondary | ICD-10-CM

## 2021-11-18 LAB — COMPLETE METABOLIC PANEL WITH GFR
AG Ratio: 1.8 (calc) (ref 1.0–2.5)
ALT: 23 U/L (ref 6–29)
AST: 25 U/L (ref 10–35)
Albumin: 4.4 g/dL (ref 3.6–5.1)
Alkaline phosphatase (APISO): 115 U/L (ref 37–153)
BUN: 21 mg/dL (ref 7–25)
CO2: 29 mmol/L (ref 20–32)
Calcium: 9.7 mg/dL (ref 8.6–10.4)
Chloride: 106 mmol/L (ref 98–110)
Creat: 0.9 mg/dL (ref 0.60–0.95)
Globulin: 2.4 g/dL (calc) (ref 1.9–3.7)
Glucose, Bld: 96 mg/dL (ref 65–99)
Potassium: 4.6 mmol/L (ref 3.5–5.3)
Sodium: 143 mmol/L (ref 135–146)
Total Bilirubin: 0.6 mg/dL (ref 0.2–1.2)
Total Protein: 6.8 g/dL (ref 6.1–8.1)
eGFR: 64 mL/min/{1.73_m2} (ref >=60)

## 2021-11-18 LAB — CBC WITH DIFFERENTIAL/PLATELET
Absolute Monocytes: 611 cells/uL (ref 200–950)
Basophils Absolute: 21 cells/uL (ref 0–200)
Basophils Relative: 0.3 %
Eosinophils Absolute: 128 cells/uL (ref 15–500)
Eosinophils Relative: 1.8 %
HCT: 44 % (ref 35.0–45.0)
Hemoglobin: 15 g/dL (ref 11.7–15.5)
Lymphs Abs: 1534 cells/uL (ref 850–3900)
MCH: 31.3 pg (ref 27.0–33.0)
MCHC: 34.1 g/dL (ref 32.0–36.0)
MCV: 91.9 fL (ref 80.0–100.0)
MPV: 10.5 fL (ref 7.5–12.5)
Monocytes Relative: 8.6 %
Neutro Abs: 4807 cells/uL (ref 1500–7800)
Neutrophils Relative %: 67.7 %
Platelets: 419 10*3/uL — ABNORMAL HIGH (ref 140–400)
RBC: 4.79 10*6/uL (ref 3.80–5.10)
RDW: 16.4 % — ABNORMAL HIGH (ref 11.0–15.0)
Total Lymphocyte: 21.6 %
WBC: 7.1 10*3/uL (ref 3.8–10.8)

## 2021-11-18 LAB — URINALYSIS, ROUTINE W REFLEX MICROSCOPIC
Bacteria, UA: NONE SEEN /HPF
Bilirubin Urine: NEGATIVE
Glucose, UA: NEGATIVE
Hgb urine dipstick: NEGATIVE
Hyaline Cast: NONE SEEN /LPF
Ketones, ur: NEGATIVE
Nitrite: NEGATIVE
Protein, ur: NEGATIVE
RBC / HPF: NONE SEEN /HPF (ref 0–2)
Specific Gravity, Urine: 1.023 (ref 1.001–1.035)
Squamous Epithelial / HPF: NONE SEEN /HPF (ref <=5)
pH: 5.5 (ref 5.0–8.0)

## 2021-11-18 LAB — MICROSCOPIC MESSAGE

## 2021-11-18 LAB — MICROALBUMIN / CREATININE URINE RATIO
Creatinine, Urine: 142 mg/dL (ref 20–275)
Microalb Creat Ratio: 2 mcg/mg creat (ref <30)
Microalb, Ur: 0.3 mg/dL

## 2021-11-18 LAB — LIPID PANEL
Cholesterol: 184 mg/dL (ref <200)
HDL: 47 mg/dL — ABNORMAL LOW (ref >=50)
LDL Cholesterol (Calc): 116 mg/dL (calc) — ABNORMAL HIGH
Non-HDL Cholesterol (Calc): 137 mg/dL (calc) — ABNORMAL HIGH (ref <130)
Total CHOL/HDL Ratio: 3.9 (calc) (ref <5.0)
Triglycerides: 104 mg/dL (ref <150)

## 2021-11-18 LAB — HEMOGLOBIN A1C
Hgb A1c MFr Bld: 6 % of total Hgb — ABNORMAL HIGH (ref <5.7)
Mean Plasma Glucose: 126 mg/dL
eAG (mmol/L): 7 mmol/L

## 2021-11-18 LAB — TSH: TSH: 1.23 mIU/L (ref 0.40–4.50)

## 2021-11-18 LAB — MAGNESIUM: Magnesium: 2 mg/dL (ref 1.5–2.5)

## 2021-11-18 LAB — VITAMIN D 25 HYDROXY (VIT D DEFICIENCY, FRACTURES): Vit D, 25-Hydroxy: 72 ng/mL (ref 30–100)

## 2021-11-22 ENCOUNTER — Other Ambulatory Visit: Payer: Self-pay | Admitting: Adult Health

## 2021-11-22 DIAGNOSIS — I10 Essential (primary) hypertension: Secondary | ICD-10-CM

## 2021-11-25 ENCOUNTER — Encounter: Payer: Self-pay | Admitting: Nurse Practitioner

## 2021-11-25 ENCOUNTER — Inpatient Hospital Stay: Payer: Medicare HMO | Admitting: Nurse Practitioner

## 2021-11-25 ENCOUNTER — Inpatient Hospital Stay: Payer: Medicare HMO

## 2021-11-25 VITALS — BP 106/67 | HR 73 | Temp 98.1°F | Resp 18 | Ht 64.5 in | Wt 126.2 lb

## 2021-11-25 DIAGNOSIS — E538 Deficiency of other specified B group vitamins: Secondary | ICD-10-CM | POA: Diagnosis not present

## 2021-11-25 DIAGNOSIS — D75839 Thrombocytosis, unspecified: Secondary | ICD-10-CM

## 2021-11-25 DIAGNOSIS — I10 Essential (primary) hypertension: Secondary | ICD-10-CM | POA: Diagnosis not present

## 2021-11-25 DIAGNOSIS — E785 Hyperlipidemia, unspecified: Secondary | ICD-10-CM | POA: Diagnosis not present

## 2021-11-25 DIAGNOSIS — D473 Essential (hemorrhagic) thrombocythemia: Secondary | ICD-10-CM | POA: Diagnosis not present

## 2021-11-25 LAB — CBC WITH DIFFERENTIAL (CANCER CENTER ONLY)
Abs Immature Granulocytes: 0.02 10*3/uL (ref 0.00–0.07)
Basophils Absolute: 0 10*3/uL (ref 0.0–0.1)
Basophils Relative: 1 %
Eosinophils Absolute: 0.1 10*3/uL (ref 0.0–0.5)
Eosinophils Relative: 1 %
HCT: 44.7 % (ref 36.0–46.0)
Hemoglobin: 14.8 g/dL (ref 12.0–15.0)
Immature Granulocytes: 0 %
Lymphocytes Relative: 24 %
Lymphs Abs: 1.8 10*3/uL (ref 0.7–4.0)
MCH: 30.3 pg (ref 26.0–34.0)
MCHC: 33.1 g/dL (ref 30.0–36.0)
MCV: 91.6 fL (ref 80.0–100.0)
Monocytes Absolute: 0.7 10*3/uL (ref 0.1–1.0)
Monocytes Relative: 9 %
Neutro Abs: 5 10*3/uL (ref 1.7–7.7)
Neutrophils Relative %: 65 %
Platelet Count: 428 10*3/uL — ABNORMAL HIGH (ref 150–400)
RBC: 4.88 MIL/uL (ref 3.87–5.11)
RDW: 17.1 % — ABNORMAL HIGH (ref 11.5–15.5)
WBC Count: 7.7 10*3/uL (ref 4.0–10.5)
nRBC: 0 % (ref 0.0–0.2)

## 2021-11-25 MED ORDER — HYDROXYUREA 500 MG PO CAPS: 500.0000 mg | ORAL_CAPSULE | ORAL | 0 refills | Status: DC

## 2021-11-25 NOTE — Progress Notes (Signed)
  [facility] OFFICE PROGRESS NOTE   Diagnosis: Essential thrombocytosis  INTERVAL HISTORY:   Tiffany Vaughn returns as scheduled.  She continues hydroxyurea.  No bleeding.  No symptom of thrombosis.  She denies nausea/vomiting.  No mouth sores.  No diarrhea.  No rash.  Objective:  Vital signs in last 24 hours:  Blood pressure 106/67, pulse 73, temperature 98.1 F (36.7 C), temperature source Oral, resp. rate 18, height 5' 4.5" (1.638 m), weight 126 lb 3.2 oz (57.2 kg), SpO2 100 %.    HEENT: No thrush or ulcers. Resp: Lungs clear bilaterally. Cardio: Regular rate and rhythm. GI: No hepatosplenomegaly. Vascular: No leg edema. Skin: No rash.   Lab Results:  Lab Results  Component Value Date   WBC 7.7 11/25/2021   HGB 14.8 11/25/2021   HCT 44.7 11/25/2021   MCV 91.6 11/25/2021   PLT 428 (H) 11/25/2021   NEUTROABS 5.0 11/25/2021    Imaging:  No results found.  Medications: I have reviewed the patient's current medications.  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 Hydroxyurea started 09/04/2021 Hydroxyurea increased to 500 mg Monday through Friday, 10/02/2021 Hydroxyurea continued 500 mg Monday through Friday, 10/28/2021 Hydroxyurea increased to 500 mg daily 11/25/2021 Hemoglobin upper end of normal range Acute infarct left centrum semiovale 07/25/2021, currently on Plavix and aspirin for 3 weeks then Plavix alone B12 deficiency, on oral replacement  Remote history of right breast cancer, status post radiation, completed 5 years of tamoxifen History of transient global amnesia Hypertension Hyperlipidemia Multiple family members with malignancy  Disposition: Tiffany Vaughn appears stable.  CBC from today reviewed.  The platelet count remains above goal range.  She will increase hydroxyurea from 500 mg Monday through Friday to 500 mg daily.  She will return for lab and follow-up in 3 to 4 weeks.  We are available to see her  sooner if needed.    Ned Card ANP/GNP-BC   11/25/2021  10:29 AM

## 2021-11-28 ENCOUNTER — Other Ambulatory Visit: Payer: Self-pay | Admitting: Oncology

## 2021-11-28 DIAGNOSIS — D75839 Thrombocytosis, unspecified: Secondary | ICD-10-CM

## 2021-12-24 ENCOUNTER — Inpatient Hospital Stay: Payer: Medicare HMO

## 2021-12-24 ENCOUNTER — Inpatient Hospital Stay: Payer: Medicare HMO | Admitting: Oncology

## 2022-01-21 ENCOUNTER — Inpatient Hospital Stay: Payer: Medicare HMO | Attending: Nurse Practitioner

## 2022-01-21 ENCOUNTER — Inpatient Hospital Stay: Payer: Medicare HMO | Admitting: Oncology

## 2022-01-21 VITALS — BP 116/71 | HR 68 | Temp 98.2°F | Resp 18 | Ht 64.5 in | Wt 128.2 lb

## 2022-01-21 DIAGNOSIS — R140 Abdominal distension (gaseous): Secondary | ICD-10-CM | POA: Diagnosis not present

## 2022-01-21 DIAGNOSIS — I10 Essential (primary) hypertension: Secondary | ICD-10-CM | POA: Diagnosis not present

## 2022-01-21 DIAGNOSIS — K5909 Other constipation: Secondary | ICD-10-CM | POA: Diagnosis not present

## 2022-01-21 DIAGNOSIS — E785 Hyperlipidemia, unspecified: Secondary | ICD-10-CM | POA: Diagnosis not present

## 2022-01-21 DIAGNOSIS — Z7902 Long term (current) use of antithrombotics/antiplatelets: Secondary | ICD-10-CM | POA: Diagnosis not present

## 2022-01-21 DIAGNOSIS — Z7982 Long term (current) use of aspirin: Secondary | ICD-10-CM | POA: Insufficient documentation

## 2022-01-21 DIAGNOSIS — D75839 Thrombocytosis, unspecified: Secondary | ICD-10-CM | POA: Diagnosis not present

## 2022-01-21 DIAGNOSIS — D473 Essential (hemorrhagic) thrombocythemia: Secondary | ICD-10-CM | POA: Insufficient documentation

## 2022-01-21 DIAGNOSIS — E538 Deficiency of other specified B group vitamins: Secondary | ICD-10-CM | POA: Insufficient documentation

## 2022-01-21 DIAGNOSIS — Z803 Family history of malignant neoplasm of breast: Secondary | ICD-10-CM | POA: Insufficient documentation

## 2022-01-21 LAB — CMP (CANCER CENTER ONLY)
ALT: 17 U/L (ref 0–44)
AST: 22 U/L (ref 15–41)
Albumin: 4.7 g/dL (ref 3.5–5.0)
Alkaline Phosphatase: 93 U/L (ref 38–126)
Anion gap: 8 (ref 5–15)
BUN: 20 mg/dL (ref 8–23)
CO2: 29 mmol/L (ref 22–32)
Calcium: 10.2 mg/dL (ref 8.9–10.3)
Chloride: 103 mmol/L (ref 98–111)
Creatinine: 0.95 mg/dL (ref 0.44–1.00)
GFR, Estimated: 60 mL/min — ABNORMAL LOW (ref >60)
Glucose, Bld: 107 mg/dL — ABNORMAL HIGH (ref 70–99)
Potassium: 4.4 mmol/L (ref 3.5–5.1)
Sodium: 140 mmol/L (ref 135–145)
Total Bilirubin: 1.1 mg/dL (ref 0.3–1.2)
Total Protein: 6.9 g/dL (ref 6.5–8.1)

## 2022-01-21 LAB — CBC WITH DIFFERENTIAL (CANCER CENTER ONLY)
Abs Immature Granulocytes: 0.03 10*3/uL (ref 0.00–0.07)
Basophils Absolute: 0 10*3/uL (ref 0.0–0.1)
Basophils Relative: 0 %
Eosinophils Absolute: 0.2 10*3/uL (ref 0.0–0.5)
Eosinophils Relative: 2 %
HCT: 42.9 % (ref 36.0–46.0)
Hemoglobin: 14.3 g/dL (ref 12.0–15.0)
Immature Granulocytes: 0 %
Lymphocytes Relative: 15 %
Lymphs Abs: 1.5 10*3/uL (ref 0.7–4.0)
MCH: 32.8 pg (ref 26.0–34.0)
MCHC: 33.3 g/dL (ref 30.0–36.0)
MCV: 98.4 fL (ref 80.0–100.0)
Monocytes Absolute: 0.8 10*3/uL (ref 0.1–1.0)
Monocytes Relative: 8 %
Neutro Abs: 7.5 10*3/uL (ref 1.7–7.7)
Neutrophils Relative %: 75 %
Platelet Count: 399 10*3/uL (ref 150–400)
RBC: 4.36 MIL/uL (ref 3.87–5.11)
RDW: 15.9 % — ABNORMAL HIGH (ref 11.5–15.5)
WBC Count: 10.1 10*3/uL (ref 4.0–10.5)
nRBC: 0 % (ref 0.0–0.2)

## 2022-01-21 NOTE — Progress Notes (Signed)
  [facility] OFFICE PROGRESS NOTE   Diagnosis: Essential thrombocytosis  INTERVAL HISTORY:   Tiffany Vaughn returns as scheduled.  She continues hydroxyurea.  No mouth sores, bleeding, or symptom of thrombosis.  She just returned from a trip to [place].  She complains of chronic constipation.  She sometimes goes weeks without a bowel movement.  She takes stool softeners and MiraLAX periodically.  She feels bloated at times.  No bleeding.  Objective:  Vital signs in last 24 hours:  Blood pressure 116/71, pulse 68, temperature 98.2 F (36.8 C), temperature source Oral, resp. rate 18, height 5' 4.5" (1.638 m), weight 128 lb 3.2 oz (58.2 kg), SpO2 100 %.    HEENT: No thrush or ulcers Resp: Lungs clear bilaterally Cardio: Regular rate and rhythm GI: Nontender, no hepatosplenomegaly Vascular: No leg edema   Lab Results:  Lab Results  Component Value Date   WBC 10.1 01/21/2022   HGB 14.3 01/21/2022   HCT 42.9 01/21/2022   MCV 98.4 01/21/2022   PLT 399 01/21/2022   NEUTROABS 7.5 01/21/2022    CMP  Lab Results  Component Value Date   NA 140 01/21/2022   K 4.4 01/21/2022   CL 103 01/21/2022   CO2 29 01/21/2022   GLUCOSE 107 (H) 01/21/2022   BUN 20 01/21/2022   CREATININE 0.95 01/21/2022   CALCIUM 10.2 01/21/2022   PROT 6.9 01/21/2022   ALBUMIN 4.7 01/21/2022   AST 22 01/21/2022   ALT 17 01/21/2022   ALKPHOS 93 01/21/2022   BILITOT 1.1 01/21/2022   GFRNONAA 60 (L) 01/21/2022   GFRAA 60 10/02/2020     Medications: I have reviewed the patient's current medications.   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 Hydroxyurea started 09/04/2021 Hydroxyurea increased to 500 mg Monday through Friday, 10/02/2021 Hydroxyurea continued 500 mg Monday through Friday, 10/28/2021 Hydroxyurea increased to 500 mg daily 11/25/2021 Hemoglobin upper end of normal range Acute infarct left centrum semiovale 07/25/2021, currently on Plavix and  aspirin for 3 weeks then Plavix alone B12 deficiency, on oral replacement  Remote history of right breast cancer, status post radiation, completed 5 years of tamoxifen History of transient global amnesia Hypertension Hyperlipidemia Multiple family members with malignancy   Disposition: Ms. Donlan appears stable.  The platelet count is in goal range.  She will continue hydroxyurea at the current dose.  She will return for an office and lab visit in 2 months.  I recommended she follow-up with Dr. Melford Aase for evaluation and treatment of the chronic constipation.  Betsy Coder, MD  01/21/2022  12:00 PM

## 2022-01-22 ENCOUNTER — Other Ambulatory Visit: Payer: Self-pay | Admitting: Oncology

## 2022-01-22 DIAGNOSIS — D75839 Thrombocytosis, unspecified: Secondary | ICD-10-CM

## 2022-02-16 ENCOUNTER — Other Ambulatory Visit: Payer: Self-pay | Admitting: Oncology

## 2022-03-04 ENCOUNTER — Encounter: Payer: Self-pay | Admitting: Nurse Practitioner

## 2022-03-04 ENCOUNTER — Ambulatory Visit (INDEPENDENT_AMBULATORY_CARE_PROVIDER_SITE_OTHER): Payer: Medicare HMO | Admitting: Nurse Practitioner

## 2022-03-04 VITALS — BP 112/72 | HR 64 | Temp 97.9°F | Ht 64.5 in | Wt 128.0 lb

## 2022-03-04 DIAGNOSIS — Z0001 Encounter for general adult medical examination with abnormal findings: Secondary | ICD-10-CM

## 2022-03-04 DIAGNOSIS — I10 Essential (primary) hypertension: Secondary | ICD-10-CM

## 2022-03-04 DIAGNOSIS — R69 Illness, unspecified: Secondary | ICD-10-CM | POA: Diagnosis not present

## 2022-03-04 DIAGNOSIS — R6889 Other general symptoms and signs: Secondary | ICD-10-CM

## 2022-03-04 DIAGNOSIS — J3089 Other allergic rhinitis: Secondary | ICD-10-CM | POA: Diagnosis not present

## 2022-03-04 DIAGNOSIS — M8580 Other specified disorders of bone density and structure, unspecified site: Secondary | ICD-10-CM | POA: Diagnosis not present

## 2022-03-04 DIAGNOSIS — F325 Major depressive disorder, single episode, in full remission: Secondary | ICD-10-CM

## 2022-03-04 DIAGNOSIS — R7309 Other abnormal glucose: Secondary | ICD-10-CM | POA: Diagnosis not present

## 2022-03-04 DIAGNOSIS — K5901 Slow transit constipation: Secondary | ICD-10-CM

## 2022-03-04 DIAGNOSIS — E782 Mixed hyperlipidemia: Secondary | ICD-10-CM

## 2022-03-04 DIAGNOSIS — Z853 Personal history of malignant neoplasm of breast: Secondary | ICD-10-CM

## 2022-03-04 DIAGNOSIS — Z0000 Encounter for general adult medical examination without abnormal findings: Secondary | ICD-10-CM

## 2022-03-04 DIAGNOSIS — Z6822 Body mass index (BMI) 22.0-22.9, adult: Secondary | ICD-10-CM

## 2022-03-04 DIAGNOSIS — R194 Change in bowel habit: Secondary | ICD-10-CM | POA: Diagnosis not present

## 2022-03-04 DIAGNOSIS — J302 Other seasonal allergic rhinitis: Secondary | ICD-10-CM | POA: Diagnosis not present

## 2022-03-04 DIAGNOSIS — E559 Vitamin D deficiency, unspecified: Secondary | ICD-10-CM | POA: Diagnosis not present

## 2022-03-04 DIAGNOSIS — N1831 Chronic kidney disease, stage 3a: Secondary | ICD-10-CM

## 2022-03-04 NOTE — Progress Notes (Signed)
MEDICARE ANNUAL WELLNESS VISIT AND 3 MONTH FOLLOW UP  Assessment:   Vitamin D deficiency At goal at recent check;  Defer vitamin D level   Depression, remission ([place]) In remission off of medications   History of cancer of right breast Continue close follow ups, mammogram annually  Osteopenia Pursue a combination of weight-bearing exercises and strength training. Advised on fall prevention measures including proper lighting in all rooms, removal of area rugs and floor clutter, use of walking devices as deemed appropriate, avoidance of uneven walking surfaces. Smoking cessation and moderate alcohol consumption if applicable Consume 992 to 1000 IU of vitamin D daily with a goal vitamin D serum value of 30 ng/mL or higher. Aim for 1000 to 1200 mg of elemental calcium daily through supplements and/or dietary sources.   Seasonal allergies Continue OTC allergy pills  Other abnormal glucose Education: Reviewed 'ABCs' of diabetes management  Discussed goals to be met and/or maintained include A1C (<7) Blood pressure (<130/80) Cholesterol (LDL <70) Continue Eye Exam yearly  Continue Dental Exam Q6 mo Discussed dietary recommendations Discussed Physical Activity recommendations  BMI 22 Continue to recommend diet heavy in fruits and veggies and low in animal meats, cheeses, and dairy products, appropriate calorie intake Discuss exercise recommendations routinely Continue to monitor weight at each visit  CKD 3a ([place]) Continue ACE, bASA, statin Discussed how what you eat and drink can aide in kidney protection. Stay well hydrated. Avoid high salt foods. Avoid NSAIDS. Keep BP and BG well controlled.   Take medications as prescribed. Remain active and exercise as tolerated daily. Maintain weight.  Continue to monitor.  Bowel habit changes/slow transit constipation Stay well hydrated. Suggest daily stool softener. Remain active.   Orders Placed This Encounter  Procedures    DG Abd 2 Views    Standing Status:   Future    Number of Occurrences:   1    Standing Expiration Date:   03/05/2023    Order Specific Question:   Reason for Exam (SYMPTOM  OR DIAGNOSIS REQUIRED)    Answer:   constipation, bowel change    Order Specific Question:   Preferred imaging location?    Answer:   [id number]     Future Appointments  Date Time Provider [place]  03/24/2022 10:15 AM [facility] PHLEBOTOMIST [facility] None  03/24/2022 10:45 AM Owens Shark, NP [facility] None  08/18/2022 10:15 AM Alric Ran, MD [facility] None  11/18/2022 10:00 AM Alycia Rossetti, NP [facility] None  02/18/2023 11:00 AM Darrol Jump, NP [facility] None    Plan:   During the course of the visit the patient was educated and counseled about appropriate screening and preventive services including:   Pneumococcal vaccine  Influenza vaccine Td vaccine Screening electrocardiogram Screening mammography Bone densitometry screening Colorectal cancer screening Diabetes screening Glaucoma screening Nutrition counseling  Advanced directives: given information/requested  Subjective:   Tiffany Vaughn is a 81 y.o. female who presents for Medicare Annual Wellness Visit and follow up for HTN, chol, weight, vitamin D def. In May 2020,  patient was hospitalized to r/o CVA and was diagnosed with TGA and Brain scans were negative.  Was on Evista for history of previous right breast cancer 30 years ago but was recently discontinued. Continues with annual mammograms.   Has increase in constipation.  Has noticed decrease in BM and increase in bloating, abd pain.  She takes Metamucil with some improvement.  Tries to stay hydrated.    She has hx of depression in remission  off of medication for may years.    BMI is Body mass index is 21.63 kg/m., she has been working on diet, is active, walking 1 mile daily. Wt Readings from Last 3 Encounters:  03/04/22 128 lb (58.1 kg)  01/21/22 128 lb  3.2 oz (58.2 kg)  11/25/21 126 lb 3.2 oz (57.2 kg)   Her blood pressure has been controlled at home, today their BP is BP: 112/72 She does workout, she is on toprol for HA. She denies chest pain, shortness of breath, dizziness (was having positional, improved with increased fluid).   She is on cholesterol medication, lipitor 20 mg daily Her cholesterol is at goal. The cholesterol last visit was:   Lab Results  Component Value Date   CHOL 184 11/17/2021   HDL 47 (L) 11/17/2021   LDLCALC 116 (H) 11/17/2021   TRIG 104 11/17/2021   CHOLHDL 3.9 11/17/2021    She has been working on diet and exercise for prediabetes, and denies foot ulcerations, increased appetite, nausea, paresthesia of the feet, polydipsia, polyuria, visual disturbances, vomiting and weight loss. Last A1C in the office was:  Lab Results  Component Value Date   HGBA1C 6.0 (H) 11/17/2021   She has CKD 3a monitored at this office. Last GFR:  Lab Results  Component Value Date   GFRNONAA 60 (L) 01/21/2022   Patient is on Vitamin D supplement.   Lab Results  Component Value Date   VD25OH 72 11/17/2021        Medication Review Current Outpatient Medications on File Prior to Visit  Medication Sig Dispense Refill   Ascorbic Acid (VITAMIN C PO) Take 500 mg by mouth daily.     atorvastatin (LIPITOR) 40 MG tablet TAKE 1 TAB DAILY FOR CHOLESTEROL. 90 tablet 1   Cholecalciferol (VITAMIN D PO) Take 5,000 Units by mouth daily.      clopidogrel (PLAVIX) 75 MG tablet Take 1 tablet (75 mg total) by mouth daily. ALL FUTURE REFILLS PER NEUROLOGY 30 tablet 0   cyanocobalamin 1000 MCG tablet Take 1 tablet (1,000 mcg total) by mouth daily. 30 tablet 5   desonide (DESOWEN) 0.05 % cream Apply topically 2 (two) times daily. (Patient taking differently: Apply 1 application  topically 2 (two) times daily as needed (dry skin).) 30 g 0   hydroxyurea (HYDREA) 500 MG capsule TAKE 1 CAPSULE (500 MG TOTAL) BY MOUTH DAILY 30 capsule 1    metoprolol succinate (TOPROL-XL) 25 MG 24 hr tablet Take 1 tablet  Daily for BP                                                      /                            TAKE                                 BY                     MOUTH 90 tablet 3   Omega-3 Fatty Acids (FISH OIL PO) Take 1,000 mg by mouth daily.     Probiotic Product (PROBIOTIC PO) Take 250 mg by mouth daily.  No current facility-administered medications on file prior to visit.    Current Problems (verified) Patient Active Problem List   Diagnosis Date Noted   Genetic testing 08/31/2021   Family history of breast cancer 08/12/2021   Family history of leukemia 08/12/2021   Global amnesia 07/24/2021   Aortic atherosclerosis ([place]) 08/28/2020   BMI 22.0-22.9, adult 06/13/2020   CKD (chronic kidney disease) stage 3, GFR 30-59 ml/min ([facility]) 11/02/2019   Seasonal and perennial allergic rhinitis 08/31/2019   Ptosis of eyelid 09/12/2017   Osteopenia 12/23/2014   Medication management 10/24/2013   Hyperlipidemia    Hypertension    Vitamin D deficiency    Depression, major, in remission ([place])    History of right breast cancer     Screening Tests Immunization History  Administered Date(s) Administered   DT (Pediatric) 12/23/2014   Influenza, High Dose Seasonal PF 03/31/2019   Influenza-Unspecified 04/02/2016, 03/16/2018, 04/27/2020, 05/27/2021   PFIZER(Purple Top)SARS-COV-2 Vaccination 07/13/2019, 08/08/2019, 04/17/2020   Pneumococcal Conjugate-13 12/17/2013   Pneumococcal Polysaccharide-23 10/23/2009   Td 04/15/2005   Zoster, Live 11/19/2010   Preventative care: Last colonoscopy: 05/2014  Dr. Mary Sella, normal, 5 year follow up, hx of polyps but not precancerous, prefers to do cologuard Cologuard: 10/2019 neg DONE Last mammogram: 06/2021 Last pap smear/pelvic exam: 2009 remote DEXA: 06/2018, t -1.8, osteopenia - ORDER PLACED Cardiolite 2011 negative EF 65%   Prior vaccinations: TD: 2016  Influenza: Due  03/2022 Pneumococcal: 2011 Prevnar 13: 2015 Shingles/Zostavax: 2012  Covid: 2/2, 2021, pfizer + booster  Names of Other Physician/Practitioners you currently use: 1. [facility]- here for primary care 2. Dr. Everlene Farrier, eye doctor, last visit seeing 2020, looking for new provider  3. Dr. Deanna Artis, dentist, last visit q 6 months 2022  Patient Care Team: Unk Pinto, MD as PCP - General (Internal Medicine) Irene Shipper, MD as Consulting Physician (Gastroenterology)   Allergies Allergies  Allergen Reactions   Codeine Nausea And Vomiting    "Feels like I have the flu"   SURGICAL HISTORY She  has a past surgical history that includes Tonsillectomy and adenoidectomy; Abdominal hysterectomy; Breast lumpectomy (Right); and Cataract extraction, bilateral (Bilateral, 2016). FAMILY HISTORY Her family history includes ALS in her brother; Breast cancer in her paternal grandfather; Breast cancer (age of onset: 20) in her niece; Cancer in her maternal aunt; Heart attack in her brother and maternal grandfather; Heart attack (age of onset: 64) in her mother; Heart attack (age of onset: 8) in her father; Leukemia in her brother and paternal grandmother; Leukemia (age of onset: 11) in some other family members; Stomach cancer in her maternal grandmother. SOCIAL HISTORY She  reports that she has never smoked. She has never used smokeless tobacco. She reports that she does not drink alcohol and does not use drugs.  MEDICARE WELLNESS OBJECTIVES: Physical activity:  no regular exercise at this time Depression/mood screen:      01/29/2021   10:53 AM  Depression screen PHQ 2/9  Decreased Interest 0  Down, Depressed, Hopeless 0  PHQ - 2 Score 0   Hearing: normal Visual acuity: normal,  does perform annual eye exam  ADLs:     03/07/2022   10:54 PM 03/04/2022   11:40 AM  In your present state of health, do you have any difficulty performing the following  activities:  Hearing? 0 0  Vision? 0 0  Difficulty concentrating or making decisions?  0  Walking or climbing stairs?  0  Dressing or bathing?  0  Doing errands, shopping?  0  Preparing Food and eating ?  N  Using the Toilet?  N  In the past six months, have you accidently leaked urine?  N  Do you have problems with loss of bowel control?  N  Managing your Medications?  N  Managing your Finances?  N  Housekeeping or managing your Housekeeping?  N    Fall risk: Low Risk Cognitive Testing  Alert? Yes  Normal Appearance?Yes  Oriented to person? Yes  Place? Yes   Time? Yes  Recall of three objects?  Yes  Can perform simple calculations? Yes  Displays appropriate judgment?Yes  Can read the correct time from a watch face?Yes  EOL planning:      Objective:     Today's Vitals   03/04/22 1113  BP: 112/72  Pulse: 64  Temp: 97.9 F (36.6 C)  SpO2: 95%  Weight: 128 lb (58.1 kg)  Height: 5' 4.5" (1.638 m)   Body mass index is 21.63 kg/m.  General Appearance: Well nourished, in no apparent distress. Eyes: PERRLA, EOMs, conjunctiva no swelling or erythema Sinuses: No Frontal/maxillary tenderness ENT/Mouth: Ext aud canals with dry, erythematous flaky skin, TMs without erythema, bulging. No erythema, swelling, or exudate on post pharynx.  Tonsils not swollen or erythematous. Hearing normal.  Neck: Supple, thyroid normal.  Respiratory: Respiratory effort normal, BS equal bilaterally without rales, rhonchi, wheezing or stridor.  Cardio: RRR with no MRGs. Brisk peripheral pulses without edema.  Abdomen: Soft, + BS.  Non tender, no guarding, rebound, hernias, masses. Lymphatics: Non tender without lymphadenopathy.  Musculoskeletal: Full ROM, 5/5 strength, Normal gait Skin: Warm, dry without rashes, lesions, ecchymosis.  Neuro: Cranial nerves intact. No cerebellar symptoms.  Psych: Awake and oriented X 3, normal affect, Insight and Judgment appropriate.    Medicare Attestation I  have personally reviewed: The patient's medical and social history Their use of alcohol, tobacco or illicit drugs Their current medications and supplements The patient's functional ability including ADLs,fall risks, home safety risks, cognitive, and hearing and visual impairment Diet and physical activities Evidence for depression or mood disorders  The patient's weight, height, BMI, and visual acuity have been recorded in the chart.  I have made referrals, counseling, and provided education to the patient based on review of the above and I have provided the patient with a written personalized care plan for preventive services.     Darrol Jump, NP   03/07/2022

## 2022-03-04 NOTE — Patient Instructions (Signed)

## 2022-03-05 ENCOUNTER — Ambulatory Visit
Admission: RE | Admit: 2022-03-05 | Discharge: 2022-03-05 | Disposition: A | Discharge status: Home / Self Care | Payer: Medicare HMO | Source: Ambulatory Visit | Attending: Nurse Practitioner | Admitting: Nurse Practitioner

## 2022-03-05 DIAGNOSIS — K5900 Constipation, unspecified: Secondary | ICD-10-CM | POA: Diagnosis not present

## 2022-03-05 DIAGNOSIS — K5901 Slow transit constipation: Secondary | ICD-10-CM

## 2022-03-05 DIAGNOSIS — R194 Change in bowel habit: Secondary | ICD-10-CM

## 2022-03-06 DIAGNOSIS — E785 Hyperlipidemia, unspecified: Secondary | ICD-10-CM | POA: Diagnosis not present

## 2022-03-06 DIAGNOSIS — I129 Hypertensive chronic kidney disease with stage 1 through stage 4 chronic kidney disease, or unspecified chronic kidney disease: Secondary | ICD-10-CM | POA: Diagnosis not present

## 2022-03-06 DIAGNOSIS — Z85828 Personal history of other malignant neoplasm of skin: Secondary | ICD-10-CM | POA: Diagnosis not present

## 2022-03-06 DIAGNOSIS — Z7964 Long term (current) use of myelosuppressive agent: Secondary | ICD-10-CM | POA: Diagnosis not present

## 2022-03-06 DIAGNOSIS — N182 Chronic kidney disease, stage 2 (mild): Secondary | ICD-10-CM | POA: Diagnosis not present

## 2022-03-06 DIAGNOSIS — R32 Unspecified urinary incontinence: Secondary | ICD-10-CM | POA: Diagnosis not present

## 2022-03-06 DIAGNOSIS — Z7902 Long term (current) use of antithrombotics/antiplatelets: Secondary | ICD-10-CM | POA: Diagnosis not present

## 2022-03-06 DIAGNOSIS — Z8673 Personal history of transient ischemic attack (TIA), and cerebral infarction without residual deficits: Secondary | ICD-10-CM | POA: Diagnosis not present

## 2022-03-06 DIAGNOSIS — D473 Essential (hemorrhagic) thrombocythemia: Secondary | ICD-10-CM | POA: Diagnosis not present

## 2022-03-06 DIAGNOSIS — Z853 Personal history of malignant neoplasm of breast: Secondary | ICD-10-CM | POA: Diagnosis not present

## 2022-03-10 ENCOUNTER — Telehealth: Payer: Self-pay | Admitting: Nurse Practitioner

## 2022-03-10 NOTE — Telephone Encounter (Signed)
Patient was in on 03/04/22 and states that you were going to get back in touch with her about what you recommended to do for constipation but she hasn't heard back yet. Please advise.

## 2022-03-10 NOTE — Telephone Encounter (Signed)
See Imaging encounter

## 2022-03-24 ENCOUNTER — Inpatient Hospital Stay: Payer: Medicare HMO | Attending: Nurse Practitioner

## 2022-03-24 ENCOUNTER — Inpatient Hospital Stay: Payer: Medicare HMO | Admitting: Nurse Practitioner

## 2022-03-24 VITALS — BP 109/60 | HR 66 | Temp 98.1°F | Resp 18 | Ht 64.5 in | Wt 127.6 lb

## 2022-03-24 DIAGNOSIS — E538 Deficiency of other specified B group vitamins: Secondary | ICD-10-CM | POA: Insufficient documentation

## 2022-03-24 DIAGNOSIS — E785 Hyperlipidemia, unspecified: Secondary | ICD-10-CM | POA: Insufficient documentation

## 2022-03-24 DIAGNOSIS — I10 Essential (primary) hypertension: Secondary | ICD-10-CM | POA: Insufficient documentation

## 2022-03-24 DIAGNOSIS — Z853 Personal history of malignant neoplasm of breast: Secondary | ICD-10-CM | POA: Insufficient documentation

## 2022-03-24 DIAGNOSIS — D75839 Thrombocytosis, unspecified: Secondary | ICD-10-CM

## 2022-03-24 DIAGNOSIS — D473 Essential (hemorrhagic) thrombocythemia: Secondary | ICD-10-CM | POA: Diagnosis not present

## 2022-03-24 LAB — CBC WITH DIFFERENTIAL (CANCER CENTER ONLY)
Abs Immature Granulocytes: 0.02 10*3/uL (ref 0.00–0.07)
Basophils Absolute: 0 10*3/uL (ref 0.0–0.1)
Basophils Relative: 0 %
Eosinophils Absolute: 0.2 10*3/uL (ref 0.0–0.5)
Eosinophils Relative: 2 %
HCT: 40.3 % (ref 36.0–46.0)
Hemoglobin: 13.3 g/dL (ref 12.0–15.0)
Immature Granulocytes: 0 %
Lymphocytes Relative: 23 %
Lymphs Abs: 1.7 10*3/uL (ref 0.7–4.0)
MCH: 33.8 pg (ref 26.0–34.0)
MCHC: 33 g/dL (ref 30.0–36.0)
MCV: 102.3 fL — ABNORMAL HIGH (ref 80.0–100.0)
Monocytes Absolute: 0.7 10*3/uL (ref 0.1–1.0)
Monocytes Relative: 9 %
Neutro Abs: 4.8 10*3/uL (ref 1.7–7.7)
Neutrophils Relative %: 66 %
Platelet Count: 351 10*3/uL (ref 150–400)
RBC: 3.94 MIL/uL (ref 3.87–5.11)
RDW: 14.9 % (ref 11.5–15.5)
WBC Count: 7.4 10*3/uL (ref 4.0–10.5)
nRBC: 0 % (ref 0.0–0.2)

## 2022-03-24 MED ORDER — HYDROXYUREA 500 MG PO CAPS: 500.0000 mg | ORAL_CAPSULE | Freq: Every day | ORAL | 0 refills | Status: DC

## 2022-03-24 NOTE — Progress Notes (Signed)
  [facility] OFFICE PROGRESS NOTE   Diagnosis: Essential thrombocytosis  INTERVAL HISTORY:   Tiffany Vaughn returns as scheduled.  She continues hydroxyurea 500 mg daily.  She denies nausea/vomiting.  No mouth sores.  No diarrhea.  No skin rash.  No bleeding.  No symptom of thrombosis.  Objective:  Vital signs in last 24 hours:  Blood pressure 109/60, pulse 66, temperature 98.1 F (36.7 C), temperature source Oral, resp. rate 18, height 5' 4.5" (1.638 m), weight 127 lb 9.6 oz (57.9 kg), SpO2 100 %.    HEENT: No thrush or ulcers. Resp: Lungs clear bilaterally. Cardio: Regular rate and rhythm. GI: No hepatosplenomegaly. Vascular: No leg edema. Skin: No rash.   Lab Results:  Lab Results  Component Value Date   WBC 7.4 03/24/2022   HGB 13.3 03/24/2022   HCT 40.3 03/24/2022   MCV 102.3 (H) 03/24/2022   PLT 351 03/24/2022   NEUTROABS 4.8 03/24/2022    Imaging:  No results found.  Medications: I have reviewed the patient's current medications.  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 Hydroxyurea started 09/04/2021 Hydroxyurea increased to 500 mg Monday through Friday, 10/02/2021 Hydroxyurea continued 500 mg Monday through Friday, 10/28/2021 Hydroxyurea increased to 500 mg daily 11/25/2021 Hemoglobin upper end of normal range Acute infarct left centrum semiovale 07/25/2021, currently on Plavix and aspirin for 3 weeks then Plavix alone B12 deficiency, on oral replacement  Remote history of right breast cancer, status post radiation, completed 5 years of tamoxifen History of transient global amnesia Hypertension Hyperlipidemia Multiple family members with malignancy  Disposition: Tiffany Vaughn appears stable.  We reviewed the CBC from today.  Platelet count remains in goal range.  She will continue hydroxyurea at the current dose.  She will return for lab and follow-up in 2 to 3 months.    Ned Card ANP/GNP-BC   03/24/2022   10:56 AM

## 2022-03-26 ENCOUNTER — Encounter: Payer: Self-pay | Admitting: Nurse Practitioner

## 2022-03-26 ENCOUNTER — Ambulatory Visit (INDEPENDENT_AMBULATORY_CARE_PROVIDER_SITE_OTHER): Payer: Medicare HMO | Admitting: Nurse Practitioner

## 2022-03-26 VITALS — BP 120/80 | HR 61 | Temp 97.2°F | Ht 64.5 in | Wt 129.8 lb

## 2022-03-26 DIAGNOSIS — L237 Allergic contact dermatitis due to plants, except food: Secondary | ICD-10-CM

## 2022-03-26 DIAGNOSIS — N1831 Chronic kidney disease, stage 3a: Secondary | ICD-10-CM

## 2022-03-26 MED ORDER — DEXAMETHASONE SODIUM PHOSPHATE 10 MG/ML IJ SOLN
20.0000 mg | Freq: Once | INTRAMUSCULAR | Status: AC
  Administered 2022-03-26: 20 mg via INTRAMUSCULAR

## 2022-03-26 MED ORDER — DEXAMETHASONE 4 MG PO TABS: ORAL_TABLET | ORAL | 0 refills | Status: DC

## 2022-03-26 NOTE — Patient Instructions (Signed)
Poison Ivy Dermatitis Poison ivy dermatitis is inflammation of the skin that is caused by chemicals in the leaves of the poison ivy plant. The skin reaction often involves redness, swelling, blisters, and extreme itching. What are the causes? This condition is caused by a chemical (urushiol) found in the sap of the poison ivy plant. This chemical is sticky and can be easily spread to people, animals, and objects. You can get poison ivy dermatitis by: Having direct contact with a poison ivy plant. Touching animals, other people, or objects that have come in contact with poison ivy and have the chemical on them. What increases the risk? This condition is more likely to develop in people who: Are outdoors often in wooded or [place] areas. Go outdoors without wearing protective clothing, such as closed shoes, long pants, and a long-sleeved shirt. What are the signs or symptoms? Symptoms of this condition include: Redness of the skin. Extreme itching. A rash that often includes bumps and blisters. The rash usually appears 48 hours after exposure, if you have been exposed before. If this is the first time you have been exposed, the rash may not appear until a week after exposure. Swelling. This may occur if the reaction is more severe. Symptoms usually last for 1-2 weeks. However, the first time you develop this condition, symptoms may last 3-4 weeks. How is this diagnosed? This condition may be diagnosed based on your symptoms and a physical exam. Your health care provider may also ask you about any recent outdoor activity. How is this treated? Treatment for this condition will vary depending on how severe it is. Treatment may include: Hydrocortisone cream or calamine lotion to relieve itching. Oatmeal baths to soothe the skin. Medicines, such as over-the-counter antihistamine tablets. Oral steroid medicine, for more severe reactions. Follow these instructions at home: Medicines Take or apply  over-the-counter and prescription medicines only as told by your health care provider. Use hydrocortisone cream or calamine lotion as needed to soothe the skin and relieve itching. General instructions Do not scratch or rub your skin. Apply a cold, wet cloth (cold compress) to the affected areas or take baths in cool water. This will help with itching. Avoid hot baths and showers. Take oatmeal baths as needed. Use colloidal oatmeal. You can get this at your local pharmacy or grocery store. Follow the instructions on the packaging. While you have the rash, wash clothes right after you wear them. Keep all follow-up visits as told by your health care provider. This is important. How is this prevented?  Learn to identify the poison ivy plant and avoid contact with the plant. This plant can be recognized by the number of leaves. Generally, poison ivy has three leaves with flowering branches on a single stem. The leaves are typically glossy, and they have jagged edges that come to a point at the front. If you have been exposed to poison ivy, thoroughly wash with soap and water right away. You have about 30 minutes to remove the plant resin before it will cause the rash. Be sure to wash under your fingernails, because any plant resin there will continue to spread the rash. When hiking or camping, wear clothes that will help you to avoid exposure on the skin. This includes long pants, a long-sleeved shirt, tall socks, and hiking boots. You can also apply preventive lotion to your skin to help limit exposure. If you suspect that your clothes or outdoor gear came in contact with poison ivy, rinse them off outside  with a garden hose before you bring them inside your house. When doing yard work or gardening, wear gloves, long sleeves, long pants, and boots. Wash your garden tools and gloves if they come in contact with poison ivy. If you suspect that your pet has come into contact with poison ivy, wash him or her  with pet shampoo and water. Make sure to wear gloves while washing your pet. Contact a health care provider if you have: Open sores in the rash area. More redness, swelling, or pain in the affected area. Redness that spreads beyond the rash area. Fluid, blood, or pus coming from the affected area. A fever. A rash over a large area of your body. A rash on your eyes, mouth, or genitals. A rash that does not improve after a few weeks. Get help right away if: Your face swells or your eyes swell shut. You have trouble breathing. You have trouble swallowing. These symptoms may represent a serious problem that is an emergency. Do not wait to see if the symptoms will go away. Get medical help right away. Call your local emergency services (911 in the [place] Do not drive yourself to the hospital. Summary Poison ivy dermatitis is inflammation of the skin that is caused by chemicals in the leaves of the poison ivy plant. Symptoms of this condition include redness, itching, a rash, and swelling. Do not scratch or rub your skin. Take or apply over-the-counter and prescription medicines only as told by your health care provider. This information is not intended to replace advice given to you by your health care provider. Make sure you discuss any questions you have with your health care provider. Document Revised: 03/30/2021 Document Reviewed: 03/30/2021 Elsevier Patient Education  [place].

## 2022-03-26 NOTE — Progress Notes (Signed)
Assessment and Plan: Clotiel was seen today for poison ivy.  Diagnoses and all orders for this visit:  Poison ivy Avoid hot baths, use Benadryl and oatmeal baths in cool water.  -     dexamethasone (DECADRON) injection 20 mg -     dexamethasone (DECADRON) 4 MG tablet; Take 3 tabs for 3 days, 2 tabs for 3 days 1 tab for 5 days. Take with food.  CKD stage 2 Increase fluids, avoid NSAIDS, monitor sugars, will monitor     Further disposition pending results of labs. Discussed med's effects and SE's.   Over 30 minutes of exam, counseling, chart review, and critical decision making was performed.   Future Appointments  Date Time Provider [place]  06/16/2022 11:15 AM [facility] PHLEBOTOMIST [facility] None  06/16/2022 11:40 AM Ladell Pier, MD [facility] None  08/18/2022 10:15 AM Alric Ran, MD [facility] None  11/18/2022 10:00 AM Alycia Rossetti, NP [facility] None  02/18/2023 11:00 AM Darrol Jump, NP [facility] None    ------------------------------------------------------------------------------------------------------------------   HPI BP 120/80   Pulse 61   Temp (!) 97.2 F (36.2 C)   Ht 5' 4.5" (1.638 m)   Wt 129 lb 12.8 oz (58.9 kg)   SpO2 97%   BMI 21.94 kg/m   81 y.o.female presents for Horrible itching patches on face, legs, belly, buttocks and arms. She has a history of poison ivy outbreaks. Symptoms started 3 days ago after working in her yard.    She has been drinking a lot more water and kidney functions have improved Lab Results  Component Value Date   EGFR 64 11/17/2021   BMI is Body mass index is 21.94 kg/m., she has been working on diet and exercise. Wt Readings from Last 3 Encounters:  03/26/22 129 lb 12.8 oz (58.9 kg)  03/24/22 127 lb 9.6 oz (57.9 kg)  03/04/22 128 lb (58.1 kg)     Past Medical History:  Diagnosis Date   Cancer of right breast ([place]) 1992   Hyperlipidemia    Hypertension    Postural hypotension 11/14/2018    Transient global amnesia 11/14/2018   Vitamin D deficiency      Allergies  Allergen Reactions   Codeine Nausea And Vomiting    "Feels like I have the flu"    Current Outpatient Medications on File Prior to Visit  Medication Sig   Ascorbic Acid (VITAMIN C PO) Take 500 mg by mouth daily.   atorvastatin (LIPITOR) 40 MG tablet TAKE 1 TAB DAILY FOR CHOLESTEROL.   Cholecalciferol (VITAMIN D PO) Take 5,000 Units by mouth daily.    clopidogrel (PLAVIX) 75 MG tablet Take 1 tablet (75 mg total) by mouth daily. ALL FUTURE REFILLS PER NEUROLOGY   cyanocobalamin 1000 MCG tablet Take 1 tablet (1,000 mcg total) by mouth daily.   desonide (DESOWEN) 0.05 % cream Apply topically 2 (two) times daily. (Patient taking differently: Apply 1 application  topically 2 (two) times daily as needed (dry skin).)   hydroxyurea (HYDREA) 500 MG capsule Take 1 capsule (500 mg total) by mouth daily.   metoprolol succinate (TOPROL-XL) 25 MG 24 hr tablet Take 1 tablet  Daily for BP                                                      /  TAKE                                 BY                     MOUTH   Omega-3 Fatty Acids (FISH OIL PO) Take 1,000 mg by mouth daily.   Probiotic Product (PROBIOTIC PO) Take 250 mg by mouth daily.   No current facility-administered medications on file prior to visit.    ROS: all negative except above.   Physical Exam:  BP 120/80   Pulse 61   Temp (!) 97.2 F (36.2 C)   Ht 5' 4.5" (1.638 m)   Wt 129 lb 12.8 oz (58.9 kg)   SpO2 97%   BMI 21.94 kg/m   General Appearance: Well nourished, in no apparent distress. Eyes: PERRLA, EOMs, conjunctiva no swelling or erythema Sinuses: No Frontal/maxillary tenderness ENT/Mouth: Ext aud canals clear, TMs without erythema, bulging. No erythema, swelling, or exudate on post pharynx.  Tonsils not swollen or erythematous. Hearing normal.  Neck: Supple, thyroid normal.  Respiratory: Respiratory effort normal, BS equal  bilaterally without rales, rhonchi, wheezing or stridor.  Cardio: RRR with no MRGs. Brisk peripheral pulses without edema.  Abdomen: Soft, + BS.  Non tender, no guarding, rebound, hernias, masses. Lymphatics: Non tender without lymphadenopathy.  Musculoskeletal: Full ROM, 5/5 strength, normal gait.  Skin: Warm, dry . Poison ivy patches on face, arms/legs, belly and buttocks Neuro: Cranial nerves intact. Normal muscle tone, no cerebellar symptoms. Sensation intact.  Psych: Awake and oriented X 3, normal affect, Insight and Judgment appropriate.     Alycia Rossetti, NP 11:36 AM [name]

## 2022-03-29 ENCOUNTER — Other Ambulatory Visit: Payer: Self-pay | Admitting: Nurse Practitioner

## 2022-03-29 ENCOUNTER — Telehealth: Payer: Self-pay | Admitting: Nurse Practitioner

## 2022-03-29 DIAGNOSIS — E782 Mixed hyperlipidemia: Secondary | ICD-10-CM

## 2022-03-29 MED ORDER — ATORVASTATIN CALCIUM 40 MG PO TABS: ORAL_TABLET | ORAL | 1 refills | Status: DC

## 2022-03-29 NOTE — Telephone Encounter (Signed)
Done

## 2022-03-29 NOTE — Telephone Encounter (Signed)
When atrovastatin is due for refill please send to [facility] on [address]

## 2022-03-31 ENCOUNTER — Telehealth: Payer: Self-pay | Admitting: Internal Medicine

## 2022-03-31 NOTE — Chronic Care Management (AMB) (Signed)
  Chronic Care Management   Outreach Note  03/31/2022 Name: Tiffany Vaughn MRN: [id number] DOB: July 01, 1940  Referred by: Unk Pinto, MD Reason for referral : No chief complaint on file.   An unsuccessful telephone outreach was attempted today. The patient was referred to the pharmacist for assistance with care management and care coordination.   Follow Up Plan:   [name] [facility] Scheduler

## 2022-04-06 ENCOUNTER — Other Ambulatory Visit: Payer: Self-pay | Admitting: Nurse Practitioner

## 2022-04-06 ENCOUNTER — Ambulatory Visit: Payer: Medicare HMO | Admitting: Nurse Practitioner

## 2022-04-06 ENCOUNTER — Telehealth: Payer: Self-pay | Admitting: Internal Medicine

## 2022-04-06 DIAGNOSIS — L239 Allergic contact dermatitis, unspecified cause: Secondary | ICD-10-CM

## 2022-04-06 NOTE — Progress Notes (Signed)
  Chronic Care Management   Note  04/06/2022 Name: Tiffany Vaughn MRN: [id number] DOB: [birth year]  Tiffany Vaughn is a 81 y.o. year old female who is a primary care patient of Unk Pinto, MD. I reached out to Gwenith Spitz by phone today in response to a referral sent by [name]'s PCP, Unk Pinto, MD.   [name] was given information about Chronic Care Management services today including:  CCM service includes personalized support from designated clinical staff supervised by her physician, including individualized plan of care and coordination with other care providers 24/7 contact phone numbers for assistance for urgent and routine care needs. Service will only be billed when office clinical staff spend 20 minutes or more in a month to coordinate care. Only one practitioner may furnish and bill the service in a calendar month. The patient may stop CCM services at any time (effective at the end of the month) by phone call to the office staff.   Patient agreed to services and verbal consent obtained.   Follow up plan: NO COPAY   [place]

## 2022-04-19 ENCOUNTER — Other Ambulatory Visit: Payer: Self-pay

## 2022-04-19 DIAGNOSIS — E782 Mixed hyperlipidemia: Secondary | ICD-10-CM

## 2022-04-19 MED ORDER — ATORVASTATIN CALCIUM 40 MG PO TABS: ORAL_TABLET | ORAL | 1 refills | Status: DC

## 2022-05-03 ENCOUNTER — Other Ambulatory Visit: Payer: Self-pay | Admitting: Nurse Practitioner

## 2022-05-03 DIAGNOSIS — L239 Allergic contact dermatitis, unspecified cause: Secondary | ICD-10-CM

## 2022-05-17 ENCOUNTER — Telehealth: Payer: Self-pay

## 2022-05-17 NOTE — Telephone Encounter (Signed)
LM-05/19/22-Care plan created for upcoming initial CP visit on 11/28.  Total time spent: 5 min.       LM-05/17/22-Chart Prep started, Reviewing OV, Consults, Hospital visits, Labs and medication changes. Chart prep complete. Chart prep complete.  Total time spent: 47 min.

## 2022-05-19 NOTE — Telephone Encounter (Signed)
LM-05/19/22-Called pt. And confirmed initial CP phone visit for 11/28 at 10:30AM. Pt. Has no co-pay barriers and no health concerns she'd like to discuss at visit.  Total time spent: 5 min.

## 2022-05-25 ENCOUNTER — Ambulatory Visit: Payer: Medicare HMO | Admitting: Pharmacy Technician

## 2022-05-25 DIAGNOSIS — Z79899 Other long term (current) drug therapy: Secondary | ICD-10-CM

## 2022-05-25 DIAGNOSIS — M8589 Other specified disorders of bone density and structure, multiple sites: Secondary | ICD-10-CM

## 2022-05-25 DIAGNOSIS — E7800 Pure hypercholesterolemia, unspecified: Secondary | ICD-10-CM

## 2022-05-26 NOTE — Telephone Encounter (Signed)
LM-05/26/22-Edited and uploaded initial CP visit notes to pts. documents. FWD to CP for review.   Total time spent: 3 min.

## 2022-05-26 NOTE — Progress Notes (Signed)
Initial Pharmacist Visit (CCM)  Tiffany Vaughn,Tiffany Vaughn  82 years, Female  DOB: June 02, 1941  M: [phone]  __________________________________________________ Chronic Conditions Patient's Chronic Conditions: Hypertension (HTN), Hyperlipidemia/Dyslipidemia (HLD), Osteopenia or Osteoporosis, Chronic Kidney Disease (CKD), Depression, Allergic rhinitis, Vitamins/Supplements  Summary for PCP:  1. LDL above goal of 70. Patient to work on diet. PCP consider Zetia at future visit. 2. Counseled to continue weight bearing exercise 3 times weekly for Osteopenia. DEXA every 2-3 years. 3. Patient reports long term constipation with previous impactions. START Miralax EOD and stool softener daily. Call if no BM within 4 days.  Doctor and Hospital Visits Were there PCP Visits in last 6 months?: Yes PCP Visits details: 11/17/21-Dana Mull, NP-Pt. presented fo CPE. STOPPED Sertraline HCl 25 mg Oral Daily  03/04/22-Tonya Cranford, NP-Pt. presented for AWV and f/u. No medication changes noted.  03/26/22-Dana Mull, NP-Pt. presented for poison ivy. STARTED dexAMETHasone 4 MG Take 3 tabs for 3 days, 2 tabs for 3 days 1 tab for 5 days. Take with food.  Were there Specialist Visits in last 6 months?: Yes Specialist Visits details: 11/25/21-Thomas, Elby Showers, NP (Hematology and Oncology)-Pt. presented for f/u. MODIFIED Hydroxyurea 500 mg Oral As directed, TAKE 1 CAPSULE BY MOUTH daily 500 mg Oral As directed, TAKE 1 CAPSULE BY MOUTH MONDAY THROUGH FRIDAY, MAY TAKE WITH FOOD TO MINIMIZE GI SIDE EFFECTS.  01/06/22-Sherrill, Izola Price, MD (Oncology)-Pt. presented for f/u. No medication changes noted.  03/06/22-Mercado, Dayna Barker (Obstetrics and Gynecology)-No information available.  03/24/22-Thomas, Elby Showers, NP (Hematology and Oncology)-No information available. Was there a Hospital Visit in last 30 days?: No Were there other Hospital Visits in last 6 months?: No  Disease Assessments  Subjective Information Visit Completed  on: 05/25/2022 Did patient bring medications to appointment?: No What source (s) was used to reconcile medications this visit?: EMR list, Verbal recall from patient/caregiver Subjective: Very sweet lady presenting via phone for CCM initial visit. She is married, has 3 sons, and 3 grandkids. She enjoys being active, spending time with family, babysitting, and doing DIY projects around the house. She is very active. Lives in a 3 story house and is able to do all ADLs without trouble. She denies any pain or ailments. She still drives and recently made a 3 hour drive by herself to visit 1 son in [place]. Lifestyle habits: Diet: No formal diet Exercise: No formal right now with holidays. Her and husband usually go to [facility] 3 times per week. Tobacco: None Alcohol: None Caffeine: 2 cups of coffee Recreational drugs: None What is the patient's sleep pattern?: Trouble staying asleep How many hours per night does patient typically sleep?: 6-7  SDOH: [place] Needs Screening Tool ([email or web address]) SDOH questions were documented and reviewed (EMR or [employer]) within the past 12 months or since hospitalization?: No What is your living situation today? (ref #1): I have a steady place to live Think about the place you live. Do you have problems with any of the following? (ref #2): None of the above Within the past 12 months, you worried that your food would run out before you got money to buy more (ref #3): Never true Within the past 12 months, the food you bought just didn't last and you didn't have money to get more (ref #4): Never true In the past 12 months, has lack of reliable transportation kept you from medical appointments, meetings, work or from getting things needed for daily living? (ref #5): No In the past 12 months, has the  electric, gas, oil, or water company threatened to shut off services in  your home? (ref #6): No How often does anyone, including family and friends, physically hurt you? (ref #7): Never (1) How often does anyone, including family and friends, insult or talk down to you? (ref #8): Never (1) How often does anyone, including friends and family, threaten you with harm? (ref #9): Never (1) How often does anyone, including family and friends, scream or curse at you? (ref #10): Never (1)  Medication Adherence Does the [facility] have access to medication refill history?: Yes Medication adherence rates for STAR metric medications:  Atorvastatin '40mg'$ -01/10/22 (90DS),03/29/22 (90DS)  Medication adherence rates for non-STAR metric medications:  Clopidogrel '75mg'$ -01/21/22 (30DS), 03/10/22 (30DS) Metoprolol '25mg'$ -11/22/21 (90DS), 03/10/22 (90DS) Hydroxyurea '500mg'$ -03/10/22 (30DS), 04/03/22 (90DS)  Factors that may affect medication adherence?: Pill burden Name and location of Current pharmacy: [facility]  Current Rx insurance plan: [employer] Are meds synced by current pharmacy?: No Are meds delivered by current pharmacy?: No - delivery not available Would patient benefit from direct intervention of clinical lead in dispensing process to optimize clinical outcomes?: No Are [facility] pharmacy services available where patient lives?: Yes Is patient disadvantaged to use [facility]?: Yes Does patient experience delays in picking up medications due to transportation concerns (getting to pharmacy)?: No Medication organization: [employer] Additional Info: Husband manages medications. Assessment:: Adherent  Hypertension (HTN) Most Recent BP: 120/80 Most Recent HR: 61 taken on: 03/26/2022 Care Gap: Need BP documented or last BP 140/90 or higher: Addressed Assessed today?: No Drug: Metoprolol Succinate '25mg'$ -1 tab QD Pharmacist Assessment: Appropriate, Effective, Safe, Accessible  Hyperlipidemia/Dyslipidemia (HLD) Last Lipid panel on: 11/17/2021 TC (Goal<200): 184 LDL: 116 HDL (Goal>40):  47 TG (Goal<150): 104 ASCVD 10-year risk?is:: N/A due to Age > 76 Assessed today?: Yes LDL Goal: <70 We discussed: Complications of hyperlipidemia and prevention methods with patient for 5-10 minutes, Increasing exercise (walking, biking, swimming) to a goal of 30 minutes per day, as able based on current activity level and health or as directed by your healthcare provider, How a diet high in fruits/vegetables/nuts/whole grains/beans may help to reduce your cholesterol. Increasing soluble fiber intake.  Avoiding sugary foods and trans fat, limiting carbohydrates, and reducing portion sizes. Recommended increasing intake of healthy fats into their diet, Weight reduction- We discussed losing 5-10% of body weight Assessment:: Uncontrolled Drug: Atorvastatin '40mg'$ -1 tab QD Pharmacist Assessment: Appropriate, Query Effectiveness Plan/Follow up: Diet and lifestyle modifications discussed today.  Depression Most recent PHQ-9 Score: 0 taken on: 03/04/2022 Assessed today?: No Drug: None Plan/Follow up: In remission Allergies Assessed today?: No Drug: None  Chronic kidney disease (CKD) Most Recent GFR: 60 taken on: 01/21/2022 Previous GFR: 64 taken on: 11/17/2021 Most recent microalbumin ratio: 2 tested on: 11/17/2021 Assessed today?: No Drug: None  Osteopenia or Osteoporosis Most recent T-score: -1.6 taken on: 07/28/2021 Previous T-score: -1.8 taken on: 07/04/2018 Most recent Vitamin D 25-OH: 72 taken on: 11/17/2021 Previous Vitamin D 25-OH: 79 taken on: 11/17/2021 Assessed today?: Yes Patient has: Osteopenia In the past 12 months, have you fallen?: No Are there any stairs in or around the home?: Yes Are there any stairs with handrails?: Yes Is the home free of loose throw rugs in walkways, pet beds, electrical cords, etc.?: Yes Is there adequate lighting in your home to reduce the risk of falls?: Yes Prior fractures (include location and date): None Dietary calcium intake:  None We discussed: Weight bearing exercises (walking, light weights, resistance training), Dietary calcium intake, Fall prevention, Vitamin  D supplementation, Limiting caffeine intake Assessment:: Controlled Drug: Vitamin D 5000u daily Pharmacist Assessment: Appropriate, Effective, Safe, Accessible Plan/Follow up: Start Calcium Citrate '600mg'$  twice daily. Continue weight bearing exercise 3 times weekly. DEXA scans every 2-3 years.  Vitamins / Supplements We discussed: Lack of evidence for use of multivitamins, Confirming with pharmacist the safety of new vitamins / supplements with current medications before starting / adding to medication regimen Drug: Vitamin C '500mg'$  daily Pharmacist Assessment: Appropriate, Effective, Safe, Accessible Drug: B12 1032mg daily Pharmacist Assessment: Query Appropriateness Drug: Fish Oil  Pharmacist Assessment: Query Appropriateness Drug: Probiotic Pharmacist Assessment: Query Appropriateness Plan/Follow up: Recommended stopping supplements. Patient to think about it  Preventative Health Care Gap: Colorectal cancer screening: Addressed Care Gap: Breast cancer screening: Addressed Care Gap: Annual Wellness Visit (AWV): Addressed Immunizations needed: Influenza  Additional exercise counseling points. We discussed: targeting at least 150 minutes per week of moderate-intensity aerobic exercise., incorporating flexibility, balance, and strength training exercises, encouraging participation in insurance-covered exercise program through local gym (i.e. [employer]), aiming to lose 5 to 10% of body weight through lifestyle modifications Additional diet counseling points. We discussed: key components of the DASH diet, key components of a low-carb eating plan, aiming to consume at least 8 cups of water day, limiting caffeine intake  CPP Prep: 145m CPP Televisit: 2851mCPP Doc: 39m48mPP Care Plan: 4min52mummary Next CCM Follow Up: 6 months Next AWV:  02/18/23 at 11:00AM Next PCP Visit: 11/18/22 at 10:00AM  Attestation Statement:: CCM Services:  This encounter meets complex CCM services and moderate to high medical decision making.  Prior to outreach and patient consent for Chronic Care Management, I referred this patient for services after reviewing the nominated patient list or from a personal encounter with the patient.  I have personally reviewed this encounter including the documentation in this note and have collaborated with the care management provider regarding care management and care coordination activities to include development and update of the comprehensive care plan I am certifying that I agree with the content of this note and encounter as supervising physician.  Pharmacy Interventions Intervention Details Pharmacist Interventions discussed: Yes Discontinued Therapy: Inappropriate OTC use Started Therapy: Untreated medical condition, Preventative therapy, Immunizations recommended Education: Lifestyle modifications  AveryMarda StalkerrmD Clinical Pharmacist AveryNaida Sleighton'@[email or web address]'$ .care [phone]

## 2022-05-27 DIAGNOSIS — E7800 Pure hypercholesterolemia, unspecified: Secondary | ICD-10-CM | POA: Diagnosis not present

## 2022-05-27 DIAGNOSIS — M8589 Other specified disorders of bone density and structure, multiple sites: Secondary | ICD-10-CM | POA: Diagnosis not present

## 2022-06-02 ENCOUNTER — Other Ambulatory Visit: Payer: Self-pay | Admitting: Internal Medicine

## 2022-06-04 ENCOUNTER — Ambulatory Visit: Payer: Medicare HMO | Admitting: Nurse Practitioner

## 2022-06-11 ENCOUNTER — Encounter: Payer: Self-pay | Admitting: Internal Medicine

## 2022-06-16 ENCOUNTER — Inpatient Hospital Stay: Payer: Medicare HMO | Attending: Nurse Practitioner

## 2022-06-16 ENCOUNTER — Inpatient Hospital Stay: Payer: Medicare HMO | Admitting: Oncology

## 2022-06-16 VITALS — BP 114/72 | HR 74 | Temp 98.1°F | Resp 18 | Ht 64.5 in | Wt 128.4 lb

## 2022-06-16 DIAGNOSIS — I10 Essential (primary) hypertension: Secondary | ICD-10-CM | POA: Diagnosis not present

## 2022-06-16 DIAGNOSIS — E785 Hyperlipidemia, unspecified: Secondary | ICD-10-CM | POA: Insufficient documentation

## 2022-06-16 DIAGNOSIS — Z853 Personal history of malignant neoplasm of breast: Secondary | ICD-10-CM | POA: Diagnosis not present

## 2022-06-16 DIAGNOSIS — E538 Deficiency of other specified B group vitamins: Secondary | ICD-10-CM | POA: Insufficient documentation

## 2022-06-16 DIAGNOSIS — D473 Essential (hemorrhagic) thrombocythemia: Secondary | ICD-10-CM | POA: Diagnosis not present

## 2022-06-16 DIAGNOSIS — Z7902 Long term (current) use of antithrombotics/antiplatelets: Secondary | ICD-10-CM | POA: Insufficient documentation

## 2022-06-16 DIAGNOSIS — D75839 Thrombocytosis, unspecified: Secondary | ICD-10-CM

## 2022-06-16 DIAGNOSIS — Z923 Personal history of irradiation: Secondary | ICD-10-CM | POA: Diagnosis not present

## 2022-06-16 LAB — CBC WITH DIFFERENTIAL (CANCER CENTER ONLY)
Abs Immature Granulocytes: 0.01 10*3/uL (ref 0.00–0.07)
Basophils Absolute: 0 10*3/uL (ref 0.0–0.1)
Basophils Relative: 0 %
Eosinophils Absolute: 0.1 10*3/uL (ref 0.0–0.5)
Eosinophils Relative: 1 %
HCT: 44.4 % (ref 36.0–46.0)
Hemoglobin: 14.8 g/dL (ref 12.0–15.0)
Immature Granulocytes: 0 %
Lymphocytes Relative: 22 %
Lymphs Abs: 1.6 10*3/uL (ref 0.7–4.0)
MCH: 34.4 pg — ABNORMAL HIGH (ref 26.0–34.0)
MCHC: 33.3 g/dL (ref 30.0–36.0)
MCV: 103.3 fL — ABNORMAL HIGH (ref 80.0–100.0)
Monocytes Absolute: 0.5 10*3/uL (ref 0.1–1.0)
Monocytes Relative: 8 %
Neutro Abs: 4.9 10*3/uL (ref 1.7–7.7)
Neutrophils Relative %: 69 %
Platelet Count: 385 10*3/uL (ref 150–400)
RBC: 4.3 MIL/uL (ref 3.87–5.11)
RDW: 13.7 % (ref 11.5–15.5)
WBC Count: 7.1 10*3/uL (ref 4.0–10.5)
nRBC: 0 % (ref 0.0–0.2)

## 2022-06-16 NOTE — Progress Notes (Signed)
  [place] OFFICE PROGRESS NOTE   Diagnosis: Essential thrombocytosis  INTERVAL HISTORY:   Tiffany Vaughn returns as scheduled.  She feels well.  She is taking hydroxyurea.  No mouth sores, nausea, bleeding, rash, or symptom of thrombosis.  She is taking Plavix.  Objective:  Vital signs in last 24 hours:  Blood pressure 114/72, pulse 74, temperature 98.1 F (36.7 C), temperature source Oral, resp. rate 18, height 5' 4.5" (1.638 m), weight 128 lb 6.4 oz (58.2 kg), SpO2 98 %.    HEENT: 2 mm area of erythema at the right posterior palate and similar area at the anterior left buccal mucosa, no thrush Resp: Lungs clear bilaterally Cardio: Regular rate and rhythm GI: No hepatosplenomegaly Vascular: No leg edema  Lab Results:  Lab Results  Component Value Date   WBC 7.1 06/16/2022   HGB 14.8 06/16/2022   HCT 44.4 06/16/2022   MCV 103.3 (H) 06/16/2022   PLT 385 06/16/2022   NEUTROABS 4.9 06/16/2022    CMP  Lab Results  Component Value Date   NA 140 01/21/2022   K 4.4 01/21/2022   CL 103 01/21/2022   CO2 29 01/21/2022   GLUCOSE 107 (H) 01/21/2022   BUN 20 01/21/2022   CREATININE 0.95 01/21/2022   CALCIUM 10.2 01/21/2022   PROT 6.9 01/21/2022   ALBUMIN 4.7 01/21/2022   AST 22 01/21/2022   ALT 17 01/21/2022   ALKPHOS 93 01/21/2022   BILITOT 1.1 01/21/2022   GFRNONAA 60 (L) 01/21/2022   GFRAA 60 10/02/2020     Medications: I have reviewed the patient's current medications.   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 Hydroxyurea started 09/04/2021 Hydroxyurea increased to 500 mg Monday through Friday, 10/02/2021 Hydroxyurea continued 500 mg Monday through Friday, 10/28/2021 Hydroxyurea increased to 500 mg daily 11/25/2021 Hemoglobin upper end of normal range Acute infarct left centrum semiovale 07/25/2021, currently on Plavix and aspirin for 3 weeks then Plavix alone B12 deficiency, on oral replacement  Remote history  of right breast cancer, status post radiation, completed 5 years of tamoxifen History of transient global amnesia Hypertension Hyperlipidemia Multiple family members with malignancy   Disposition: Tiffany Vaughn appears stable.  She is tolerating hydroxyurea well.  The platelet count remains in goal range.  She will continue hydroxyurea at the current dose.  She will return for an office and lab visit in 4 months.  Betsy Coder, MD  06/16/2022  12:06 PM

## 2022-06-17 DIAGNOSIS — L578 Other skin changes due to chronic exposure to nonionizing radiation: Secondary | ICD-10-CM | POA: Diagnosis not present

## 2022-06-17 DIAGNOSIS — L820 Inflamed seborrheic keratosis: Secondary | ICD-10-CM | POA: Diagnosis not present

## 2022-06-17 DIAGNOSIS — L308 Other specified dermatitis: Secondary | ICD-10-CM | POA: Diagnosis not present

## 2022-06-17 DIAGNOSIS — L821 Other seborrheic keratosis: Secondary | ICD-10-CM | POA: Diagnosis not present

## 2022-07-21 DIAGNOSIS — I10 Essential (primary) hypertension: Secondary | ICD-10-CM | POA: Diagnosis not present

## 2022-07-21 DIAGNOSIS — Z8673 Personal history of transient ischemic attack (TIA), and cerebral infarction without residual deficits: Secondary | ICD-10-CM | POA: Diagnosis not present

## 2022-07-21 DIAGNOSIS — Z8249 Family history of ischemic heart disease and other diseases of the circulatory system: Secondary | ICD-10-CM | POA: Diagnosis not present

## 2022-07-21 DIAGNOSIS — Z7902 Long term (current) use of antithrombotics/antiplatelets: Secondary | ICD-10-CM | POA: Diagnosis not present

## 2022-07-21 DIAGNOSIS — Z803 Family history of malignant neoplasm of breast: Secondary | ICD-10-CM | POA: Diagnosis not present

## 2022-07-21 DIAGNOSIS — D75838 Other thrombocytosis: Secondary | ICD-10-CM | POA: Diagnosis not present

## 2022-07-21 DIAGNOSIS — Z008 Encounter for other general examination: Secondary | ICD-10-CM | POA: Diagnosis not present

## 2022-07-21 DIAGNOSIS — D8481 Immunodeficiency due to conditions classified elsewhere: Secondary | ICD-10-CM | POA: Diagnosis not present

## 2022-07-21 DIAGNOSIS — Z7964 Long term (current) use of myelosuppressive agent: Secondary | ICD-10-CM | POA: Diagnosis not present

## 2022-07-21 DIAGNOSIS — D84821 Immunodeficiency due to drugs: Secondary | ICD-10-CM | POA: Diagnosis not present

## 2022-07-21 DIAGNOSIS — E785 Hyperlipidemia, unspecified: Secondary | ICD-10-CM | POA: Diagnosis not present

## 2022-07-21 DIAGNOSIS — R32 Unspecified urinary incontinence: Secondary | ICD-10-CM | POA: Diagnosis not present

## 2022-07-21 DIAGNOSIS — Z823 Family history of stroke: Secondary | ICD-10-CM | POA: Diagnosis not present

## 2022-08-03 DIAGNOSIS — Z1231 Encounter for screening mammogram for malignant neoplasm of breast: Secondary | ICD-10-CM | POA: Diagnosis not present

## 2022-08-03 LAB — HM MAMMOGRAPHY

## 2022-08-18 ENCOUNTER — Encounter: Payer: Self-pay | Admitting: Neurology

## 2022-08-18 ENCOUNTER — Ambulatory Visit: Payer: Medicare HMO | Admitting: Neurology

## 2022-08-18 VITALS — BP 146/77 | HR 56 | Ht 64.0 in | Wt 128.0 lb

## 2022-08-18 DIAGNOSIS — I6381 Other cerebral infarction due to occlusion or stenosis of small artery: Secondary | ICD-10-CM

## 2022-08-18 DIAGNOSIS — G454 Transient global amnesia: Secondary | ICD-10-CM | POA: Diagnosis not present

## 2022-08-18 MED ORDER — CLOPIDOGREL BISULFATE 75 MG PO TABS: ORAL_TABLET | ORAL | 4 refills | Status: DC

## 2022-08-18 NOTE — Patient Instructions (Signed)
Continue current medications Follow-up with PCP Return as needed.

## 2022-08-18 NOTE — Progress Notes (Signed)
[facility]  PATIENT: Tiffany Vaughn DOB: 1941-04-29  REQUESTING CLINICIAN: Unk Pinto, MD HISTORY FROM: Patient and husband  REASON FOR VISIT: Transient global amnesia    HISTORICAL  CHIEF COMPLAINT:  Chief Complaint  Patient presents with   Follow-up    Rm 12 Pt states she is stable    INTERVAL HISTORY 08/18/2022:  Patient presents today for follow-up, she is accompanied by her husband.  Since last visit was a year ago,  she reports she is doing well, she is stable.  Currently she has no complaints.  She is taking her medications as prescribed, no side effects.  Reports her health is good, she sleeps well, she does exercise, goes to the [facility] at least 3 times per week.  Currently no complaints, no additional episode of amnesia.    HISTORY OF PRESENT ILLNESS:  This is a 82 year old woman past medical history of hypertension, hyperlipidemia, previous transient global amnesia 3 years ago in the setting of stressful incident, patient was called and told her that her grandkid was involved in a car accident, who is presenting after being admitted to the hospital for another episode of transient global amnesia.  Patient reported that she remember being in the yard doing some work and next thing that she knows, she is in the hospital.   Husband stated patient came to the house, she was out working in the yard, remove shoes off, told her that she was not feeling right, husband reported they had a normal discussion and they have decided to go to the ER together.  Husband said while in the ER, Patient became repetitive, asking the same questions over and over.  The entire episode lasted about 2 hours.  In the ED, her head CT was negative for any acute stroke but she did have a brain MRI that showed a possible tiny acute infarct in the left centrum semiovale.  She did also have a EEG which was negative for any seizures or abnormal abnormal discharges.  After a few hours  she was back to her baseline, and was discharged home.  Since being home patient denies any additional episode.  She is back to her normal self, at times she will complain of 2 out of 10 frontal headache for which she takes Tylenol as needed.  No other complaints.   OTHER MEDICAL CONDITIONS: Hypertension, hyperlipidemia, TGA   REVIEW OF SYSTEMS: Full 14 system review of systems performed and negative with exception of: as noted in the HPI   ALLERGIES: Allergies  Allergen Reactions   Codeine Nausea And Vomiting    "Feels like I have the flu"    HOME MEDICATIONS: Outpatient Medications Prior to Visit  Medication Sig Dispense Refill   Ascorbic Acid (VITAMIN C PO) Take 500 mg by mouth daily.     atorvastatin (LIPITOR) 40 MG tablet TAKE 1 TAB DAILY FOR CHOLESTEROL. 90 tablet 1   Cholecalciferol (VITAMIN D PO) Take 5,000 Units by mouth daily.      cyanocobalamin 1000 MCG tablet Take 1 tablet (1,000 mcg total) by mouth daily. 30 tablet 5   hydroxyurea (HYDREA) 500 MG capsule Take 1 capsule (500 mg total) by mouth daily. 90 capsule 0   metoprolol succinate (TOPROL-XL) 25 MG 24 hr tablet Take 1 tablet  Daily for BP                                                      /  TAKE                                 BY                     MOUTH 90 tablet 3   Omega-3 Fatty Acids (FISH OIL PO) Take 1,000 mg by mouth daily.     Probiotic Product (PROBIOTIC PO) Take 250 mg by mouth daily.     clopidogrel (PLAVIX) 75 MG tablet Take  1 tablet   Daily  to Prevent Blood Clots 90 tablet 3   No facility-administered medications prior to visit.    PAST MEDICAL HISTORY: Past Medical History:  Diagnosis Date   Cancer of right breast ([place]) 1992   Hyperlipidemia    Hypertension    Postural hypotension 11/14/2018   Transient global amnesia 11/14/2018   Vitamin D deficiency     PAST SURGICAL HISTORY: Past Surgical History:  Procedure Laterality Date   ABDOMINAL HYSTERECTOMY      partial   BREAST LUMPECTOMY Right    CATARACT EXTRACTION, BILATERAL Bilateral 2016   TONSILLECTOMY AND ADENOIDECTOMY      FAMILY HISTORY: Family History  Problem Relation Age of Onset   Heart attack Mother 32   Heart attack Father 33   Leukemia Brother    ALS Brother    Heart attack Brother    Cancer Maternal Aunt        NOS   Stomach cancer Maternal Grandmother        d. 81   Heart attack Maternal Grandfather    Leukemia Paternal Grandmother    Breast cancer Paternal Grandfather        d. 26   Breast cancer Niece 63   Leukemia Other 74       AML   Leukemia Other 13       d. 76    SOCIAL HISTORY: Social History   Socioeconomic History   Marital status: Married    Spouse name: Maryann Conners   Number of children: 2   Years of education: Not on file   Highest education level: Not on file  Occupational History   Not on file  Tobacco Use   Smoking status: Never   Smokeless tobacco: Never  Vaping Use   Vaping Use: Never used  Substance and Sexual Activity   Alcohol use: No   Drug use: No   Sexual activity: Not Currently  Other Topics Concern   Not on file  Social History Narrative   Not on file   Social Determinants of Health   Financial Resource Strain: Not on file  Food Insecurity: Not on file  Transportation Needs: Not on file  Physical Activity: Not on file  Stress: Not on file  Social Connections: Not on file  Intimate Partner Violence: Not on file    PHYSICAL EXAM  GENERAL EXAM/CONSTITUTIONAL: Vitals:  Vitals:   08/18/22 0958  BP: (!) 146/77  Pulse: (!) 56  Weight: 128 lb (58.1 kg)  Height: 5' 4"$  (1.626 m)    Body mass index is 21.97 kg/m. Wt Readings from Last 3 Encounters:  08/18/22 128 lb (58.1 kg)  06/16/22 128 lb 6.4 oz (58.2 kg)  03/26/22 129 lb 12.8 oz (58.9 kg)   Patient is in no distress; well developed, nourished and groomed; neck is supple  EYES:  Visual fields full to confrontation, Extraocular movements intacts,    MUSCULOSKELETAL:  Gait, strength, tone, movements noted in Neurologic exam below  NEUROLOGIC: MENTAL STATUS:     08/18/2021    8:32 AM  MMSE - Mini Mental State Exam  Orientation to time 5  Orientation to Place 5  Registration 3  Attention/ Calculation 5  Recall 3  Language- name 2 objects 2  Language- repeat 1  Language- follow 3 step command 3  Language- read & follow direction 1  Write a sentence 1  Copy design 1  Total score 30   awake, alert, oriented to person, place and time recent and remote memory intact normal attention and concentration language fluent, comprehension intact, naming intact fund of knowledge appropriate  CRANIAL NERVE:  2nd, 3rd, 4th, 6th - visual fields full to confrontation, extraocular muscles intact, no nystagmus 5th - facial sensation symmetric 7th - facial strength symmetric 8th - hearing intact 9th - palate elevates symmetrically, uvula midline 11th - shoulder shrug symmetric 12th - tongue protrusion midline  MOTOR:  normal bulk and tone, full strength in the BUE, BLE  SENSORY:  normal and symmetric to light touch, pinprick, temperature, vibration  COORDINATION:  finger-nose-finger, fine finger movements normal  REFLEXES:  deep tendon reflexes present and symmetric  GAIT/STATION:  normal   DIAGNOSTIC DATA (LABS, IMAGING, TESTING) - I reviewed patient records, labs, notes, testing and imaging myself where available.  Lab Results  Component Value Date   WBC 7.1 06/16/2022   HGB 14.8 06/16/2022   HCT 44.4 06/16/2022   MCV 103.3 (H) 06/16/2022   PLT 385 06/16/2022      Component Value Date/Time   NA 140 01/21/2022 1113   K 4.4 01/21/2022 1113   CL 103 01/21/2022 1113   CO2 29 01/21/2022 1113   GLUCOSE 107 (H) 01/21/2022 1113   BUN 20 01/21/2022 1113   CREATININE 0.95 01/21/2022 1113   CREATININE 0.90 11/17/2021 1107   CALCIUM 10.2 01/21/2022 1113   PROT 6.9 01/21/2022 1113   ALBUMIN 4.7 01/21/2022 1113   AST  22 01/21/2022 1113   ALT 17 01/21/2022 1113   ALKPHOS 93 01/21/2022 1113   BILITOT 1.1 01/21/2022 1113   GFRNONAA 60 (L) 01/21/2022 1113   GFRNONAA 52 (L) 10/02/2020 1350   GFRAA 60 10/02/2020 1350   Lab Results  Component Value Date   CHOL 184 11/17/2021   HDL 47 (L) 11/17/2021   LDLCALC 116 (H) 11/17/2021   TRIG 104 11/17/2021   CHOLHDL 3.9 11/17/2021   Lab Results  Component Value Date   HGBA1C 6.0 (H) 11/17/2021   Lab Results  Component Value Date   [id number] 07/30/2021   Lab Results  Component Value Date   TSH 1.23 11/17/2021    MRI Brain 07/25/2021 Possible tiny acute infarct in the left centrum semiovale. Otherwise, normal for age brain MRI.  EEG 07/25/2021 This study is within normal limits. No seizures or epileptiform discharges were seen throughout the recording.  ASSESSMENT AND PLAN  82 y.o. year old female with hypertension, hyperlipidemia, previous transient global amnesia who is presenting for follow up.  Overall she is doing well, denies any additional episode of amnesia, no complaint of memory loss.  She is compliant with her medications, denies any headaches, no visual changes, no focal neurological deficit, she does exercise.  At this time I will recommend patient to continue follow-up with PCP and to return as needed.  She voices understanding.    1. TGA (transient global amnesia)   2. Cerebrovascular accident (CVA) due to occlusion of  small artery [facility])      Patient Instructions  Continue current medications Follow-up with PCP Return as needed.  No orders of the defined types were placed in this encounter.   Meds ordered this encounter  Medications   clopidogrel (PLAVIX) 75 MG tablet    Sig: Take  1 tablet   Daily  to Prevent Blood Clots    Dispense:  90 tablet    Refill:  4    Return if symptoms worsen or fail to improve.  I have spent a total of 30 minutes dedicated to this patient today, preparing to see patient, performing a  medically appropriate examination and evaluation, ordering tests and/or medications and procedures, and counseling and educating the patient/family/caregiver; independently interpreting result and communicating results to the family/patient/caregiver; and documenting clinical information in the electronic medical record.   Alric Ran, MD 08/18/2022, 10:34 AM  [facility] [address] [place], [place] [address] [phone]

## 2022-08-23 ENCOUNTER — Other Ambulatory Visit: Payer: Self-pay | Admitting: Nurse Practitioner

## 2022-08-23 DIAGNOSIS — D75839 Thrombocytosis, unspecified: Secondary | ICD-10-CM

## 2022-09-06 ENCOUNTER — Encounter: Payer: Self-pay | Admitting: Internal Medicine

## 2022-09-10 ENCOUNTER — Telehealth: Payer: Self-pay

## 2022-09-10 NOTE — Progress Notes (Signed)
Reviewing patients chart prior to assessment call. Reviewed office visits, specialists visits, hospital visits, medications, adherence, and labs. Spoke with patient and she reports that she is doing great with no questions or concerns. I attempted to scheduled her for a outreach call and she did not wish to schedule. No openings in April and she did not want to schedule in may because she is going to see [facility] 11/18/22. She states that she is dong well and does not need anything currently. I advised her to call with any questions or concerns if any arise.   Time spent: 15 min.  Hildred Alamin, CTL

## 2022-10-15 ENCOUNTER — Inpatient Hospital Stay: Payer: Medicare HMO | Attending: Oncology

## 2022-10-15 ENCOUNTER — Inpatient Hospital Stay: Payer: Medicare HMO | Admitting: Oncology

## 2022-10-15 VITALS — BP 120/76 | HR 64 | Temp 98.1°F | Resp 20 | Ht 64.0 in | Wt 129.0 lb

## 2022-10-15 DIAGNOSIS — I10 Essential (primary) hypertension: Secondary | ICD-10-CM | POA: Diagnosis not present

## 2022-10-15 DIAGNOSIS — D473 Essential (hemorrhagic) thrombocythemia: Secondary | ICD-10-CM | POA: Insufficient documentation

## 2022-10-15 DIAGNOSIS — D75839 Thrombocytosis, unspecified: Secondary | ICD-10-CM | POA: Diagnosis not present

## 2022-10-15 DIAGNOSIS — E785 Hyperlipidemia, unspecified: Secondary | ICD-10-CM | POA: Insufficient documentation

## 2022-10-15 DIAGNOSIS — Z923 Personal history of irradiation: Secondary | ICD-10-CM | POA: Insufficient documentation

## 2022-10-15 DIAGNOSIS — Y93H2 Activity, gardening and landscaping: Secondary | ICD-10-CM | POA: Insufficient documentation

## 2022-10-15 DIAGNOSIS — Z853 Personal history of malignant neoplasm of breast: Secondary | ICD-10-CM | POA: Diagnosis not present

## 2022-10-15 DIAGNOSIS — Y92017 Garden or yard in single-family (private) house as the place of occurrence of the external cause: Secondary | ICD-10-CM | POA: Insufficient documentation

## 2022-10-15 DIAGNOSIS — E538 Deficiency of other specified B group vitamins: Secondary | ICD-10-CM | POA: Diagnosis not present

## 2022-10-15 DIAGNOSIS — W57XXXA Bitten or stung by nonvenomous insect and other nonvenomous arthropods, initial encounter: Secondary | ICD-10-CM | POA: Insufficient documentation

## 2022-10-15 LAB — CBC WITH DIFFERENTIAL (CANCER CENTER ONLY)
Abs Immature Granulocytes: 0.01 10*3/uL (ref 0.00–0.07)
Basophils Absolute: 0 10*3/uL (ref 0.0–0.1)
Basophils Relative: 0 %
Eosinophils Absolute: 0.1 10*3/uL (ref 0.0–0.5)
Eosinophils Relative: 2 %
HCT: 43.2 % (ref 36.0–46.0)
Hemoglobin: 14.5 g/dL (ref 12.0–15.0)
Immature Granulocytes: 0 %
Lymphocytes Relative: 29 %
Lymphs Abs: 1.8 10*3/uL (ref 0.7–4.0)
MCH: 34.7 pg — ABNORMAL HIGH (ref 26.0–34.0)
MCHC: 33.6 g/dL (ref 30.0–36.0)
MCV: 103.3 fL — ABNORMAL HIGH (ref 80.0–100.0)
Monocytes Absolute: 0.6 10*3/uL (ref 0.1–1.0)
Monocytes Relative: 10 %
Neutro Abs: 3.6 10*3/uL (ref 1.7–7.7)
Neutrophils Relative %: 59 %
Platelet Count: 408 10*3/uL — ABNORMAL HIGH (ref 150–400)
RBC: 4.18 MIL/uL (ref 3.87–5.11)
RDW: 13.8 % (ref 11.5–15.5)
WBC Count: 6.2 10*3/uL (ref 4.0–10.5)
nRBC: 0 % (ref 0.0–0.2)

## 2022-10-15 LAB — CMP (CANCER CENTER ONLY)
ALT: 14 U/L (ref 0–44)
AST: 19 U/L (ref 15–41)
Albumin: 4.5 g/dL (ref 3.5–5.0)
Alkaline Phosphatase: 104 U/L (ref 38–126)
Anion gap: 6 (ref 5–15)
BUN: 17 mg/dL (ref 8–23)
CO2: 27 mmol/L (ref 22–32)
Calcium: 9.7 mg/dL (ref 8.9–10.3)
Chloride: 107 mmol/L (ref 98–111)
Creatinine: 0.91 mg/dL (ref 0.44–1.00)
GFR, Estimated: >60 mL/min (ref >60)
Glucose, Bld: 94 mg/dL (ref 70–99)
Potassium: 4.4 mmol/L (ref 3.5–5.1)
Sodium: 140 mmol/L (ref 135–145)
Total Bilirubin: 0.7 mg/dL (ref 0.3–1.2)
Total Protein: 6.8 g/dL (ref 6.5–8.1)

## 2022-10-15 NOTE — Progress Notes (Signed)
  Tiffany Vaughn OFFICE PROGRESS NOTE   Diagnosis: Essential thrombocytosis  INTERVAL HISTORY:   Tiffany Vaughn returns as scheduled.  She continues hydroxyurea.  No bleeding or symptom of venous thrombosis.  No symptom of a new stroke. She had a "tick bite "2 days ago while in her yard.  A part of the tick remains embedded at the abdominal wall.  No fever or associated symptoms.  Objective:  Vital signs in last 24 hours:  Blood pressure 120/76, pulse 64, temperature 98.1 F (36.7 C), temperature source Oral, resp. rate 20, height  (1.626 m), weight 129 lb (58.5 kg), SpO2 98 %.    HEENT: No thrush or ulcers Resp: Lungs clear bilaterally Cardio: Regular rate and rhythm GI: No hepatosplenomegaly Vascular: No leg edema  Skin: 1-2 mm black embedded portion of a tick at the right abdominal wall, very faint 4-5 mm surrounding erythema  Lab Results:  Lab Results  Component Value Date   WBC 6.2 10/15/2022   HGB 14.5 10/15/2022   HCT 43.2 10/15/2022   MCV 103.3 (H) 10/15/2022   PLT 408 (H) 10/15/2022   NEUTROABS 3.6 10/15/2022    CMP  Lab Results  Component Value Date   NA 140 10/15/2022   K 4.4 10/15/2022   CL 107 10/15/2022   CO2 27 10/15/2022   GLUCOSE 94 10/15/2022   BUN 17 10/15/2022   CREATININE 0.91 10/15/2022   CALCIUM 9.7 10/15/2022   PROT 6.8 10/15/2022   ALBUMIN 4.5 10/15/2022   AST 19 10/15/2022   ALT 14 10/15/2022   ALKPHOS 104 10/15/2022   BILITOT 0.7 10/15/2022   GFRNONAA >60 10/15/2022   GFRAA 60 10/02/2020     Medications: I have reviewed the patient's current medications.   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 Hydroxyurea started 09/04/2021 Hydroxyurea increased to 500 mg Monday through Friday, 10/02/2021 Hydroxyurea continued 500 mg Monday through Friday, 10/28/2021 Hydroxyurea increased to 500 mg daily 11/25/2021 Hemoglobin upper end of normal range Acute infarct left centrum semiovale  07/25/2021, currently on Plavix and aspirin for 3 weeks then Plavix alone B12 deficiency, on oral replacement  Remote history of right breast cancer, status post radiation, completed 5 years of tamoxifen History of transient global amnesia Hypertension Hyperlipidemia Multiple family members with malignancy     Disposition: Tiffany Vaughn is stable from a hematologic standpoint.  The platelet count remains at goal range.  She will continue hydroxyurea at the current dose.  She will return for lab visit in 3 months and an office visit in 6 months.  She will attempt to remove the remaining portion of the tick from the abdominal wall.  She will seek medical attention for erythema at the tick bite site or development of new symptoms.  Thornton Papas, MD  10/15/2022  11:26 AM

## 2022-10-18 ENCOUNTER — Encounter: Payer: Self-pay | Admitting: Nurse Practitioner

## 2022-10-18 ENCOUNTER — Ambulatory Visit (INDEPENDENT_AMBULATORY_CARE_PROVIDER_SITE_OTHER): Payer: Medicare HMO | Admitting: Nurse Practitioner

## 2022-10-18 VITALS — BP 118/72 | HR 76 | Temp 97.4°F | Ht 64.0 in | Wt 132.6 lb

## 2022-10-18 DIAGNOSIS — S30811A Abrasion of abdominal wall, initial encounter: Secondary | ICD-10-CM | POA: Diagnosis not present

## 2022-10-18 DIAGNOSIS — W57XXXA Bitten or stung by nonvenomous insect and other nonvenomous arthropods, initial encounter: Secondary | ICD-10-CM

## 2022-10-18 DIAGNOSIS — L539 Erythematous condition, unspecified: Secondary | ICD-10-CM | POA: Diagnosis not present

## 2022-10-18 DIAGNOSIS — S30861A Insect bite (nonvenomous) of abdominal wall, initial encounter: Secondary | ICD-10-CM | POA: Diagnosis not present

## 2022-10-18 MED ORDER — DOXYCYCLINE HYCLATE 100 MG PO CAPS: ORAL_CAPSULE | ORAL | 0 refills | Status: DC

## 2022-10-18 NOTE — Patient Instructions (Signed)
Tick Bite Information, Adult  Ticks are insects that can bite. Most ticks live in bushes and grassy areas. They climb onto people and animals that go by. Then they bite. Some ticks carry germs that can make you sick. How can I prevent tick bites? Take these steps: Before you go outside: Wear long sleeves and long pants. Wear light-colored clothes. Tuck your pant legs into your socks. Use an insect repellent that has 20% or higher of the ingredients DEET, picaridin, or IR3535. Follow the instructions on the label. Put it on: Bare skin. Avoid your eyes and mouth areas. The tops of your boots. Your pant legs. The ends of your sleeves. If you use an insect repellent that has the ingredient permethrin, follow the instructions on the label. Do not put permethrin on the skin. Put it on: Clothing. Shoes. Outdoor gear. Tents. When you are outside Avoid walking through long grass. Stay in the middle of the trail. Do not touch the bushes. Check for ticks on your clothes, hair, and skin often while you are outside. Check again before you go inside. When you go indoors Check your clothes for ticks. Dry your clothes in a dryer on high heat for 10 minutes or more. If clothes are damp, more time may be needed. Wash your clothes right away if they need to be washed. Use hot water. Check your pets and outdoor gear. Shower right away. Check your body for ticks. Do a full body check using a mirror. Check your clothes, skin, head, neck, armpits, waist, groin, and joint areas. What is the right way to remove a tick? Remove the tick from your skin as soon as possible. Do not remove the tick with your bare fingers. Do not try to remove a tick with heat, alcohol, petroleum jelly, or fingernail polish. To remove a tick that is crawling on your skin: Go outside and brush the tick off. Use tape or a lint roller. To remove a tick that is biting: Wash your hands. If you have gloves, put them on. Use tweezers,  curved forceps, or a tick-removal tool to grasp the tick. Grasp the tick as close to your skin and as close to the tick's head as possible. Gently pull up until the tick lets go. Try to keep the tick's head attached to its body. Do not twist or jerk the tick. Do not squeeze or crush the tick. What should I do after taking out a tick? Clean the bite area and your hands with soap and water, rubbing alcohol, or an iodine wash. If you have an antiseptic cream or ointment, put a small amount on the bite area. Wash and disinfect any instruments that you used to remove the tick. How should I get rid of a live tick? To get rid of a live tick, use one of these methods: Place the tick in rubbing alcohol. Place it in a bag or container you can close tightly. Throw it away. Wrap it tightly in tape. Throw it away. Flush it down the toilet. Where to find more information Centers for Disease Control and Prevention: cdc.gov/ticks U.S. Environmental Protection Agency: epa.gov/insect-repellents Contact a doctor if: You have a fever or chills. You have a red rash that makes a circle (bull's-eye rash) in the bite area. You have redness and swelling where the tick bit you. You have a headache or stiff neck. You have pain in a muscle, joint, or bone. You are more tired than normal. You have trouble walking or   moving your legs. You have numbness in your legs. You have tender or swollen lymph glands. You have belly (abdominal) pain, vomiting, watery poop (diarrhea), or weight loss. Get help right away if: You cannot remove a tick. You cannot move (have paralysis) or feel weak. You are feeling worse or have new symptoms. You find a tick that is biting you and filled with blood, especially if you are in an area where diseases from ticks are common. Summary Ticks may carry germs that can make you sick. To prevent tick bites wear long sleeves, long pants, and light colors. Use insect repellent. Follow the  instructions on the label. If the tick is biting, remove it right away. Do not try to remove it with heat, alcohol, petroleum jelly, or fingernail polish. Contact a doctor if you have symptoms of a disease after being bitten by a tick. This information is not intended to replace advice given to you by your health care provider. Make sure you discuss any questions you have with your health care provider. Document Revised: 09/14/2021 Document Reviewed: 09/14/2021 Elsevier Patient Education  2023 Elsevier Inc.  

## 2022-10-18 NOTE — Progress Notes (Signed)
Assessment and Plan:  Tiffany Vaughn was seen today for a episodic visit.  Diagnoses and all order for this visit:  Tick bite of abdominal wall, initial encounter Start tmt with Doxycycline as directed. Possible lone start tick - monitor for allergy to meat (hives/N/V).  - doxycycline (VIBRAMYCIN) 100 MG capsule; Take 1 capsule 2 x /day with meals for Infection  Dispense: 10 capsule; Refill: 0  Abrasion of abdominal wall, initial encounter May place topical triple abx ointment over area and cover with bandage  Localized erythema Continue to monitor for increase in redness, swelling, streaking, pain, drainage, warmth, fever, chills, N/V.    Notify office is s/s fail to improve.   Notify office for further evaluation and treatment, questions or concerns if s/s fail to improve. The risks and benefits of my recommendations, as well as other treatment options were discussed with the patient today. Questions were answered.  Further disposition pending results of labs. Discussed med's effects and SE's.    Over 15 minutes of exam, counseling, chart review, and critical decision making was performed.   Future Appointments  Date Time Provider Department Center  11/18/2022  9:00 AM Adela Glimpse, NP [facility] None  02/11/2023 10:45 AM [facility] PHLEBOTOMIST [facility] None  02/18/2023 11:00 AM Adela Glimpse, NP [facility] None  04/15/2023 10:45 AM [facility] PHLEBOTOMIST [facility] None  04/15/2023 11:20 AM Ladene Artist, MD [facility] None    ------------------------------------------------------------------------------------------------------------------   HPI BP 118/72   Pulse 76   Temp (!) 97.4 F (36.3 C)   Ht  (1.626 m)   Wt 132 lb 9.6 oz (60.1 kg)   SpO2 98%   BMI 22.76 kg/m   82 y.o.female presents alongside husband for evaluation of tick bite that occurred 6 days ago.  States she was in her yard the day prior.  She does not know when she got bit.  She had  showered the next morning and found the tick slightly engorged.  Husband removed tick but felt as though he did not remove head.  Patient and husband have been using tweezers to try to remove any remnants.  Area now has become sore and red.  No streaking, drainage.  They have been using peroxide to the area.  Patient denies fever, chills, N/V.  Patient states that the tick did have a small white circle on the back but husband feels as though it was  more of a seed tick than a lone star tick.   Past Medical History:  Diagnosis Date   Cancer of right breast 1992   Hyperlipidemia    Hypertension    Postural hypotension 11/14/2018   Transient global amnesia 11/14/2018   Vitamin D deficiency      Allergies  Allergen Reactions   Codeine Nausea And Vomiting    "Feels like I have the flu"    Current Outpatient Medications on File Prior to Visit  Medication Sig   Ascorbic Acid (VITAMIN C PO) Take 500 mg by mouth daily.   atorvastatin (LIPITOR) 40 MG tablet TAKE 1 TAB DAILY FOR CHOLESTEROL.   Cholecalciferol (VITAMIN D PO) Take 5,000 Units by mouth daily.    clopidogrel (PLAVIX) 75 MG tablet Take  1 tablet   Daily  to Prevent Blood Clots   cyanocobalamin 1000 MCG tablet Take 1 tablet (1,000 mcg total) by mouth daily.   hydroxyurea (HYDREA) 500 MG capsule Take 1 capsule by mouth once daily   metoprolol succinate (TOPROL-XL) 25 MG 24 hr tablet Take 1 tablet  Daily for BP                                                      /                            TAKE                                 BY                     MOUTH   Omega-3 Fatty Acids (FISH OIL PO) Take 1,000 mg by mouth daily.   Probiotic Product (PROBIOTIC PO) Take 250 mg by mouth daily.   No current facility-administered medications on file prior to visit.    ROS: all negative except what is noted in the HPI.   Physical Exam:  BP 118/72   Pulse 76   Temp (!) 97.4 F (36.3 C)   Ht  (1.626 m)   Wt 132 lb 9.6 oz (60.1 kg)   SpO2  98%   BMI 22.76 kg/m   General Appearance: NAD.  Awake, conversant and cooperative. Eyes: PERRLA, EOMs intact.  Sclera white.  Conjunctiva without erythema. Sinuses: No frontal/maxillary tenderness.  No nasal discharge. Nares patent.  ENT/Mouth: Ext aud canals clear.  Bilateral TMs w/DOL and without erythema or bulging. Hearing intact.  Posterior pharynx without swelling or exudate.  Tonsils without swelling or erythema.  Neck: Supple.  No masses, nodules or thyromegaly. Respiratory: Effort is regular with non-labored breathing. Breath sounds are equal bilaterally without rales, rhonchi, wheezing or stridor.  Cardio: RRR with no MRGs. Brisk peripheral pulses without edema.  Abdomen: Active BS in all four quadrants.  Soft and non-tender without guarding, rebound tenderness, hernias or masses. Lymphatics: Non tender without lymphadenopathy.  Musculoskeletal: Full ROM, 5/5 strength, normal ambulation.  No clubbing or cyanosis. Skin: RLQ abdomen with small round flat approximately 0.5 cm abrasion with localized erythema.  No drainage or pustule. Surrounding skin appropriate color for ethnicity and WNL. Neuro: CN II-XII grossly normal. Normal muscle tone without cerebellar symptoms and intact sensation.   Psych: AO X 3,  appropriate mood and affect, insight and judgment.     Adela Glimpse, NP 3:13 PM [facility]

## 2022-11-18 ENCOUNTER — Encounter: Payer: Medicare HMO | Admitting: Nurse Practitioner

## 2022-11-22 ENCOUNTER — Other Ambulatory Visit: Payer: Self-pay | Admitting: Oncology

## 2022-11-22 DIAGNOSIS — D75839 Thrombocytosis, unspecified: Secondary | ICD-10-CM

## 2022-12-01 ENCOUNTER — Ambulatory Visit (INDEPENDENT_AMBULATORY_CARE_PROVIDER_SITE_OTHER): Payer: Medicare HMO | Admitting: Nurse Practitioner

## 2022-12-01 ENCOUNTER — Encounter: Payer: Self-pay | Admitting: Nurse Practitioner

## 2022-12-01 VITALS — BP 112/70 | HR 85 | Temp 97.9°F | Resp 16 | Ht 64.0 in | Wt 127.4 lb

## 2022-12-01 DIAGNOSIS — Z79899 Other long term (current) drug therapy: Secondary | ICD-10-CM | POA: Diagnosis not present

## 2022-12-01 DIAGNOSIS — Z131 Encounter for screening for diabetes mellitus: Secondary | ICD-10-CM

## 2022-12-01 DIAGNOSIS — E559 Vitamin D deficiency, unspecified: Secondary | ICD-10-CM | POA: Diagnosis not present

## 2022-12-01 DIAGNOSIS — Z0000 Encounter for general adult medical examination without abnormal findings: Secondary | ICD-10-CM | POA: Diagnosis not present

## 2022-12-01 DIAGNOSIS — Z136 Encounter for screening for cardiovascular disorders: Secondary | ICD-10-CM | POA: Diagnosis not present

## 2022-12-01 DIAGNOSIS — I10 Essential (primary) hypertension: Secondary | ICD-10-CM

## 2022-12-01 DIAGNOSIS — Z1389 Encounter for screening for other disorder: Secondary | ICD-10-CM | POA: Diagnosis not present

## 2022-12-01 DIAGNOSIS — R7309 Other abnormal glucose: Secondary | ICD-10-CM

## 2022-12-01 DIAGNOSIS — L237 Allergic contact dermatitis due to plants, except food: Secondary | ICD-10-CM

## 2022-12-01 DIAGNOSIS — Z1329 Encounter for screening for other suspected endocrine disorder: Secondary | ICD-10-CM

## 2022-12-01 DIAGNOSIS — E538 Deficiency of other specified B group vitamins: Secondary | ICD-10-CM | POA: Diagnosis not present

## 2022-12-01 DIAGNOSIS — Z13220 Encounter for screening for lipoid disorders: Secondary | ICD-10-CM

## 2022-12-01 DIAGNOSIS — N1831 Chronic kidney disease, stage 3a: Secondary | ICD-10-CM | POA: Diagnosis not present

## 2022-12-01 DIAGNOSIS — M8589 Other specified disorders of bone density and structure, multiple sites: Secondary | ICD-10-CM

## 2022-12-01 DIAGNOSIS — Z1211 Encounter for screening for malignant neoplasm of colon: Secondary | ICD-10-CM

## 2022-12-01 DIAGNOSIS — Z853 Personal history of malignant neoplasm of breast: Secondary | ICD-10-CM

## 2022-12-01 DIAGNOSIS — J302 Other seasonal allergic rhinitis: Secondary | ICD-10-CM

## 2022-12-01 DIAGNOSIS — Z0001 Encounter for general adult medical examination with abnormal findings: Secondary | ICD-10-CM

## 2022-12-01 DIAGNOSIS — F325 Major depressive disorder, single episode, in full remission: Secondary | ICD-10-CM

## 2022-12-01 DIAGNOSIS — Z6822 Body mass index (BMI) 22.0-22.9, adult: Secondary | ICD-10-CM

## 2022-12-01 MED ORDER — PREDNISONE 10 MG PO TABS
ORAL_TABLET | ORAL | 0 refills | Status: DC
Start: 2022-12-01 — End: 2023-05-06

## 2022-12-01 NOTE — Progress Notes (Signed)
ANNUAL CPE  Assessment:   Annual Exam Due yearly Health maintenance reviewed and dicussed  Vitamin D deficiency Continue supplement for goal of 60-100 Monitor Vitamin D levels  Depression, remission (HCC) In remission off of medications  History of cancer of right breast Continue close follow ups, mammogram annually  Osteopenia Pursue a combination of weight-bearing exercises and strength training. Advised on fall prevention measures including proper lighting in all rooms, removal of area rugs and floor clutter, use of walking devices as deemed appropriate, avoidance of uneven walking surfaces. Smoking cessation and moderate alcohol consumption if applicable Consume 800 to 1000 IU of vitamin D daily with a goal vitamin D serum value of 30 ng/mL or higher. Aim for 1000 to 1200 mg of elemental calcium daily through supplements and/or dietary sources.  Seasonal allergies Continue OTC allergy pills Avoid triggers  Other abnormal glucose Education: Reviewed 'ABCs' of diabetes management  Discussed goals to be met and/or maintained include A1C (<7) Blood pressure (<130/80) Cholesterol (LDL <70) Continue Eye Exam yearly  Continue Dental Exam Q6 mo Discussed dietary recommendations Discussed Physical Activity recommendations  BMI 22 Discussed appropriate BMI Diet modification. Physical activity. Encouraged/praised to build confidence.  CKD 3a (HCC) Discussed how what you eat and drink can aide in kidney protection. Stay well hydrated. Avoid high salt foods. Avoid NSAIDS. Keep BP and BG well controlled.   Take medications as prescribed. Remain active and exercise as tolerated daily. Maintain weight.  Continue to monitor.  Screening for blood or protein in urine Check and monitor Urinalysis/Microalbumin Routine w reflex microscopic   Screening for Cardiovascular Condition  Monitor EKG   Screening for cholesterol Monitor lipids  Screening for DM Monitor  insulin/A1c  Screening for thyroid disorder Monitor TSH  Poison Ivy Start prednisone taper OTC antihistamine  B12 Deficiency Monitor levels  Screening for colon cancer Cologuard  Orders Placed This Encounter  Procedures   CBC with Differential/Platelet   COMPLETE METABOLIC PANEL WITH GFR   Magnesium   Lipid panel   TSH   Hemoglobin A1c   Insulin, random   VITAMIN D 25 Hydroxy (Vit-D Deficiency, Fractures)   Urinalysis, Routine w reflex microscopic   Microalbumin / creatinine urine ratio   Vitamin B12   Cologuard   EKG 12-Lead     Notify office for further evaluation and treatment, questions or concerns if any reported s/s fail to improve.   The patient was advised to call back or seek an in-person evaluation if any symptoms worsen or if the condition fails to improve as anticipated.   Further disposition pending results of labs. Discussed med's effects and SE's.    I discussed the assessment and treatment plan with the patient. The patient was provided an opportunity to ask questions and all were answered. The patient agreed with the plan and demonstrated an understanding of the instructions.  Discussed med's effects and SE's. Screening labs and tests as requested with regular follow-up as recommended.  I provided 35 minutes of face-to-face time during this encounter including counseling, chart review, and critical decision making was preformed.  Today's Plan of Care is based on a patient-centered health care approach known as shared decision making - the decisions, tests and treatments allow for patient preferences and values to be balanced with clinical evidence.     Future Appointments  Date Time Provider Department Center  02/11/2023 10:45 AM [facility] PHLEBOTOMIST [facility] None  03/14/2023 10:30 AM Adela Glimpse, NP [facility] None  04/15/2023 10:45 AM [facility] PHLEBOTOMIST [facility] None  04/15/2023 11:20 AM Ladene Artist, MD [facility] None  12/01/2023   2:00 PM Adela Glimpse, NP [facility] None    Plan:   During the course of the visit the patient was educated and counseled about appropriate screening and preventive services including:   Pneumococcal vaccine  Influenza vaccine Td vaccine Screening electrocardiogram Screening mammography Bone densitometry screening Colorectal cancer screening Diabetes screening Glaucoma screening Nutrition counseling  Advanced directives: given information/requested  Subjective:   Tiffany Vaughn is a 82 y.o. female who presents for annual CPE and follow up for HTN, chol, weight, vitamin D def. In May 2020,  patient was hospitalized to r/o CVA and was diagnosed with TGA and Brain scans were negative.  Overall she reports feeling well today.  She has been working outside in her garden.  She has poison ivy along her BLE extremity, shin area.  Has been spreading up her legs and to her BUE forearms.  She has taken OTC antihistamine with minimal effectiveness. She has been receptive to steroid taper in the past.     Was on Evista for history of previous right breast cancer 30 years ago but was recently discontinued. Continues with annual mammograms.   She has hx of depression in remission off of medication for may years.    BMI is Body mass index is 21.87 kg/m., she has been working on diet, is active, walking 1 mile daily and working in her garden. Wt Readings from Last 3 Encounters:  12/01/22 127 lb 6.4 oz (57.8 kg)  10/18/22 132 lb 9.6 oz (60.1 kg)  10/15/22 129 lb (58.5 kg)   Her blood pressure has been controlled at home, today their BP is BP: 112/70 She does workout, she is on toprol for HA. She denies chest pain, shortness of breath, dizziness (was having positional, improved with increased fluid).   She is on cholesterol medication, lipitor 20 mg daily Her cholesterol is at goal. The cholesterol last visit was:   Lab Results  Component Value Date   CHOL 184 11/17/2021   HDL 47 (L)  11/17/2021   LDLCALC 116 (H) 11/17/2021   TRIG 104 11/17/2021   CHOLHDL 3.9 11/17/2021    She has been working on diet and exercise for prediabetes, and denies foot ulcerations, increased appetite, nausea, paresthesia of the feet, polydipsia, polyuria, visual disturbances, vomiting and weight loss. Last A1C in the office was:  Lab Results  Component Value Date   HGBA1C 6.0 (H) 11/17/2021   She has CKD 3a monitored at this office. Last GFR:  Lab Results  Component Value Date   GFRNONAA >60 10/15/2022   Patient is on Vitamin D supplement.   Lab Results  Component Value Date   VD25OH 72 11/17/2021        Medication Review Current Outpatient Medications on File Prior to Visit  Medication Sig Dispense Refill   Ascorbic Acid (VITAMIN C PO) Take 500 mg by mouth daily.     atorvastatin (LIPITOR) 40 MG tablet TAKE 1 TAB DAILY FOR CHOLESTEROL. 90 tablet 1   Cholecalciferol (VITAMIN D PO) Take 5,000 Units by mouth daily.      clopidogrel (PLAVIX) 75 MG tablet Take  1 tablet   Daily  to Prevent Blood Clots 90 tablet 4   cyanocobalamin 1000 MCG tablet Take 1 tablet (1,000 mcg total) by mouth daily. 30 tablet 5   hydroxyurea (HYDREA) 500 MG capsule Take 1 capsule by mouth once daily 90 capsule 0   metoprolol succinate (TOPROL-XL) 25  MG 24 hr tablet Take 1 tablet  Daily for BP                                                      /                            TAKE                                 BY                     MOUTH 90 tablet 3   Omega-3 Fatty Acids (FISH OIL PO) Take 1,000 mg by mouth daily.     Probiotic Product (PROBIOTIC PO) Take 250 mg by mouth daily.     doxycycline (VIBRAMYCIN) 100 MG capsule Take 1 capsule 2 x /day with meals for Infection (Patient not taking: Reported on 12/01/2022) 10 capsule 0   No current facility-administered medications on file prior to visit.    Current Problems (verified) Patient Active Problem List   Diagnosis Date Noted   Genetic testing 08/31/2021    Family history of breast cancer 08/12/2021   Family history of leukemia 08/12/2021   Global amnesia 07/24/2021   Aortic atherosclerosis (HCC) 08/28/2020   BMI 22.0-22.9, adult 06/13/2020   CKD (chronic kidney disease) stage 3, GFR 30-59 ml/min (HCC) 11/02/2019   Seasonal and perennial allergic rhinitis 08/31/2019   Ptosis of eyelid 09/12/2017   Osteopenia 12/23/2014   Medication management 10/24/2013   Hyperlipidemia    Hypertension    Vitamin D deficiency    Depression, major, in remission (HCC)    History of right breast cancer     Screening Tests Immunization History  Administered Date(s) Administered   DT (Pediatric) 12/23/2014   Influenza Split 03/28/2022   Influenza, High Dose Seasonal PF 03/31/2019   Influenza-Unspecified 04/02/2016, 03/16/2018, 04/27/2020, 05/27/2021   PFIZER(Purple Top)SARS-COV-2 Vaccination 07/13/2019, 08/08/2019, 04/17/2020   Pneumococcal Conjugate-13 12/17/2013, 01/20/2022   Pneumococcal Polysaccharide-23 10/23/2009   Td 04/15/2005   Zoster Recombinat (Shingrix) 01/20/2022, 04/28/2022   Zoster, Live 11/19/2010   Preventative care: Last colonoscopy: 05/2014  Dr. Oletha Blend, normal, 5 year follow up, hx of polyps but not precancerous Negative cologuard 10/2019 Due Last mammogram: 07/2022 Last pap smear/pelvic exam: 2009 remote DEXA: 06/2021 T Osteopenia T Score -1.6  Prior vaccinations: TD: 2016  Influenza: Due 03/2022 Pneumococcal: 2011 Prevnar 13: 2015 Shingles/Zostavax: 2012  Covid: 2/2, 2021, pfizer + booster  Names of Other Physician/Practitioners you currently use: 1. [facility]- here for primary care 2. Dr. Cleta Alberts, eye doctor, last visit seeing 2022, plans to schedule 3. Dr. Shelda Altes, dentist, last visit q 6 months 2023  Patient Care Team: Lucky Cowboy, MD as PCP - General (Internal Medicine) Hilarie Fredrickson, MD as Consulting Physician (Gastroenterology) Sundra Aland, [facility] as Pharmacist  (Pharmacist)   Allergies Allergies  Allergen Reactions   Codeine Nausea And Vomiting    "Feels like I have the flu"   SURGICAL HISTORY She  has a past surgical history that includes Tonsillectomy and adenoidectomy; Abdominal hysterectomy; Breast lumpectomy (Right); and Cataract extraction, bilateral (Bilateral, 2016). FAMILY HISTORY Her family history includes ALS in her brother;  Breast cancer in her paternal grandfather; Breast cancer (age of onset: 35) in her niece; Cancer in her maternal aunt; Heart attack in her brother and maternal grandfather; Heart attack (age of onset: 78) in her mother; Heart attack (age of onset: 51) in her father; Leukemia in her brother and paternal grandmother; Leukemia (age of onset: 6) in some other family members; Stomach cancer in her maternal grandmother. SOCIAL HISTORY She  reports that she has never smoked. She has never used smokeless tobacco. She reports that she does not drink alcohol and does not use drugs.  Objective:     Today's Vitals   12/01/22 1408  BP: 112/70  Pulse: 85  Resp: 16  Temp: 97.9 F (36.6 C)  SpO2: 98%  Weight: 127 lb 6.4 oz (57.8 kg)  Height: 5\' 4"  (1.626 m)    Body mass index is 21.87 kg/m.  General Appearance: Well nourished, in no apparent distress. Eyes: PERRLA, EOMs, conjunctiva no swelling or erythema Sinuses: No Frontal/maxillary tenderness ENT/Mouth: Ext aud canals with dry, erythematous flaky skin, TMs without erythema, bulging. No erythema, swelling, or exudate on post pharynx.  Tonsils not swollen or erythematous. Hearing normal.  Neck: Supple, thyroid normal.  Respiratory: Respiratory effort normal, BS equal bilaterally without rales, rhonchi, wheezing or stridor.  Cardio: RRR with no MRGs. Brisk peripheral pulses without edema.  Abdomen: Soft, + BS.  Non tender, no guarding, rebound, hernias, masses. Lymphatics: Non tender without lymphadenopathy.  Musculoskeletal: Full ROM, 5/5 strength, Normal  gait Skin: Scattered vesicle type rash with underlying erythema along BLE shin and BUE forearms.  Warm.  Appropriate color for ethnicity. Warm, dry without rashes, lesions, ecchymosis.  Neuro: Cranial nerves intact. No cerebellar symptoms.  Psych: Awake and oriented X 3, normal affect, Insight and Judgment appropriate.   EKG:  NSR  Benen Weida, NP   12/01/2022

## 2022-12-01 NOTE — Patient Instructions (Signed)
Preventing Poisoning, Adult Poisoning happens when you eat, drink, touch, or breathe in a harmful substance. Poisoning can be accidental or intentional. Most poisonings happen in the home and involve common household products. Poisoning causes health problems that can range from mild to life-threatening. The health effects of poisoning depend on: The type of poison. How long you were exposed to the poison. How much of the poison you were exposed to. What are some common household poisons? Many household products can cause poisoning. These products include: Medicines. Vitamins and minerals. Cleaning and laundry products. Beauty products, such as perfume, hair spray, and fingernail polish. Plants, such as philodendron, poinsettia, oleander, castor bean, cactus, and tomato plants. Paint. Batteries. Other products that can cause poisoning may include: Recreational drugs. Alcohol. Automotive products, such as antifreeze. Weed and insect killers. What actions can I take if I am exposed to a poison? If you are exposed to poison, contact your local poison control center right away. Call [phone] to reach the poison control center for your area. A poison control specialist will often give you directions to follow over the phone. Directions may include the following: If the poison is still in your mouth, remove it. If you ate or drank the poison, drink a small amount of water or milk. If the poison is a medicine, keep the medicine container. If the poison was from fumes, get away from the fumes. If the poison was inhaled, get fresh air as soon as possible. If the poison got on your clothes or skin, take off any clothing with the poison on it and rinse the skin with water. If the poison got in your eyes, rinse your eyes with water. What actions can I take to prevent poisoning?  Medicines Read labels before using medicines or household products. Leave the original labels on the  containers. When taking a medicine, always keep a light on so that you can clearly see the medicine. Check the medicine dosage every time. Get rid of unneeded and outdated medicines. To get rid of a medicine: Do not put the medicine in the trash or flush it down the toilet. Follow the disposal instructions that are on the medicine label or that came with the medicine. Use your community's drug take-back program to get rid of the medicine. If this option is not available, take the medicine out of the original container and mix it with something that you would not eat, like coffee grounds or kitty litter. Seal the mixture in a bag, can, or other container and throw it away. Storage Keep medicines and chemical products in their original containers. Close containers tightly after using medicine or chemical products. Keep all dangerous household products in locked cabinets. General information Educate others about the dangers of possible poisons. Do not mix different household chemicals with each other. Use protective equipment, such as goggles, masks, or gloves, as needed when using chemicals or cleaners. Install a carbon monoxide detector in your home. Keep the phone number for your local poison control center posted near your landline phone, or in your cell phone. Make sure everyone in your household knows where to find the number. Where to find more information American Association of Poison Control Centers: aapcc.org Contact a health care provider if: You have a widespread rash. You feel dizzy. You have changes in vision. You have severe abdominal pain. You have a headache that gets worse. Get help right away if: You have trouble breathing. You have chest pain. You have been told you  had a seizure. You have severe vomiting or bleeding. You have trouble swallowing. These symptoms may be an emergency. Get help right away. Call 911. Do not wait to see if the symptoms will go away. Do  not drive yourself to the hospital. Summary Poisoning happens when you eat, drink, touch, or breathe in a harmful substance. Poisoning causes health problems that can range from mild to life-threatening. Read labels before using medicines or household products. Leave the original labels on the containers for medicines or household products. If you are exposed to poison, contact your local poison control center right away. Call [phone] to reach the poison control center for your area. This information is not intended to replace advice given to you by your health care provider. Make sure you discuss any questions you have with your health care provider. Document Revised: 08/28/2021 Document Reviewed: 08/28/2021 Elsevier Patient Education  2024 [employer].

## 2022-12-02 LAB — CBC WITH DIFFERENTIAL/PLATELET
Absolute Monocytes: 633 cells/uL (ref 200–950)
Basophils Absolute: 23 cells/uL (ref 0–200)
Basophils Relative: 0.2 %
Eosinophils Absolute: 23 cells/uL (ref 15–500)
Eosinophils Relative: 0.2 %
HCT: 45.2 % — ABNORMAL HIGH (ref 35.0–45.0)
Hemoglobin: 15.5 g/dL (ref 11.7–15.5)
Lymphs Abs: 1208 cells/uL (ref 850–3900)
MCH: 34.2 pg — ABNORMAL HIGH (ref 27.0–33.0)
MCHC: 34.3 g/dL (ref 32.0–36.0)
MCV: 99.8 fL (ref 80.0–100.0)
MPV: 10.4 fL (ref 7.5–12.5)
Monocytes Relative: 5.5 %
Neutro Abs: 9614 cells/uL — ABNORMAL HIGH (ref 1500–7800)
Neutrophils Relative %: 83.6 %
Platelets: 480 10*3/uL — ABNORMAL HIGH (ref 140–400)
RBC: 4.53 10*6/uL (ref 3.80–5.10)
RDW: 12.8 % (ref 11.0–15.0)
Total Lymphocyte: 10.5 %
WBC: 11.5 10*3/uL — ABNORMAL HIGH (ref 3.8–10.8)

## 2022-12-02 LAB — LIPID PANEL
Cholesterol: 202 mg/dL — ABNORMAL HIGH (ref <200)
HDL: 60 mg/dL (ref >=50)
LDL Cholesterol (Calc): 117 mg/dL (calc) — ABNORMAL HIGH
Non-HDL Cholesterol (Calc): 142 mg/dL (calc) — ABNORMAL HIGH (ref <130)
Total CHOL/HDL Ratio: 3.4 (calc) (ref <5.0)
Triglycerides: 131 mg/dL (ref <150)

## 2022-12-02 LAB — URINALYSIS, ROUTINE W REFLEX MICROSCOPIC
Bacteria, UA: NONE SEEN /HPF
Bilirubin Urine: NEGATIVE
Glucose, UA: NEGATIVE
Hgb urine dipstick: NEGATIVE
Leukocytes,Ua: NEGATIVE
Nitrite: NEGATIVE
Specific Gravity, Urine: 1.024 (ref 1.001–1.035)
pH: 5.5 (ref 5.0–8.0)

## 2022-12-02 LAB — MAGNESIUM: Magnesium: 2.2 mg/dL (ref 1.5–2.5)

## 2022-12-02 LAB — COMPLETE METABOLIC PANEL WITH GFR
AG Ratio: 2.2 (calc) (ref 1.0–2.5)
ALT: 18 U/L (ref 6–29)
AST: 19 U/L (ref 10–35)
Albumin: 5 g/dL (ref 3.6–5.1)
Alkaline phosphatase (APISO): 114 U/L (ref 37–153)
BUN/Creatinine Ratio: 17 (calc) (ref 6–22)
BUN: 21 mg/dL (ref 7–25)
CO2: 26 mmol/L (ref 20–32)
Calcium: 10 mg/dL (ref 8.6–10.4)
Chloride: 103 mmol/L (ref 98–110)
Creat: 1.26 mg/dL — ABNORMAL HIGH (ref 0.60–0.95)
Globulin: 2.3 g/dL (calc) (ref 1.9–3.7)
Glucose, Bld: 103 mg/dL — ABNORMAL HIGH (ref 65–99)
Potassium: 4.9 mmol/L (ref 3.5–5.3)
Sodium: 140 mmol/L (ref 135–146)
Total Bilirubin: 1 mg/dL (ref 0.2–1.2)
Total Protein: 7.3 g/dL (ref 6.1–8.1)
eGFR: 42 mL/min/{1.73_m2} — ABNORMAL LOW (ref >=60)

## 2022-12-02 LAB — VITAMIN D 25 HYDROXY (VIT D DEFICIENCY, FRACTURES): Vit D, 25-Hydroxy: 46 ng/mL (ref 30–100)

## 2022-12-02 LAB — TSH: TSH: 0.95 mIU/L (ref 0.40–4.50)

## 2022-12-02 LAB — INSULIN, RANDOM: Insulin: 7.6 u[IU]/mL

## 2022-12-02 LAB — HEMOGLOBIN A1C
Hgb A1c MFr Bld: 6 % of total Hgb — ABNORMAL HIGH (ref <5.7)
Mean Plasma Glucose: 126 mg/dL
eAG (mmol/L): 7 mmol/L

## 2022-12-02 LAB — MICROALBUMIN / CREATININE URINE RATIO
Creatinine, Urine: 258 mg/dL (ref 20–275)
Microalb Creat Ratio: 22 mg/g creat (ref <30)
Microalb, Ur: 5.7 mg/dL

## 2022-12-02 LAB — VITAMIN B12: Vitamin B-12: 623 pg/mL (ref 200–1100)

## 2022-12-02 LAB — MICROSCOPIC MESSAGE

## 2022-12-20 DIAGNOSIS — Z1211 Encounter for screening for malignant neoplasm of colon: Secondary | ICD-10-CM | POA: Diagnosis not present

## 2022-12-23 LAB — COLOGUARD: COLOGUARD: NEGATIVE

## 2023-02-08 ENCOUNTER — Telehealth: Payer: Self-pay | Admitting: Oncology

## 2023-02-08 NOTE — Telephone Encounter (Signed)
Patient called to cancel appointment 02/11/23 because she is going out of town for 10 days patient reschedule for 03/01/23

## 2023-02-11 ENCOUNTER — Inpatient Hospital Stay: Payer: Medicare HMO

## 2023-02-18 ENCOUNTER — Ambulatory Visit: Payer: Medicare HMO | Admitting: Nurse Practitioner

## 2023-03-01 ENCOUNTER — Inpatient Hospital Stay: Payer: Medicare HMO | Attending: Internal Medicine

## 2023-03-04 ENCOUNTER — Other Ambulatory Visit: Payer: Self-pay | Admitting: Nurse Practitioner

## 2023-03-04 DIAGNOSIS — E782 Mixed hyperlipidemia: Secondary | ICD-10-CM

## 2023-03-07 ENCOUNTER — Other Ambulatory Visit: Payer: Self-pay

## 2023-03-07 DIAGNOSIS — E782 Mixed hyperlipidemia: Secondary | ICD-10-CM

## 2023-03-07 MED ORDER — ATORVASTATIN CALCIUM 40 MG PO TABS
ORAL_TABLET | ORAL | 1 refills | Status: DC
Start: 2023-03-07 — End: 2023-10-31

## 2023-03-14 ENCOUNTER — Ambulatory Visit (INDEPENDENT_AMBULATORY_CARE_PROVIDER_SITE_OTHER): Payer: Medicare HMO | Admitting: Nurse Practitioner

## 2023-03-14 ENCOUNTER — Encounter: Payer: Self-pay | Admitting: Nurse Practitioner

## 2023-03-14 VITALS — BP 130/68 | HR 62 | Temp 97.9°F | Resp 16 | Ht 64.0 in | Wt 127.4 lb

## 2023-03-14 DIAGNOSIS — F325 Major depressive disorder, single episode, in full remission: Secondary | ICD-10-CM

## 2023-03-14 DIAGNOSIS — R7309 Other abnormal glucose: Secondary | ICD-10-CM

## 2023-03-14 DIAGNOSIS — I700 Atherosclerosis of aorta: Secondary | ICD-10-CM

## 2023-03-14 DIAGNOSIS — N1831 Chronic kidney disease, stage 3a: Secondary | ICD-10-CM | POA: Diagnosis not present

## 2023-03-14 DIAGNOSIS — Z79899 Other long term (current) drug therapy: Secondary | ICD-10-CM | POA: Diagnosis not present

## 2023-03-14 DIAGNOSIS — M8589 Other specified disorders of bone density and structure, multiple sites: Secondary | ICD-10-CM

## 2023-03-14 DIAGNOSIS — Z0001 Encounter for general adult medical examination with abnormal findings: Secondary | ICD-10-CM

## 2023-03-14 DIAGNOSIS — Z803 Family history of malignant neoplasm of breast: Secondary | ICD-10-CM | POA: Diagnosis not present

## 2023-03-14 DIAGNOSIS — E782 Mixed hyperlipidemia: Secondary | ICD-10-CM | POA: Diagnosis not present

## 2023-03-14 DIAGNOSIS — Z6822 Body mass index (BMI) 22.0-22.9, adult: Secondary | ICD-10-CM

## 2023-03-14 DIAGNOSIS — I10 Essential (primary) hypertension: Secondary | ICD-10-CM | POA: Diagnosis not present

## 2023-03-14 DIAGNOSIS — Z0000 Encounter for general adult medical examination without abnormal findings: Secondary | ICD-10-CM

## 2023-03-14 DIAGNOSIS — R6889 Other general symptoms and signs: Secondary | ICD-10-CM | POA: Diagnosis not present

## 2023-03-14 DIAGNOSIS — Z23 Encounter for immunization: Secondary | ICD-10-CM

## 2023-03-14 DIAGNOSIS — E559 Vitamin D deficiency, unspecified: Secondary | ICD-10-CM

## 2023-03-14 NOTE — Assessment & Plan Note (Signed)
Discussed lifestyle modifications. Recommended diet heavy in fruits and veggies, omega 3's. Decrease consumption of animal meats, cheeses, and dairy products. Remain active and exercise as tolerated. Continue to monitor. Check lipids/TSH

## 2023-03-14 NOTE — Assessment & Plan Note (Signed)
Vitamin D deficiency Continue supplement for goal of 60-100 Monitor Vitamin D levels

## 2023-03-14 NOTE — Progress Notes (Signed)
MEDICARE ANNUAL WELLNESS VISIT AND 3 MONTH FOLLOW UP  Assessment:   Annual Wellness Due annually  Health maintenance reviewed Healthily lifestyle goals set  Vitamin D deficiency At goal at recent check;  Defer vitamin D level  Depression, remission (HCC) In remission off of medications  History of cancer of right breast Continue close follow ups, mammogram annually  Osteopenia Pursue a combination of weight-bearing exercises and strength training. Advised on fall prevention measures including proper lighting in all rooms, removal of area rugs and floor clutter, use of walking devices as deemed appropriate, avoidance of uneven walking surfaces. Smoking cessation and moderate alcohol consumption if applicable Consume 800 to 1000 IU of vitamin D daily with a goal vitamin D serum value of 30 ng/mL or higher. Aim for 1000 to 1200 mg of elemental calcium daily through supplements and/or dietary sources.   Seasonal allergies Continue OTC allergy pills  Other abnormal glucose Education: Reviewed 'ABCs' of diabetes management  Discussed goals to be met and/or maintained include A1C (<7) Blood pressure (<130/80) Cholesterol (LDL <70) Continue Eye Exam yearly  Continue Dental Exam Q6 mo Discussed dietary recommendations Discussed Physical Activity recommendations  BMI 22 Continue to recommend diet heavy in fruits and veggies and low in animal meats, cheeses, and dairy products, appropriate calorie intake Discuss exercise recommendations routinely Continue to monitor weight at each visit  CKD 3a (HCC) Discussed how what you eat and drink can aide in kidney protection. Stay well hydrated. Avoid high salt foods. Avoid NSAIDS. Keep BP and BG well controlled.   Take medications as prescribed. Remain active and exercise as tolerated daily. Maintain weight.  Continue to monitor.  Bowel habit changes/slow transit constipation Stay well hydrated. Suggest daily stool  softener. Remain active.   Abnormal glucose Education: Reviewed 'ABCs' of diabetes management  Discussed goals to be met and/or maintained include A1C (<7) Blood pressure (<130/80) Cholesterol (LDL <70) Continue Eye Exam yearly  Continue Dental Exam Q6 mo Discussed dietary recommendations Discussed Physical Activity recommendations Check A1C  Orders Placed This Encounter  Procedures   Flu vaccine HIGH DOSE PF (Fluzone High dose)   CBC with Differential/Platelet   COMPLETE METABOLIC PANEL WITH GFR   Lipid panel   Hemoglobin A1c    Notify office for further evaluation and treatment, questions or concerns if any reported s/s fail to improve.   The patient was advised to call back or seek an in-person evaluation if any symptoms worsen or if the condition fails to improve as anticipated.   Further disposition pending results of labs. Discussed med's effects and SE's.    I discussed the assessment and treatment plan with the patient. The patient was provided an opportunity to ask questions and all were answered. The patient agreed with the plan and demonstrated an understanding of the instructions.  Discussed med's effects and SE's. Screening labs and tests as requested with regular follow-up as recommended.  I provided 45 minutes of face-to-face time during this encounter including counseling, chart review, and critical decision making was preformed.  Today's Plan of Care is based on a patient-centered health care approach known as shared decision making - the decisions, tests and treatments allow for patient preferences and values to be balanced with clinical evidence.    Future Appointments  Date Time Provider Department Center  04/29/2023 11:00 AM [facility] PHLEBOTOMIST [facility] None  04/29/2023 11:20 AM Ladene Artist, MD [facility] None  12/01/2023  2:00 PM Adela Glimpse, NP [facility] None  03/13/2024 11:00 AM Adela Glimpse,  NP [facility] None    Plan:   During the  course of the visit the patient was educated and counseled about appropriate screening and preventive services including:   Pneumococcal vaccine  Influenza vaccine Td vaccine Screening electrocardiogram Screening mammography Bone densitometry screening Colorectal cancer screening Diabetes screening Glaucoma screening Nutrition counseling  Advanced directives: given information/requested  Subjective:   Tiffany Vaughn is a 82 y.o. female who presents for Medicare Annual Wellness Visit and follow up for HTN, chol, weight, vitamin D def.   In May 2020,  patient was hospitalized to r/o CVA and was diagnosed with TGA and Brain scans were negative.  Was on Evista for history of previous right breast cancer 30 years ago but was recently discontinued. Continues with annual mammograms.   Has increase in constipation.  Has noticed decrease in BM and increase in bloating, abd pain.  She takes Metamucil with some improvement.  Tries to stay hydrated.    She has hx of depression in remission off of medication for may years.    BMI is Body mass index is 21.87 kg/m., she has been working on diet, is active, walking 1 mile daily. Wt Readings from Last 3 Encounters:  03/14/23 127 lb 6.4 oz (57.8 kg)  12/01/22 127 lb 6.4 oz (57.8 kg)  10/18/22 132 lb 9.6 oz (60.1 kg)   Her blood pressure has been controlled at home, today their BP is BP: 130/68 She does workout, she is on toprol for HA. She denies chest pain, shortness of breath, dizziness (was having positional, improved with increased fluid).   She is on cholesterol medication, lipitor 20 mg daily Her cholesterol is at goal. The cholesterol last visit was:   Lab Results  Component Value Date   CHOL 202 (H) 12/01/2022   HDL 60 12/01/2022   LDLCALC 117 (H) 12/01/2022   TRIG 131 12/01/2022   CHOLHDL 3.4 12/01/2022    She has been working on diet and exercise for prediabetes, and denies foot ulcerations, increased appetite, nausea,  paresthesia of the feet, polydipsia, polyuria, visual disturbances, vomiting and weight loss. Last A1C in the office was:  Lab Results  Component Value Date   HGBA1C 6.0 (H) 12/01/2022   She has CKD 3a monitored at this office. Last GFR:  Lab Results  Component Value Date   GFRNONAA >60 10/15/2022   Patient is on Vitamin D supplement.   Lab Results  Component Value Date   VD25OH 46 12/01/2022        Medication Review Current Outpatient Medications on File Prior to Visit  Medication Sig Dispense Refill   Ascorbic Acid (VITAMIN C PO) Take 500 mg by mouth daily.     atorvastatin (LIPITOR) 40 MG tablet TAKE 1 TAB DAILY FOR CHOLESTEROL. 90 tablet 1   Cholecalciferol (VITAMIN D PO) Take 5,000 Units by mouth daily.      clopidogrel (PLAVIX) 75 MG tablet Take  1 tablet   Daily  to Prevent Blood Clots 90 tablet 4   cyanocobalamin 1000 MCG tablet Take 1 tablet (1,000 mcg total) by mouth daily. 30 tablet 5   hydroxyurea (HYDREA) 500 MG capsule Take 1 capsule by mouth once daily 90 capsule 0   metoprolol succinate (TOPROL-XL) 25 MG 24 hr tablet Take 1 tablet  Daily for BP                                                      /  TAKE                                 BY                     MOUTH 90 tablet 3   Omega-3 Fatty Acids (FISH OIL PO) Take 1,000 mg by mouth daily.     Probiotic Product (PROBIOTIC PO) Take 250 mg by mouth daily.     doxycycline (VIBRAMYCIN) 100 MG capsule Take 1 capsule 2 x /day with meals for Infection (Patient not taking: Reported on 12/01/2022) 10 capsule 0   predniSONE (DELTASONE) 10 MG tablet 1 tab 3 x day for 2 days, then 1 tab 2 x day for 2 days, then 1 tab 1 x day for 3 days (Patient not taking: Reported on 03/14/2023) 13 tablet 0   No current facility-administered medications on file prior to visit.    Current Problems (verified) Patient Active Problem List   Diagnosis Date Noted   Genetic testing 08/31/2021   Family history of breast  cancer 08/12/2021   Family history of leukemia 08/12/2021   Global amnesia 07/24/2021   Aortic atherosclerosis (HCC) 08/28/2020   BMI 22.0-22.9, adult 06/13/2020   CKD (chronic kidney disease) stage 3, GFR 30-59 ml/min (HCC) 11/02/2019   Seasonal and perennial allergic rhinitis 08/31/2019   Ptosis of eyelid 09/12/2017   Osteopenia 12/23/2014   Medication management 10/24/2013   Hyperlipidemia    Hypertension    Vitamin D deficiency    Depression, major, in remission (HCC)    History of right breast cancer     Screening Tests Immunization History  Administered Date(s) Administered   DT (Pediatric) 12/23/2014   Influenza Split 03/28/2022   Influenza, High Dose Seasonal PF 03/31/2019   Influenza-Unspecified 04/02/2016, 03/16/2018, 04/27/2020, 05/27/2021   PFIZER(Purple Top)SARS-COV-2 Vaccination 07/13/2019, 08/08/2019, 04/17/2020   Pneumococcal Conjugate-13 12/17/2013, 01/20/2022   Pneumococcal Polysaccharide-23 10/23/2009   Td 04/15/2005   Zoster Recombinant(Shingrix) 01/20/2022, 04/28/2022   Zoster, Live 11/19/2010   Preventative care: Last colonoscopy: 05/2014  Dr. Oletha Blend, normal, 5 year follow up - Cologuard 11/2022 Negative  Last mammogram: 07/2022 Last pap smear/pelvic exam: 2009 remote DEXA: 06/2021 - Osteopenia T Score -1.6  Prior vaccinations: TD: 2016  Influenza: Due 03/2022 Pneumococcal: 2011 Prevnar 13: 2015 Shingles/Zostavax: 2012  Covid: 2/2, 2021, pfizer + booster  Names of Other Physician/Practitioners you currently use: 1. [facility]- here for primary care 2. Dr. Cleta Alberts, eye doctor, last visit seeing 2020, looking for new provider  3. Dr. Shelda Altes, dentist, last visit q 6 months 2024  Patient Care Team: Lucky Cowboy, MD as PCP - General (Internal Medicine) Hilarie Fredrickson, MD as Consulting Physician (Gastroenterology) Sundra Aland, [facility] (Inactive) as Pharmacist (Pharmacist)   Allergies Allergies   Allergen Reactions   Codeine Nausea And Vomiting    "Feels like I have the flu"   SURGICAL HISTORY She  has a past surgical history that includes Tonsillectomy and adenoidectomy; Abdominal hysterectomy; Breast lumpectomy (Right); and Cataract extraction, bilateral (Bilateral, 2016). FAMILY HISTORY Her family history includes ALS in her brother; Breast cancer in her paternal grandfather; Breast cancer (age of onset: 32) in her niece; Cancer in her maternal aunt; Heart attack in her brother and maternal grandfather; Heart attack (age of onset: 62) in her mother; Heart attack (age of onset: 51) in her father; Leukemia in her brother and paternal grandmother;  Leukemia (age of onset: 18) in some other family members; Stomach cancer in her maternal grandmother. SOCIAL HISTORY She  reports that she has never smoked. She has never used smokeless tobacco. She reports that she does not drink alcohol and does not use drugs.  MEDICARE WELLNESS OBJECTIVES: Physical activity:  no regular exercise at this time Depression/mood screen:      03/14/2023   11:21 AM  Depression screen PHQ 2/9  Decreased Interest 0  Down, Depressed, Hopeless 0  PHQ - 2 Score 0   Hearing: normal Visual acuity: normal,  does perform annual eye exam  ADLs:     03/14/2023   11:21 AM  In your present state of health, do you have any difficulty performing the following activities:  Hearing? 0  Vision? 0  Difficulty concentrating or making decisions? 0  Walking or climbing stairs? 0  Dressing or bathing? 0  Doing errands, shopping? 0    Fall risk: Low Risk Cognitive Testing  Alert? Yes  Normal Appearance?Yes  Oriented to person? Yes  Place? Yes   Time? Yes  Recall of three objects?  Yes  Can perform simple calculations? Yes  Displays appropriate judgment?Yes  Can read the correct time from a watch face?Yes  EOL planning:      Objective:     Today's Vitals   03/14/23 1039  BP: 130/68  Pulse: 62  Resp: 16   Temp: 97.9 F (36.6 C)  SpO2: 98%  Weight: 127 lb 6.4 oz (57.8 kg)  Height: 5\' 4"  (1.626 m)    Body mass index is 21.87 kg/m.  General Appearance: Well nourished, in no apparent distress. Eyes: PERRLA, EOMs, conjunctiva no swelling or erythema Sinuses: No Frontal/maxillary tenderness ENT/Mouth: Ext aud canals with dry, erythematous flaky skin, TMs without erythema, bulging. No erythema, swelling, or exudate on post pharynx.  Tonsils not swollen or erythematous. Hearing normal.  Neck: Supple, thyroid normal.  Respiratory: Respiratory effort normal, BS equal bilaterally without rales, rhonchi, wheezing or stridor.  Cardio: RRR with no MRGs. Brisk peripheral pulses without edema.  Abdomen: Soft, + BS.  Non tender, no guarding, rebound, hernias, masses. Lymphatics: Non tender without lymphadenopathy.  Musculoskeletal: Full ROM, 5/5 strength, Normal gait Skin: Warm, dry without rashes, lesions, ecchymosis.  Neuro: Cranial nerves intact. No cerebellar symptoms.  Psych: Awake and oriented X 3, normal affect, Insight and Judgment appropriate.    Medicare Attestation I have personally reviewed: The patient's medical and social history Their use of alcohol, tobacco or illicit drugs Their current medications and supplements The patient's functional ability including ADLs,fall risks, home safety risks, cognitive, and hearing and visual impairment Diet and physical activities Evidence for depression or mood disorders  The patient's weight, height, BMI, and visual acuity have been recorded in the chart.  I have made referrals, counseling, and provided education to the patient based on review of the above and I have provided the patient with a written personalized care plan for preventive services.     Adela Glimpse, NP   03/14/2023

## 2023-03-14 NOTE — Patient Instructions (Signed)

## 2023-03-14 NOTE — Assessment & Plan Note (Signed)
Control BP and Lipids

## 2023-03-14 NOTE — Assessment & Plan Note (Signed)
Continue ACE, bASA, statin Discussed how what you eat and drink can aide in kidney protection. Stay well hydrated. Avoid high salt foods. Avoid NSAIDS. Keep BP and BG well controlled.   Take medications as prescribed. Remain active and exercise as tolerated daily. Maintain weight.  Continue to monitor. Check CMP/GFR/Microablumin

## 2023-03-14 NOTE — Assessment & Plan Note (Signed)
Discussed appropriate BMI Diet modification. Physical activity. Encouraged/praised to build confidence.

## 2023-03-14 NOTE — Assessment & Plan Note (Signed)
Pursue a combination of weight-bearing exercises and strength training. Patients with severe mobility impairment should be referred for physical therapy. Advised on fall prevention measures including proper lighting in all rooms, removal of area rugs and floor clutter, use of walking devices as deemed appropriate, avoidance of uneven walking surfaces. Smoking cessation and moderate alcohol consumption if applicable Consume 800 to 1000 IU of vitamin D daily with a goal vitamin D serum value of 30 ng/mL or higher. Aim for 1000 to 1200 mg of elemental calcium daily through supplements and/or dietary sources.

## 2023-03-14 NOTE — Assessment & Plan Note (Signed)
All medications discussed and reviewed in full. All questions and concerns regarding medications addressed.

## 2023-03-14 NOTE — Assessment & Plan Note (Signed)
Discussed DASH (Dietary Approaches to Stop Hypertension) DASH diet is lower in sodium than a typical American diet. Cut back on foods that are high in saturated fat, cholesterol, and trans fats. Eat more whole-grain foods, fish, poultry, and nuts Remain active and exercise as tolerated daily.  Monitor BP at home-Call if greater than 130/80.  Check CMP/CBC

## 2023-03-15 LAB — LIPID PANEL
Cholesterol: 204 mg/dL — ABNORMAL HIGH (ref <200)
HDL: 53 mg/dL (ref >=50)
LDL Cholesterol (Calc): 122 mg/dL — ABNORMAL HIGH
Non-HDL Cholesterol (Calc): 151 mg/dL — ABNORMAL HIGH (ref <130)
Total CHOL/HDL Ratio: 3.8 (calc) (ref <5.0)
Triglycerides: 170 mg/dL — ABNORMAL HIGH (ref <150)

## 2023-03-15 LAB — HEMOGLOBIN A1C
Hgb A1c MFr Bld: 6.1 %{Hb} — ABNORMAL HIGH (ref <5.7)
Mean Plasma Glucose: 128 mg/dL
eAG (mmol/L): 7.1 mmol/L

## 2023-03-15 LAB — CBC WITH DIFFERENTIAL/PLATELET
Absolute Monocytes: 726 {cells}/uL (ref 200–950)
Basophils Absolute: 30 {cells}/uL (ref 0–200)
Basophils Relative: 0.5 %
Eosinophils Absolute: 312 {cells}/uL (ref 15–500)
Eosinophils Relative: 5.2 %
HCT: 44.6 % (ref 35.0–45.0)
Hemoglobin: 15 g/dL (ref 11.7–15.5)
Lymphs Abs: 1572 {cells}/uL (ref 850–3900)
MCH: 33.3 pg — ABNORMAL HIGH (ref 27.0–33.0)
MCHC: 33.6 g/dL (ref 32.0–36.0)
MCV: 98.9 fL (ref 80.0–100.0)
MPV: 10.5 fL (ref 7.5–12.5)
Monocytes Relative: 12.1 %
Neutro Abs: 3360 cells/uL (ref 1500–7800)
Neutrophils Relative %: 56 %
Platelets: 474 10*3/uL — ABNORMAL HIGH (ref 140–400)
RBC: 4.51 10*6/uL (ref 3.80–5.10)
RDW: 12.9 % (ref 11.0–15.0)
Total Lymphocyte: 26.2 %
WBC: 6 10*3/uL (ref 3.8–10.8)

## 2023-03-15 LAB — COMPLETE METABOLIC PANEL WITH GFR
AG Ratio: 1.8 (calc) (ref 1.0–2.5)
ALT: 16 U/L (ref 6–29)
AST: 22 U/L (ref 10–35)
Albumin: 4.6 g/dL (ref 3.6–5.1)
Alkaline phosphatase (APISO): 107 U/L (ref 37–153)
BUN: 17 mg/dL (ref 7–25)
CO2: 28 mmol/L (ref 20–32)
Calcium: 9.9 mg/dL (ref 8.6–10.4)
Chloride: 105 mmol/L (ref 98–110)
Creat: 0.88 mg/dL (ref 0.60–0.95)
Globulin: 2.5 g/dL (ref 1.9–3.7)
Glucose, Bld: 102 mg/dL — ABNORMAL HIGH (ref 65–99)
Potassium: 5.2 mmol/L (ref 3.5–5.3)
Sodium: 141 mmol/L (ref 135–146)
Total Bilirubin: 0.9 mg/dL (ref 0.2–1.2)
Total Protein: 7.1 g/dL (ref 6.1–8.1)
eGFR: 65 mL/min/{1.73_m2} (ref >=60)

## 2023-03-18 ENCOUNTER — Other Ambulatory Visit: Payer: Self-pay | Admitting: Oncology

## 2023-03-18 ENCOUNTER — Other Ambulatory Visit: Payer: Self-pay | Admitting: Internal Medicine

## 2023-03-18 DIAGNOSIS — I10 Essential (primary) hypertension: Secondary | ICD-10-CM

## 2023-03-18 DIAGNOSIS — D75839 Thrombocytosis, unspecified: Secondary | ICD-10-CM

## 2023-03-26 ENCOUNTER — Other Ambulatory Visit: Payer: Self-pay | Admitting: Internal Medicine

## 2023-03-26 DIAGNOSIS — I10 Essential (primary) hypertension: Secondary | ICD-10-CM

## 2023-04-15 ENCOUNTER — Other Ambulatory Visit: Payer: Medicare HMO

## 2023-04-15 ENCOUNTER — Ambulatory Visit: Payer: Medicare HMO | Admitting: Oncology

## 2023-04-29 ENCOUNTER — Inpatient Hospital Stay: Payer: Medicare HMO

## 2023-04-29 ENCOUNTER — Inpatient Hospital Stay: Payer: Medicare HMO | Admitting: Oncology

## 2023-05-06 ENCOUNTER — Inpatient Hospital Stay: Payer: Medicare HMO | Attending: Oncology

## 2023-05-06 ENCOUNTER — Other Ambulatory Visit: Payer: Self-pay | Admitting: *Deleted

## 2023-05-06 ENCOUNTER — Inpatient Hospital Stay: Payer: Medicare HMO | Admitting: Oncology

## 2023-05-06 VITALS — BP 133/76 | HR 68 | Temp 98.1°F | Resp 20 | Ht 64.0 in | Wt 125.7 lb

## 2023-05-06 DIAGNOSIS — Z853 Personal history of malignant neoplasm of breast: Secondary | ICD-10-CM | POA: Insufficient documentation

## 2023-05-06 DIAGNOSIS — Z8673 Personal history of transient ischemic attack (TIA), and cerebral infarction without residual deficits: Secondary | ICD-10-CM | POA: Diagnosis not present

## 2023-05-06 DIAGNOSIS — D75839 Thrombocytosis, unspecified: Secondary | ICD-10-CM | POA: Diagnosis not present

## 2023-05-06 DIAGNOSIS — Z923 Personal history of irradiation: Secondary | ICD-10-CM | POA: Insufficient documentation

## 2023-05-06 DIAGNOSIS — I10 Essential (primary) hypertension: Secondary | ICD-10-CM | POA: Insufficient documentation

## 2023-05-06 DIAGNOSIS — Z7901 Long term (current) use of anticoagulants: Secondary | ICD-10-CM | POA: Diagnosis not present

## 2023-05-06 DIAGNOSIS — Z803 Family history of malignant neoplasm of breast: Secondary | ICD-10-CM | POA: Insufficient documentation

## 2023-05-06 DIAGNOSIS — D473 Essential (hemorrhagic) thrombocythemia: Secondary | ICD-10-CM | POA: Insufficient documentation

## 2023-05-06 DIAGNOSIS — E785 Hyperlipidemia, unspecified: Secondary | ICD-10-CM | POA: Diagnosis not present

## 2023-05-06 LAB — CBC WITH DIFFERENTIAL (CANCER CENTER ONLY)
Abs Immature Granulocytes: 0.03 10*3/uL (ref 0.00–0.07)
Basophils Absolute: 0 10*3/uL (ref 0.0–0.1)
Basophils Relative: 0 %
Eosinophils Absolute: 0.3 10*3/uL (ref 0.0–0.5)
Eosinophils Relative: 3 %
HCT: 44.9 % (ref 36.0–46.0)
Hemoglobin: 14.7 g/dL (ref 12.0–15.0)
Immature Granulocytes: 0 %
Lymphocytes Relative: 17 %
Lymphs Abs: 1.7 10*3/uL (ref 0.7–4.0)
MCH: 33.2 pg (ref 26.0–34.0)
MCHC: 32.7 g/dL (ref 30.0–36.0)
MCV: 101.4 fL — ABNORMAL HIGH (ref 80.0–100.0)
Monocytes Absolute: 0.8 10*3/uL (ref 0.1–1.0)
Monocytes Relative: 8 %
Neutro Abs: 7.3 10*3/uL (ref 1.7–7.7)
Neutrophils Relative %: 72 %
Platelet Count: 503 10*3/uL — ABNORMAL HIGH (ref 150–400)
RBC: 4.43 MIL/uL (ref 3.87–5.11)
RDW: 13.6 % (ref 11.5–15.5)
WBC Count: 10.1 10*3/uL (ref 4.0–10.5)
nRBC: 0 % (ref 0.0–0.2)

## 2023-05-06 LAB — CMP (CANCER CENTER ONLY)
ALT: 13 U/L (ref 0–44)
AST: 17 U/L (ref 15–41)
Albumin: 4.2 g/dL (ref 3.5–5.0)
Alkaline Phosphatase: 95 U/L (ref 38–126)
Anion gap: 7 (ref 5–15)
BUN: 21 mg/dL (ref 8–23)
CO2: 28 mmol/L (ref 22–32)
Calcium: 9.9 mg/dL (ref 8.9–10.3)
Chloride: 105 mmol/L (ref 98–111)
Creatinine: 1.1 mg/dL — ABNORMAL HIGH (ref 0.44–1.00)
GFR, Estimated: 50 mL/min — ABNORMAL LOW (ref >60)
Glucose, Bld: 96 mg/dL (ref 70–99)
Potassium: 4.2 mmol/L (ref 3.5–5.1)
Sodium: 140 mmol/L (ref 135–145)
Total Bilirubin: 0.8 mg/dL (ref <1.2)
Total Protein: 6.9 g/dL (ref 6.5–8.1)

## 2023-05-06 MED ORDER — HYDROXYUREA 500 MG PO CAPS: ORAL_CAPSULE | ORAL | 0 refills | Status: DC

## 2023-05-06 NOTE — Progress Notes (Signed)
  [facility] OFFICE PROGRESS NOTE   Diagnosis: Essential thrombocytosis  INTERVAL HISTORY:   Tiffany Vaughn returns as scheduled.  She feels well.  She reports the left foot is "cold "in the evening.  No pain.  No weakness.  No claudication.  She continues hydroxyurea.  No mouth sores or diarrhea.  No symptom of bleeding or thrombosis.  Objective:  Vital signs in last 24 hours:  Blood pressure 133/76, pulse 68, temperature 98.1 F (36.7 C), resp. rate 20, height 5\' 4"  (1.626 m), weight 125 lb 11.2 oz (57 kg), SpO2 96%.    HEENT: No thrush or ulcers Resp: Lungs clear bilaterally Cardio: Regular rate and rhythm GI: No hepatosplenomegaly Vascular: No leg edema, the left foot is unremarkable.  The PT and DP pulses are intact.  The foot is warm.  Lab Results:  Lab Results  Component Value Date   WBC 10.1 05/06/2023   HGB 14.7 05/06/2023   HCT 44.9 05/06/2023   MCV 101.4 (H) 05/06/2023   PLT 503 (H) 05/06/2023   NEUTROABS 7.3 05/06/2023    CMP  Lab Results  Component Value Date   NA 141 03/14/2023   K 5.2 03/14/2023   CL 105 03/14/2023   CO2 28 03/14/2023   GLUCOSE 102 (H) 03/14/2023   BUN 17 03/14/2023   CREATININE 0.88 03/14/2023   CALCIUM 9.9 03/14/2023   PROT 7.1 03/14/2023   ALBUMIN 4.5 10/15/2022   AST 22 03/14/2023   ALT 16 03/14/2023   ALKPHOS 104 10/15/2022   BILITOT 0.9 03/14/2023   GFRNONAA >60 10/15/2022   GFRAA 60 10/02/2020     Medications: I have reviewed the patient's current medications.   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 Hydroxyurea started 09/04/2021 Hydroxyurea increased to 500 mg Monday through Friday, 10/02/2021 Hydroxyurea continued 500 mg Monday through Friday, 10/28/2021 Hydroxyurea increased to 500 mg daily 11/25/2021 Hydroxyurea increased to 1000 mg Monday, Wednesday, and Friday, 500 mg other days 05/06/2023 Hemoglobin upper end of normal range Acute infarct left centrum semiovale  07/25/2021, currently on Plavix and aspirin for 3 weeks then Plavix alone B12 deficiency, on oral replacement  Remote history of right breast cancer, status post radiation, completed 5 years of tamoxifen History of transient global amnesia Hypertension Hyperlipidemia Multiple family members with malignancy     Disposition: [name] appears stable.  The platelet count is at the upper end of goal range.  I recommend increasing the hydroxyurea dose with a goal platelet count of closer to 400,000.  She will begin hydroxyurea at a dose of 1000 mg Monday, Wednesday, and Friday.  500 mg on other days.  She will return for lab visit in 6 weeks and an office visit in early February.  She will seek medical attention for pain or new symptoms at the left leg/foot.  Thornton Papas, MD  05/06/2023  10:43 AM

## 2023-05-10 ENCOUNTER — Ambulatory Visit (INDEPENDENT_AMBULATORY_CARE_PROVIDER_SITE_OTHER): Payer: Medicare HMO | Admitting: Nurse Practitioner

## 2023-05-10 ENCOUNTER — Encounter: Payer: Self-pay | Admitting: Nurse Practitioner

## 2023-05-10 VITALS — BP 132/84 | HR 67 | Temp 97.7°F | Ht 64.0 in | Wt 127.0 lb

## 2023-05-10 DIAGNOSIS — I10 Essential (primary) hypertension: Secondary | ICD-10-CM | POA: Diagnosis not present

## 2023-05-10 DIAGNOSIS — M62830 Muscle spasm of back: Secondary | ICD-10-CM

## 2023-05-10 DIAGNOSIS — K5900 Constipation, unspecified: Secondary | ICD-10-CM | POA: Diagnosis not present

## 2023-05-10 MED ORDER — CYCLOBENZAPRINE HCL 5 MG PO TABS
5.0000 mg | ORAL_TABLET | Freq: Three times a day (TID) | ORAL | 0 refills | Status: DC | PRN
Start: 2023-05-10 — End: 2023-08-12

## 2023-05-10 NOTE — Patient Instructions (Signed)
Muscle Cramps and Spasms Muscle cramps and spasms happen when muscles tighten on their own. They can be in any muscle. They happen most often in the muscles in the back of your lower leg (calf). Muscle cramps are painful. They are often stronger and last longer than muscle spasms. Muscle spasms may or may not be painful. In many cases, the cause of a muscle cramp or spasm is not known. But it may be from: Doing more work or exercise than your body is ready for. Using the muscles too much (overuse). Staying in one position for too long. Not preparing enough or having bad form when you play a sport or do an activity. Getting hurt. Other causes may include: Not enough water in your body (dehydration). Taking certain medicines. Not having enough salts and minerals in the body (electrolytes). Follow these instructions at home: Eating and drinking Drink enough fluid to keep your pee (urine) pale yellow. Eat a healthy diet to help your muscles work well. Your diet should include: Fruits and vegetables. Lean protein. Whole grains. Low-fat or nonfat dairy products. Managing pain and stiffness     Massage, stretch, and relax the muscle. Do this for a few minutes at a time. If told, put ice on the muscle. This may help if you are sore or have pain after a cramp or spasm. Put ice in a plastic bag. Place a towel between your skin and the bag. Leave the ice on for 20 minutes, 2-3 times a day. If told, put heat on tight or tense muscles. Do this as often as told by your doctor. Use the heat source that your doctor recommends, such as a moist heat pack or a heating pad. Place a towel between your skin and the heat source. Leave the heat on for 20-30 minutes. If your skin turns bright red, take off the ice or heat right away to prevent skin damage. The risk of damage is higher if you cannot feel pain, heat, or cold. Take hot showers or baths to help relax the muscles. General instructions If you  are having cramps often, avoid intense exercise for a few days. Take over-the-counter and prescription medicines only as told by your doctor. Watch for any changes in your symptoms. Contact a doctor if: Your cramps or spasms get worse or happen more often. Your cramps or spasms do not get better with time. This information is not intended to replace advice given to you by your health care provider. Make sure you discuss any questions you have with your health care provider. Document Revised: 02/02/2022 Document Reviewed: 02/02/2022 Elsevier Patient Education  2024 [employer].

## 2023-05-10 NOTE — Progress Notes (Signed)
Assessment and Plan:  Tiffany Vaughn was seen today for back pain.  Diagnoses and all orders for this visit:  Essential hypertension - continue medications- metoprolol 25 mg every day , DASH diet, exercise and monitor at home. Call if greater than 130/80.   Muscle spasm of back Rest, heat and muscle relaxant as needed If pain worsens or can not return to normal routine notify the office -     cyclobenzaprine (FLEXERIL) 5 MG tablet; Take 1 tablet (5 mg total) by mouth 3 (three) times daily as needed for muscle spasms.  Constipation, unspecified constipation type Continue smooth move tea and stool softener Monitor      Further disposition pending results of labs. Discussed med's effects and SE's.   Over 30 minutes of exam, counseling, chart review, and critical decision making was performed.   Future Appointments  Date Time Provider Department Center  06/16/2023  9:30 AM Adela Glimpse, NP [facility] None  06/17/2023 11:00 AM [facility] PHLEBOTOMIST [facility] None  08/12/2023 11:00 AM [facility] PHLEBOTOMIST [facility] None  08/12/2023 11:20 AM Ladene Artist, MD [facility] None  12/01/2023  2:00 PM Adela Glimpse, NP [facility] None  03/13/2024 11:00 AM Cranford, Archie Patten, NP [facility] None    ------------------------------------------------------------------------------------------------------------------   HPI BP 132/84   Pulse 67   Temp 97.7 F (36.5 C)   Ht 5\' 4"  (1.626 m)   Wt 127 lb (57.6 kg)   SpO2 95%   BMI 21.80 kg/m  81 y.o.female presents for complaint of left lower back pain that began 2 weeks ago after she lifted a bed. She was having left lower back spasm.  If she walks or does activity it will cause aching across entire lower back.  She is using smooth move tea and has noticed great improvement with her constipation.   BP well controlled with Toprol 25 mg every day- she takes at night   BP Readings from Last 3 Encounters:  05/10/23 132/84  05/06/23 133/76   03/14/23 130/68  Denies headaches, chest pain, shortness of breath and dizziness   BMI is Body mass index is 21.8 kg/m., she has been working on diet and exercise. Wt Readings from Last 3 Encounters:  05/10/23 127 lb (57.6 kg)  05/06/23 125 lb 11.2 oz (57 kg)  03/14/23 127 lb 6.4 oz (57.8 kg)    Past Medical History:  Diagnosis Date   Cancer of right breast (HCC) 1992   Hyperlipidemia    Hypertension    Postural hypotension 11/14/2018   Transient global amnesia 11/14/2018   Vitamin D deficiency      Allergies  Allergen Reactions   Codeine Nausea And Vomiting    "Feels like I have the flu"    Current Outpatient Medications on File Prior to Visit  Medication Sig   atorvastatin (LIPITOR) 40 MG tablet TAKE 1 TAB DAILY FOR CHOLESTEROL.   Cholecalciferol (VITAMIN D PO) Take 5,000 Units by mouth daily.    clopidogrel (PLAVIX) 75 MG tablet Take  1 tablet   Daily  to Prevent Blood Clots   cyanocobalamin 1000 MCG tablet Take 1 tablet (1,000 mcg total) by mouth daily.   hydroxyurea (HYDREA) 500 MG capsule Take #1 capsule (500 mg) daily, except #2 capsules (1000 mg) on Monday, Wednesday and Fridays   metoprolol succinate (TOPROL-XL) 25 MG 24 hr tablet Take 1 tablet Daily for BP                                            /  TAKE                                         BY                                                 MOUTH                      once ? ? ? daily   Omega-3 Fatty Acids (FISH OIL PO) Take 1,000 mg by mouth daily.   Probiotic Product (PROBIOTIC PO) Take 250 mg by mouth daily.   Ascorbic Acid (VITAMIN C PO) Take 500 mg by mouth daily. (Patient not taking: Reported on 05/06/2023)   No current facility-administered medications on file prior to visit.    ROS: all negative except above.   Physical Exam:  BP 132/84   Pulse 67   Temp 97.7 F (36.5 C)   Ht 5\' 4"  (1.626 m)   Wt 127 lb (57.6 kg)   SpO2 95%   BMI  21.80 kg/m   General Appearance: Well nourished, in no apparent distress. Eyes: PERRLA, EOMs, conjunctiva no swelling or erythema Sinuses: No Frontal/maxillary tenderness ENT/Mouth: Ext aud canals clear, TMs without erythema, bulging. No erythema, swelling, or exudate on post pharynx.  Hearing normal.  Neck: Supple, thyroid normal.  Respiratory: Respiratory effort normal, BS equal bilaterally without rales, rhonchi, wheezing or stridor.  Cardio: RRR with no MRGs. Brisk peripheral pulses without edema.  Abdomen: Soft, + BS.  Musculoskeletal: Full ROM, 5/5 strength, normal gait. Mild tenderness of left lower paraspinal muscle Skin: Warm, dry without rashes, lesions, ecchymosis.  Neuro: Cranial nerves intact. Normal muscle tone, no cerebellar symptoms. Sensation intact.  Psych: Awake and oriented X 3, normal affect, Insight and Judgment appropriate.     Raynelle Dick, NP 11:01 AM [name]

## 2023-05-23 ENCOUNTER — Other Ambulatory Visit: Payer: Self-pay | Admitting: Nurse Practitioner

## 2023-05-23 DIAGNOSIS — L239 Allergic contact dermatitis, unspecified cause: Secondary | ICD-10-CM

## 2023-06-03 DIAGNOSIS — R69 Illness, unspecified: Secondary | ICD-10-CM | POA: Diagnosis not present

## 2023-06-16 ENCOUNTER — Encounter: Payer: Self-pay | Admitting: Nurse Practitioner

## 2023-06-16 ENCOUNTER — Ambulatory Visit (INDEPENDENT_AMBULATORY_CARE_PROVIDER_SITE_OTHER): Payer: Medicare HMO | Admitting: Nurse Practitioner

## 2023-06-16 VITALS — BP 104/58 | HR 58 | Temp 97.8°F | Ht 64.0 in | Wt 127.6 lb

## 2023-06-16 DIAGNOSIS — J302 Other seasonal allergic rhinitis: Secondary | ICD-10-CM | POA: Diagnosis not present

## 2023-06-16 DIAGNOSIS — R7309 Other abnormal glucose: Secondary | ICD-10-CM | POA: Diagnosis not present

## 2023-06-16 DIAGNOSIS — Z853 Personal history of malignant neoplasm of breast: Secondary | ICD-10-CM | POA: Diagnosis not present

## 2023-06-16 DIAGNOSIS — I10 Essential (primary) hypertension: Secondary | ICD-10-CM

## 2023-06-16 DIAGNOSIS — F325 Major depressive disorder, single episode, in full remission: Secondary | ICD-10-CM

## 2023-06-16 DIAGNOSIS — E559 Vitamin D deficiency, unspecified: Secondary | ICD-10-CM

## 2023-06-16 DIAGNOSIS — Z6822 Body mass index (BMI) 22.0-22.9, adult: Secondary | ICD-10-CM

## 2023-06-16 DIAGNOSIS — N1831 Chronic kidney disease, stage 3a: Secondary | ICD-10-CM | POA: Diagnosis not present

## 2023-06-16 DIAGNOSIS — J3089 Other allergic rhinitis: Secondary | ICD-10-CM | POA: Diagnosis not present

## 2023-06-16 DIAGNOSIS — M8589 Other specified disorders of bone density and structure, multiple sites: Secondary | ICD-10-CM

## 2023-06-16 DIAGNOSIS — E782 Mixed hyperlipidemia: Secondary | ICD-10-CM | POA: Diagnosis not present

## 2023-06-16 DIAGNOSIS — Z79899 Other long term (current) drug therapy: Secondary | ICD-10-CM

## 2023-06-16 NOTE — Progress Notes (Signed)
3 MONTH FOLLOW UP  Assessment:   Essential Hypertension Discussed DASH (Dietary Approaches to Stop Hypertension) DASH diet is lower in sodium than a typical American diet. Cut back on foods that are high in saturated fat, cholesterol, and trans fats. Eat more whole-grain foods, fish, poultry, and nuts Remain active and exercise as tolerated daily.  Monitor BP at home-Call if greater than 130/80.  Check CMP/CBC  Mixed Hyperlipidemia Discussed lifestyle modifications. Recommended diet heavy in fruits and veggies, omega 3's. Decrease consumption of animal meats, cheeses, and dairy products. Remain active and exercise as tolerated. Continue to monitor. Check lipids/TSH  Vitamin D deficiency At goal at recent check;  Defer vitamin D level  Depression, remission (HCC) In remission off of medications  History of cancer of right breast Continue close follow ups, mammogram annually  Osteopenia Pursue a combination of weight-bearing exercises and strength training. Advised on fall prevention measures including proper lighting in all rooms, removal of area rugs and floor clutter, use of walking devices as deemed appropriate, avoidance of uneven walking surfaces. Smoking cessation and moderate alcohol consumption if applicable Consume 800 to 1000 IU of vitamin D daily with a goal vitamin D serum value of 30 ng/mL or higher. Aim for 1000 to 1200 mg of elemental calcium daily through supplements and/or dietary sources.   Seasonal allergies Continue OTC allergy pills  Other abnormal glucose Education: Reviewed 'ABCs' of diabetes management  Discussed goals to be met and/or maintained include A1C (<7) Blood pressure (<130/80) Cholesterol (LDL <70) Continue Eye Exam yearly  Continue Dental Exam Q6 mo Discussed dietary recommendations Discussed Physical Activity recommendations  BMI 22 Continue to recommend diet heavy in fruits and veggies and low in animal meats, cheeses, and  dairy products, appropriate calorie intake Discuss exercise recommendations routinely Continue to monitor weight at each visit  CKD 3a (HCC) Discussed how what you eat and drink can aide in kidney protection. Stay well hydrated. Avoid high salt foods. Avoid NSAIDS. Keep BP and BG well controlled.   Take medications as prescribed. Remain active and exercise as tolerated daily. Maintain weight.  Continue to monitor.  Abnormal glucose Education: Reviewed 'ABCs' of diabetes management  Discussed goals to be met and/or maintained include A1C (<7) Blood pressure (<130/80) Cholesterol (LDL <70) Continue Eye Exam yearly  Continue Dental Exam Q6 mo Discussed dietary recommendations Discussed Physical Activity recommendations Check A1C  Orders Placed This Encounter  Procedures   Lipid panel   Hemoglobin A1c    Notify office for further evaluation and treatment, questions or concerns if any reported s/s fail to improve.   The patient was advised to call back or seek an in-person evaluation if any symptoms worsen or if the condition fails to improve as anticipated.   Further disposition pending results of labs. Discussed med's effects and SE's.    I discussed the assessment and treatment plan with the patient. The patient was provided an opportunity to ask questions and all were answered. The patient agreed with the plan and demonstrated an understanding of the instructions.  Discussed med's effects and SE's. Screening labs and tests as requested with regular follow-up as recommended.  I provided 30 minutes of face-to-face time during this encounter including counseling, chart review, and critical decision making was preformed.  Today's Plan of Care is based on a patient-centered health care approach known as shared decision making - the decisions, tests and treatments allow for patient preferences and values to be balanced with clinical evidence.    Future  Appointments  Date Time  Provider Department Center  06/17/2023 11:00 AM [facility] PHLEBOTOMIST [facility] None  08/12/2023 11:00 AM [facility] PHLEBOTOMIST [facility] None  08/12/2023 11:20 AM Ladene Artist, MD [facility] None  12/01/2023  2:00 PM Adela Glimpse, NP [facility] None  03/13/2024 11:00 AM Adela Glimpse, NP [facility] None     Subjective:   Tiffany Vaughn is a 82 y.o. female who presents for a follow up for HTN, chol, weight, vitamin D def.   Overall she reports feeling well today - she has no new concerns at this time.  In May 2020,  patient was hospitalized to r/o CVA and was diagnosed with TGA and Brain scans were negative.  Was on Evista for history of previous right breast cancer 30 years ago but was recently discontinued. Continues with annual mammograms.   She has hx of depression in remission off of medication for may years.    BMI is Body mass index is 21.9 kg/m., she has been working on diet, is active, walking 1 mile daily. Wt Readings from Last 3 Encounters:  06/16/23 127 lb 9.6 oz (57.9 kg)  05/10/23 127 lb (57.6 kg)  05/06/23 125 lb 11.2 oz (57 kg)   Her blood pressure has been controlled at home, today their BP is BP: (!) 104/58 She does workout, she is on toprol for HA. She denies chest pain, shortness of breath, dizziness (was having positional, improved with increased fluid).   She is on cholesterol medication, lipitor 20 mg daily Her cholesterol is at goal. The cholesterol last visit was:   Lab Results  Component Value Date   CHOL 204 (H) 03/14/2023   HDL 53 03/14/2023   LDLCALC 122 (H) 03/14/2023   TRIG 170 (H) 03/14/2023   CHOLHDL 3.8 03/14/2023    She has been working on diet and exercise for prediabetes, and denies foot ulcerations, increased appetite, nausea, paresthesia of the feet, polydipsia, polyuria, visual disturbances, vomiting and weight loss. Last A1C in the office was:  Lab Results  Component Value Date   HGBA1C 6.1 (H) 03/14/2023   She has CKD 3a  monitored at this office. Last GFR:  Lab Results  Component Value Date   GFRNONAA 50 (L) 05/06/2023   Patient is on Vitamin D supplement.   Lab Results  Component Value Date   VD25OH 46 12/01/2022        Medication Review Current Outpatient Medications on File Prior to Visit  Medication Sig Dispense Refill   atorvastatin (LIPITOR) 40 MG tablet TAKE 1 TAB DAILY FOR CHOLESTEROL. 90 tablet 1   Cholecalciferol (VITAMIN D PO) Take 5,000 Units by mouth daily.      clopidogrel (PLAVIX) 75 MG tablet Take  1 tablet   Daily  to Prevent Blood Clots 90 tablet 4   cyanocobalamin 1000 MCG tablet Take 1 tablet (1,000 mcg total) by mouth daily. 30 tablet 5   cyclobenzaprine (FLEXERIL) 5 MG tablet Take 1 tablet (5 mg total) by mouth 3 (three) times daily as needed for muscle spasms. 60 tablet 0   hydroxyurea (HYDREA) 500 MG capsule Take #1 capsule (500 mg) daily, except #2 capsules (1000 mg) on Monday, Wednesday and Fridays 150 capsule 0   metoprolol succinate (TOPROL-XL) 25 MG 24 hr tablet Take 1 tablet Daily for BP                                            /  TAKE                                         BY                                                 MOUTH                      once ? ? ? daily 90 tablet 3   Omega-3 Fatty Acids (FISH OIL PO) Take 1,000 mg by mouth daily.     Probiotic Product (PROBIOTIC PO) Take 250 mg by mouth daily.     Ascorbic Acid (VITAMIN C PO) Take 500 mg by mouth daily. (Patient not taking: Reported on 05/06/2023)     No current facility-administered medications on file prior to visit.    Current Problems (verified) Patient Active Problem List   Diagnosis Date Noted   Genetic testing 08/31/2021   Family history of breast cancer 08/12/2021   Family history of leukemia 08/12/2021   Global amnesia 07/24/2021   Aortic atherosclerosis (HCC) 08/28/2020   BMI 22.0-22.9, adult 06/13/2020   CKD (chronic kidney  disease) stage 3, GFR 30-59 ml/min (HCC) 11/02/2019   Seasonal and perennial allergic rhinitis 08/31/2019   Ptosis of eyelid 09/12/2017   Osteopenia 12/23/2014   Medication management 10/24/2013   Hyperlipidemia    Hypertension    Vitamin D deficiency    Depression, major, in remission (HCC)    History of right breast cancer     Screening Tests Immunization History  Administered Date(s) Administered   DT (Pediatric) 12/23/2014   Influenza Split 03/28/2022   Influenza, High Dose Seasonal PF 03/31/2019, 03/14/2023   Influenza-Unspecified 04/02/2016, 03/16/2018, 04/27/2020, 05/27/2021   PFIZER(Purple Top)SARS-COV-2 Vaccination 07/13/2019, 08/08/2019, 04/17/2020   Pneumococcal Conjugate-13 12/17/2013, 01/20/2022   Pneumococcal Polysaccharide-23 10/23/2009   Td 04/15/2005   Zoster Recombinant(Shingrix) 01/20/2022, 04/28/2022   Zoster, Live 11/19/2010    Patient Care Team: Lucky Cowboy, MD as PCP - General (Internal Medicine) Hilarie Fredrickson, MD as Consulting Physician (Gastroenterology) Sundra Aland, [facility] (Inactive) as Pharmacist (Pharmacist)   Allergies Allergies  Allergen Reactions   Codeine Nausea And Vomiting    "Feels like I have the flu"   SURGICAL HISTORY She  has a past surgical history that includes Tonsillectomy and adenoidectomy; Abdominal hysterectomy; Breast lumpectomy (Right); and Cataract extraction, bilateral (Bilateral, 2016). FAMILY HISTORY Her family history includes ALS in her brother; Breast cancer in her paternal grandfather; Breast cancer (age of onset: 46) in her niece; Cancer in her maternal aunt; Heart attack in her brother and maternal grandfather; Heart attack (age of onset: 51) in her mother; Heart attack (age of onset: 64) in her father; Leukemia in her brother and paternal grandmother; Leukemia (age of onset: 95) in some other family members; Stomach cancer in her maternal grandmother. SOCIAL HISTORY She  reports that she has never smoked. She  has never used smokeless tobacco. She reports that she does not drink alcohol and does not use drugs.  Objective:     Today's Vitals   06/16/23 0931  BP: (!) 104/58  Pulse: (!) 58  Temp: 97.8 F (36.6 C)  SpO2: 98%  Weight: 127 lb 9.6 oz (57.9 kg)  Height:  5\' 4"  (1.626 m)     Body mass index is 21.9 kg/m.  General Appearance: Well nourished, in no apparent distress. Eyes: PERRLA, EOMs, conjunctiva no swelling or erythema Sinuses: No Frontal/maxillary tenderness ENT/Mouth: Ext aud canals with dry, erythematous flaky skin, TMs without erythema, bulging. No erythema, swelling, or exudate on post pharynx.  Tonsils not swollen or erythematous. Hearing normal.  Neck: Supple, thyroid normal.  Respiratory: Respiratory effort normal, BS equal bilaterally without rales, rhonchi, wheezing or stridor.  Cardio: RRR with no MRGs. Brisk peripheral pulses without edema.  Abdomen: Soft, + BS.  Non tender, no guarding, rebound, hernias, masses. Lymphatics: Non tender without lymphadenopathy.  Musculoskeletal: Full ROM, 5/5 strength, Normal gait Skin: Warm, dry without rashes, lesions, ecchymosis.  Neuro: Cranial nerves intact. No cerebellar symptoms.  Psych: Awake and oriented X 3, normal affect, Insight and Judgment appropriate.    Adela Glimpse, NP   06/16/2023

## 2023-06-16 NOTE — Patient Instructions (Signed)

## 2023-06-17 ENCOUNTER — Inpatient Hospital Stay: Payer: Medicare HMO | Attending: Oncology

## 2023-06-17 ENCOUNTER — Other Ambulatory Visit: Payer: Medicare HMO

## 2023-06-17 ENCOUNTER — Telehealth: Payer: Self-pay | Admitting: *Deleted

## 2023-06-17 DIAGNOSIS — D473 Essential (hemorrhagic) thrombocythemia: Secondary | ICD-10-CM | POA: Diagnosis present

## 2023-06-17 DIAGNOSIS — D75839 Thrombocytosis, unspecified: Secondary | ICD-10-CM

## 2023-06-17 LAB — HEMOGLOBIN A1C
Hgb A1c MFr Bld: 6.2 %{Hb} — ABNORMAL HIGH (ref <5.7)
Mean Plasma Glucose: 131 mg/dL
eAG (mmol/L): 7.3 mmol/L

## 2023-06-17 LAB — CBC WITH DIFFERENTIAL (CANCER CENTER ONLY)
Abs Immature Granulocytes: 0.02 10*3/uL (ref 0.00–0.07)
Basophils Absolute: 0 10*3/uL (ref 0.0–0.1)
Basophils Relative: 0 %
Eosinophils Absolute: 0.1 10*3/uL (ref 0.0–0.5)
Eosinophils Relative: 2 %
HCT: 41.8 % (ref 36.0–46.0)
Hemoglobin: 13.8 g/dL (ref 12.0–15.0)
Immature Granulocytes: 0 %
Lymphocytes Relative: 36 %
Lymphs Abs: 2.3 10*3/uL (ref 0.7–4.0)
MCH: 33.8 pg (ref 26.0–34.0)
MCHC: 33 g/dL (ref 30.0–36.0)
MCV: 102.5 fL — ABNORMAL HIGH (ref 80.0–100.0)
Monocytes Absolute: 0.6 10*3/uL (ref 0.1–1.0)
Monocytes Relative: 9 %
Neutro Abs: 3.4 10*3/uL (ref 1.7–7.7)
Neutrophils Relative %: 53 %
Platelet Count: 325 10*3/uL (ref 150–400)
RBC: 4.08 MIL/uL (ref 3.87–5.11)
RDW: 15.2 % (ref 11.5–15.5)
WBC Count: 6.5 10*3/uL (ref 4.0–10.5)
nRBC: 0 % (ref 0.0–0.2)

## 2023-06-17 LAB — LIPID PANEL
Cholesterol: 175 mg/dL (ref <200)
HDL: 51 mg/dL (ref >=50)
LDL Cholesterol (Calc): 104 mg/dL — ABNORMAL HIGH
Non-HDL Cholesterol (Calc): 124 mg/dL (ref <130)
Total CHOL/HDL Ratio: 3.4 (calc) (ref <5.0)
Triglycerides: 105 mg/dL (ref <150)

## 2023-06-17 NOTE — Telephone Encounter (Signed)
Per Dr.Sherrill, called pt with message below. Pt verbalized understanding.

## 2023-06-17 NOTE — Telephone Encounter (Signed)
-----   Message from Thornton Papas sent at 06/17/2023 11:30 AM EST ----- Please call patient, the platelet count has improved, continue hydroxyurea at the current dose, repeat CBC in 4 weeks

## 2023-08-09 LAB — HM MAMMOGRAPHY

## 2023-08-12 ENCOUNTER — Other Ambulatory Visit: Payer: Medicare HMO

## 2023-08-12 ENCOUNTER — Inpatient Hospital Stay: Payer: HMO | Attending: Oncology | Admitting: Oncology

## 2023-08-12 ENCOUNTER — Inpatient Hospital Stay: Payer: Self-pay

## 2023-08-12 VITALS — BP 124/86 | HR 70 | Temp 98.1°F | Resp 18 | Ht 64.0 in | Wt 126.3 lb

## 2023-08-12 DIAGNOSIS — Z853 Personal history of malignant neoplasm of breast: Secondary | ICD-10-CM | POA: Diagnosis not present

## 2023-08-12 DIAGNOSIS — E785 Hyperlipidemia, unspecified: Secondary | ICD-10-CM | POA: Diagnosis not present

## 2023-08-12 DIAGNOSIS — Z923 Personal history of irradiation: Secondary | ICD-10-CM | POA: Diagnosis not present

## 2023-08-12 DIAGNOSIS — D75839 Thrombocytosis, unspecified: Secondary | ICD-10-CM | POA: Diagnosis not present

## 2023-08-12 DIAGNOSIS — E538 Deficiency of other specified B group vitamins: Secondary | ICD-10-CM | POA: Diagnosis not present

## 2023-08-12 DIAGNOSIS — Z7902 Long term (current) use of antithrombotics/antiplatelets: Secondary | ICD-10-CM | POA: Insufficient documentation

## 2023-08-12 DIAGNOSIS — I10 Essential (primary) hypertension: Secondary | ICD-10-CM | POA: Insufficient documentation

## 2023-08-12 DIAGNOSIS — Z7982 Long term (current) use of aspirin: Secondary | ICD-10-CM | POA: Insufficient documentation

## 2023-08-12 DIAGNOSIS — D473 Essential (hemorrhagic) thrombocythemia: Secondary | ICD-10-CM | POA: Diagnosis present

## 2023-08-12 LAB — CBC WITH DIFFERENTIAL (CANCER CENTER ONLY)
Abs Immature Granulocytes: 0.02 10*3/uL (ref 0.00–0.07)
Basophils Absolute: 0 10*3/uL (ref 0.0–0.1)
Basophils Relative: 0 %
Eosinophils Absolute: 0.1 10*3/uL (ref 0.0–0.5)
Eosinophils Relative: 2 %
HCT: 42.2 % (ref 36.0–46.0)
Hemoglobin: 13.8 g/dL (ref 12.0–15.0)
Immature Granulocytes: 0 %
Lymphocytes Relative: 27 %
Lymphs Abs: 1.4 10*3/uL (ref 0.7–4.0)
MCH: 34.8 pg — ABNORMAL HIGH (ref 26.0–34.0)
MCHC: 32.7 g/dL (ref 30.0–36.0)
MCV: 106.3 fL — ABNORMAL HIGH (ref 80.0–100.0)
Monocytes Absolute: 0.5 10*3/uL (ref 0.1–1.0)
Monocytes Relative: 10 %
Neutro Abs: 3.2 10*3/uL (ref 1.7–7.7)
Neutrophils Relative %: 61 %
Platelet Count: 411 10*3/uL — ABNORMAL HIGH (ref 150–400)
RBC: 3.97 MIL/uL (ref 3.87–5.11)
RDW: 15.7 % — ABNORMAL HIGH (ref 11.5–15.5)
WBC Count: 5.2 10*3/uL (ref 4.0–10.5)
nRBC: 0 % (ref 0.0–0.2)

## 2023-08-12 NOTE — Progress Notes (Signed)
  [facility] OFFICE PROGRESS NOTE   Diagnosis: Essential thrombocytosis  INTERVAL HISTORY:   Tiffany Vaughn returns as scheduled.  She continues hydroxyurea.  No mouth sores, diarrhea, rash, bleeding, or symptom of thrombosis.  She reports mild dyspnea for the past few days.  She recently had a mild "cold ".  Objective:  Vital signs in last 24 hours:  Blood pressure 124/86, pulse 70, temperature 98.1 F (36.7 C), temperature source Temporal, resp. rate 18, height 5\' 4"  (1.626 m), weight 126 lb 4.8 oz (57.3 kg), SpO2 98%.    HEENT: No thrush or ulcers Resp: Lungs clear bilaterally Cardio: Regular rate and rhythm GI: No hepatosplenomegaly Vascular: No leg edema   Lab Results:  Lab Results  Component Value Date   WBC 5.2 08/12/2023   HGB 13.8 08/12/2023   HCT 42.2 08/12/2023   MCV 106.3 (H) 08/12/2023   PLT 411 (H) 08/12/2023   NEUTROABS 3.2 08/12/2023    CMP  Lab Results  Component Value Date   NA 140 05/06/2023   K 4.2 05/06/2023   CL 105 05/06/2023   CO2 28 05/06/2023   GLUCOSE 96 05/06/2023   BUN 21 05/06/2023   CREATININE 1.10 (H) 05/06/2023   CALCIUM 9.9 05/06/2023   PROT 6.9 05/06/2023   ALBUMIN 4.2 05/06/2023   AST 17 05/06/2023   ALT 13 05/06/2023   ALKPHOS 95 05/06/2023   BILITOT 0.8 05/06/2023   GFRNONAA 50 (L) 05/06/2023   GFRAA 60 10/02/2020     Medications: I have reviewed the patient's current medications.   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 Hydroxyurea started 09/04/2021 Hydroxyurea increased to 500 mg Monday through Friday, 10/02/2021 Hydroxyurea continued 500 mg Monday through Friday, 10/28/2021 Hydroxyurea increased to 500 mg daily 11/25/2021 Hydroxyurea increased to 1000 mg Monday, Wednesday, and Friday, 500 mg other days 05/06/2023 Hemoglobin upper end of normal range Acute infarct left centrum semiovale 07/25/2021, currently on Plavix and aspirin for 3 weeks then Plavix alone B12  deficiency, on oral replacement  Remote history of right breast cancer, status post radiation, completed 5 years of tamoxifen History of transient global amnesia Hypertension Hyperlipidemia Multiple family members with malignancy    Disposition: Tiffany Vaughn has essential thrombocytosis.  She continues hydroxyurea therapy.  The platelet count is at the upper end of the goal range.  She will continue hydroxyurea at the current dose.  She will return for a CBC in 3 months and an office visit in 6 months.  Thornton Papas, MD  08/12/2023  12:02 PM

## 2023-09-16 ENCOUNTER — Ambulatory Visit: Payer: Medicare HMO | Admitting: Nurse Practitioner

## 2023-10-31 ENCOUNTER — Ambulatory Visit (INDEPENDENT_AMBULATORY_CARE_PROVIDER_SITE_OTHER): Admitting: Family Medicine

## 2023-10-31 ENCOUNTER — Encounter (HOSPITAL_BASED_OUTPATIENT_CLINIC_OR_DEPARTMENT_OTHER): Payer: Self-pay | Admitting: Family Medicine

## 2023-10-31 ENCOUNTER — Other Ambulatory Visit: Payer: Self-pay | Admitting: *Deleted

## 2023-10-31 VITALS — BP 136/73 | HR 64 | Ht 64.0 in | Wt 124.5 lb

## 2023-10-31 DIAGNOSIS — N1831 Chronic kidney disease, stage 3a: Secondary | ICD-10-CM | POA: Diagnosis not present

## 2023-10-31 DIAGNOSIS — E559 Vitamin D deficiency, unspecified: Secondary | ICD-10-CM | POA: Diagnosis not present

## 2023-10-31 DIAGNOSIS — E782 Mixed hyperlipidemia: Secondary | ICD-10-CM

## 2023-10-31 DIAGNOSIS — I10 Essential (primary) hypertension: Secondary | ICD-10-CM | POA: Diagnosis not present

## 2023-10-31 DIAGNOSIS — D75839 Thrombocytosis, unspecified: Secondary | ICD-10-CM

## 2023-10-31 MED ORDER — CLOPIDOGREL BISULFATE 75 MG PO TABS: 75.0000 mg | ORAL_TABLET | Freq: Every day | ORAL | 3 refills | Status: AC

## 2023-10-31 MED ORDER — HYDROXYUREA 500 MG PO CAPS: ORAL_CAPSULE | ORAL | 0 refills | Status: DC

## 2023-10-31 MED ORDER — ATORVASTATIN CALCIUM 40 MG PO TABS: ORAL_TABLET | ORAL | 3 refills | Status: AC

## 2023-10-31 MED ORDER — METOPROLOL SUCCINATE ER 25 MG PO TB24: 25.0000 mg | ORAL_TABLET | Freq: Every day | ORAL | 3 refills | Status: AC

## 2023-10-31 NOTE — Assessment & Plan Note (Signed)
 Can continue with current supplementation.  Will plan to monitor vitamin D  level at future visits

## 2023-10-31 NOTE — Assessment & Plan Note (Signed)
 Blood pressure at goal, no concerns today, can continue with current medication.  Recommend intermittent monitoring of blood pressure at home, DASH diet

## 2023-10-31 NOTE — Telephone Encounter (Signed)
 LVM requesting refill on hydroxyurea  and states she is out of medication.

## 2023-10-31 NOTE — Assessment & Plan Note (Signed)
 Can monitor with intermittent labs moving forward.  Will need to be mindful of this in conjunction with managing other medical conditions.  It will be important to ensure optimal blood pressure management.

## 2023-10-31 NOTE — Progress Notes (Signed)
 New Patient Office Visit  Subjective   Patient ID: Tiffany Vaughn, female    DOB: Jul 27, 1940  Age: 83 y.o. MRN: [id number]  CC:  Chief Complaint  Patient presents with   New Patient (Initial Visit)    New patient - dr Mylinda Asa patient just establishing care     HPI Tiffany Vaughn presents to establish care Last PCP - Dr. Cassondra Cliff  Seeing Dr. Scherrie Curt related to thrombocytosis. On hydroxyurea  therapy. No current concerns.  Issue with transient global amnesia - has had prior evaluation with neurology, last visit February 2024 with recommendation for follow-up as needed.  As result of this, she is maintained on Plavix , denies any issues with medication, requesting refill today. Hyperlipidemia: She does take atorvastatin  daily without any reported side effects.  Requesting refill today.  Reports that she was started on metoprolol  in the past primarily related to headaches/migraines as well as potential concern for blood pressure issues.  Since being on medication, she has not had any further issues with headaches.  This has been for more than a decade.  Does take vitamin D  supplement over-the-counter.  No current concerns at this time.  Patient is originally from [place] , has been here for 36 years.  Outpatient Encounter Medications as of 10/31/2023  Medication Sig   Cholecalciferol  (VITAMIN D  PO) Take 5,000 Units by mouth daily.    cyanocobalamin  1000 MCG tablet Take 1 tablet (1,000 mcg total) by mouth daily.   hydroxyurea  (HYDREA ) 500 MG capsule Take #1 capsule (500 mg) daily, except #2 capsules (1000 mg) on Monday, Wednesday and Fridays   [DISCONTINUED] atorvastatin  (LIPITOR ) 40 MG tablet TAKE 1 TAB DAILY FOR CHOLESTEROL.   [DISCONTINUED] clopidogrel  (PLAVIX ) 75 MG tablet Take  1 tablet   Daily  to Prevent Blood Clots   [DISCONTINUED] metoprolol  succinate (TOPROL -XL) 25 MG 24 hr tablet Take 1 tablet Daily for BP                                            /                                                                    TAKE                                         BY                                                 MOUTH                      once ? ? ? daily   atorvastatin  (LIPITOR ) 40 MG tablet TAKE 1 TAB DAILY FOR CHOLESTEROL.   clopidogrel  (PLAVIX ) 75 MG tablet Take 1 tablet (75 mg total) by mouth daily.   metoprolol  succinate (TOPROL -XL) 25 MG 24 hr tablet Take 1 tablet (25 mg total) by mouth daily.  Omega-3 Fatty Acids (FISH OIL PO) Take 1,000 mg by mouth daily. (Patient not taking: Reported on 08/12/2023)   Probiotic Product (PROBIOTIC PO) Take 250 mg by mouth daily. (Patient not taking: Reported on 10/31/2023)   No facility-administered encounter medications on file as of 10/31/2023.    Past Medical History:  Diagnosis Date   Cancer of right breast (HCC) 1992   Hyperlipidemia    Hypertension    Postural hypotension 11/14/2018   Transient global amnesia 11/14/2018   Vitamin D  deficiency     Past Surgical History:  Procedure Laterality Date   ABDOMINAL HYSTERECTOMY     partial   BREAST LUMPECTOMY Right    CATARACT EXTRACTION, BILATERAL Bilateral 2016   TONSILLECTOMY AND ADENOIDECTOMY      Family History  Problem Relation Age of Onset   Heart attack Mother 74   Heart attack Father 40   Leukemia Brother    ALS Brother    Heart attack Brother    Cancer Maternal Aunt        NOS   Stomach cancer Maternal Grandmother        d. 2   Heart attack Maternal Grandfather    Leukemia Paternal Grandmother    Breast cancer Paternal Grandfather        d. 46   Breast cancer Niece 84   Leukemia Other 13       AML   Leukemia Other 13       d. 39    Social History   Socioeconomic History   Marital status: Married    Spouse name: Laverda Poster   Number of children: 2   Years of education: Not on file   Highest education level: Not on file  Occupational History   Not on file  Tobacco Use   Smoking status: Never    Passive exposure: Never    Smokeless tobacco: Never  Vaping Use   Vaping status: Never Used  Substance and Sexual Activity   Alcohol use: No   Drug use: No   Sexual activity: Not Currently  Other Topics Concern   Not on file  Social History Narrative   Not on file   Social Drivers of Health   Financial Resource Strain: Low Risk  (04/23/2019)   [facility]   Financial Resource Strain    In the past 12 months, have you experienced difficulty paying for basic needs like housing, utilities, food, transportation, or clothing: Not on file    Are there any financial concerns that limit you from seeing the doctor?: Not on file  Food Insecurity: Not on file  Transportation Needs: Low Risk  (04/23/2019)   [facility]   Transportation Needs    In the past 12 months, has lack of transportation kept you from medical appointments or from getting medications?: Not on file    In the past 12 months, has lack of transportation kept you from meetings, work, or getting things needed for daily living?: Not on file  Physical Activity: Not on file  Stress: Not on file  Social Connections: Low Risk  (04/23/2019)   [facility]   Social Connections    How often do you feel that you lack companionship?: Not on file    How often do you feel left out?: Not on file    How often to you feel isolated from others?: Not on file  Intimate Partner Violence: Not on file  Objective   BP 136/73 (BP Location: Right Arm, Patient Position: Sitting, Cuff Size: Normal)   Pulse 64   Ht 5\' 4"  (1.626 m)   Wt 124 lb 8 oz (56.5 kg)   SpO2 98%   BMI 21.37 kg/m   Physical Exam  83 year old female in no acute distress Cardiovascular exam with regular rate and rhythm Lungs clear to auscultation bilaterally  Assessment & Plan:   Primary hypertension Assessment & Plan: Blood pressure at goal, no concerns today, can continue  with current medication.  Recommend intermittent monitoring of blood pressure at home, DASH diet   Hyperlipidemia, mixed -     Atorvastatin  Calcium ; TAKE 1 TAB DAILY FOR CHOLESTEROL.  Dispense: 90 tablet; Refill: 3  Essential hypertension -     Metoprolol  Succinate ER; Take 1 tablet (25 mg total) by mouth daily.  Dispense: 90 tablet; Refill: 3  Vitamin D  deficiency Assessment & Plan: Can continue with current supplementation.  Will plan to monitor vitamin D  level at future visits   Stage 3a chronic kidney disease [facility]) Assessment & Plan: Can monitor with intermittent labs moving forward.  Will need to be mindful of this in conjunction with managing other medical conditions.  It will be important to ensure optimal blood pressure management.   Other orders -     Clopidogrel  Bisulfate; Take 1 tablet (75 mg total) by mouth daily.  Dispense: 90 tablet; Refill: 3  Return in about 6 months (around 05/02/2024).   Spent 48 minutes on this patient encounter, including preparation, chart review, face-to-face counseling with patient and coordination of care, and documentation of encounter   ___________________________________________ Jammy Stlouis de [place], MD, ABFM, [facility] Primary Care and Sports Medicine [facility]

## 2023-10-31 NOTE — Patient Instructions (Signed)

## 2023-11-02 ENCOUNTER — Inpatient Hospital Stay: Payer: HMO | Attending: Oncology

## 2023-11-02 ENCOUNTER — Telehealth: Payer: Self-pay | Admitting: *Deleted

## 2023-11-02 DIAGNOSIS — D75839 Thrombocytosis, unspecified: Secondary | ICD-10-CM | POA: Diagnosis not present

## 2023-11-02 DIAGNOSIS — D473 Essential (hemorrhagic) thrombocythemia: Secondary | ICD-10-CM | POA: Insufficient documentation

## 2023-11-02 LAB — CBC WITH DIFFERENTIAL (CANCER CENTER ONLY)
Abs Immature Granulocytes: 0.02 10*3/uL (ref 0.00–0.07)
Basophils Absolute: 0 10*3/uL (ref 0.0–0.1)
Basophils Relative: 0 %
Eosinophils Absolute: 0.2 10*3/uL (ref 0.0–0.5)
Eosinophils Relative: 2 %
HCT: 40.9 % (ref 36.0–46.0)
Hemoglobin: 13.5 g/dL (ref 12.0–15.0)
Immature Granulocytes: 0 %
Lymphocytes Relative: 29 %
Lymphs Abs: 2 10*3/uL (ref 0.7–4.0)
MCH: 35.7 pg — ABNORMAL HIGH (ref 26.0–34.0)
MCHC: 33 g/dL (ref 30.0–36.0)
MCV: 108.2 fL — ABNORMAL HIGH (ref 80.0–100.0)
Monocytes Absolute: 0.7 10*3/uL (ref 0.1–1.0)
Monocytes Relative: 11 %
Neutro Abs: 4 10*3/uL (ref 1.7–7.7)
Neutrophils Relative %: 58 %
Platelet Count: 369 10*3/uL (ref 150–400)
RBC: 3.78 MIL/uL — ABNORMAL LOW (ref 3.87–5.11)
RDW: 13.5 % (ref 11.5–15.5)
WBC Count: 7 10*3/uL (ref 4.0–10.5)
nRBC: 0 % (ref 0.0–0.2)

## 2023-11-02 LAB — CMP (CANCER CENTER ONLY)
ALT: 15 U/L (ref 0–44)
AST: 22 U/L (ref 15–41)
Albumin: 4.2 g/dL (ref 3.5–5.0)
Alkaline Phosphatase: 118 U/L (ref 38–126)
Anion gap: 9 (ref 5–15)
BUN: 17 mg/dL (ref 8–23)
CO2: 26 mmol/L (ref 22–32)
Calcium: 9.4 mg/dL (ref 8.9–10.3)
Chloride: 103 mmol/L (ref 98–111)
Creatinine: 0.92 mg/dL (ref 0.44–1.00)
GFR, Estimated: >60 mL/min (ref >60)
Glucose, Bld: 101 mg/dL — ABNORMAL HIGH (ref 70–99)
Potassium: 4.3 mmol/L (ref 3.5–5.1)
Sodium: 138 mmol/L (ref 135–145)
Total Bilirubin: 0.6 mg/dL (ref 0.0–1.2)
Total Protein: 6.4 g/dL — ABNORMAL LOW (ref 6.5–8.1)

## 2023-11-02 NOTE — Telephone Encounter (Signed)
 Called patient with stable labs results. Continue hydrea  at 1000 mg MWF and 500 mg all other days. She has been out a few days-waiting on [facility] to obtain drug (were out of stock). She will f/u as scheduled. Requested her appointment calendar be mailed to her.

## 2023-12-01 ENCOUNTER — Encounter: Payer: Medicare HMO | Admitting: Nurse Practitioner

## 2023-12-23 ENCOUNTER — Encounter (HOSPITAL_BASED_OUTPATIENT_CLINIC_OR_DEPARTMENT_OTHER): Payer: Self-pay | Admitting: Family Medicine

## 2023-12-23 ENCOUNTER — Ambulatory Visit (HOSPITAL_BASED_OUTPATIENT_CLINIC_OR_DEPARTMENT_OTHER): Admitting: Family Medicine

## 2023-12-23 VITALS — BP 112/71 | HR 65 | Ht 64.0 in | Wt 121.3 lb

## 2023-12-23 DIAGNOSIS — R0609 Other forms of dyspnea: Secondary | ICD-10-CM | POA: Diagnosis not present

## 2023-12-23 NOTE — Patient Instructions (Signed)
  Medication Instructions:  Your physician recommends that you continue on your current medications as directed. Please refer to the Current Medication list given to you today. --If you need a refill on any your medications before your next appointment, please call your pharmacy first. If no refills are authorized on file call the office.--   Referrals/Procedures/Imaging: Cardiology   Follow-Up: Your next appointment:   Your physician recommends that you schedule a follow-up appointment in: as needed with Dr. de [place]  You will receive a text message or e-mail with a link to a survey about your care and experience with us  today! We would greatly appreciate your feedback!   Thanks for letting us  be apart of your health journey!!  Primary Care and Sports Medicine   Dr. Quintin sheerer Peru   We encourage you to activate your patient portal called MyChart.  Sign up information is provided on this After Visit Summary.  MyChart is used to connect with patients for Virtual Visits (Telemedicine).  Patients are able to view lab/test results, encounter notes, upcoming appointments, etc.  Non-urgent messages can be sent to your provider as well. To learn more about what you can do with MyChart, please visit --  [email or web address].

## 2023-12-23 NOTE — Progress Notes (Unsigned)
    Procedures performed today:    None.  Independent interpretation of notes and tests performed by another provider:   None.  Brief History, Exam, Impression, and Recommendations:    BP 112/71 (BP Location: Left Arm, Patient Position: Sitting, Cuff Size: Normal)   Pulse 65   Ht 5' 4 (1.626 m)   Wt 121 lb 4.8 oz (55 kg)   SpO2 98%   BMI 20.82 kg/m   There are no diagnoses linked to this encounter.No follow-ups on file.   ___________________________________________ Tiffany Eggert de [place], MD, ABFM, [facility] Primary Care and Sports Medicine [facility]

## 2023-12-28 NOTE — Assessment & Plan Note (Signed)
 Patient has noted some issues with shortness of breath, particularly in relation to certain activities such as going up stairs.  This has been present for least a few months, she does think that she has had some progression with symptoms during this time.  Denies any issues with chest pain, lightheadedness, dizziness. On exam, patient is in no acute distress, vital signs stable.  Cardiovascular exam with regular rate and rhythm, no murmur appreciated.  Lungs clear to auscultation bilaterally. We discussed considerations and ultimately, patient would prefer to have evaluation with cardiology given ongoing, progressive symptoms.  Feel this is reasonable, can proceed with referral today.

## 2024-01-31 ENCOUNTER — Ambulatory Visit (HOSPITAL_BASED_OUTPATIENT_CLINIC_OR_DEPARTMENT_OTHER): Admitting: Family Medicine

## 2024-02-08 ENCOUNTER — Telehealth: Payer: Self-pay | Admitting: Oncology

## 2024-02-09 ENCOUNTER — Other Ambulatory Visit: Payer: HMO

## 2024-02-09 ENCOUNTER — Ambulatory Visit: Payer: HMO | Admitting: Oncology

## 2024-02-16 ENCOUNTER — Inpatient Hospital Stay (HOSPITAL_BASED_OUTPATIENT_CLINIC_OR_DEPARTMENT_OTHER): Admitting: Oncology

## 2024-02-16 ENCOUNTER — Inpatient Hospital Stay: Attending: Oncology

## 2024-02-16 VITALS — BP 103/71 | HR 64 | Temp 97.8°F | Resp 18 | Ht 64.0 in | Wt 121.1 lb

## 2024-02-16 DIAGNOSIS — D75839 Thrombocytosis, unspecified: Secondary | ICD-10-CM | POA: Insufficient documentation

## 2024-02-16 DIAGNOSIS — E538 Deficiency of other specified B group vitamins: Secondary | ICD-10-CM | POA: Diagnosis not present

## 2024-02-16 DIAGNOSIS — G454 Transient global amnesia: Secondary | ICD-10-CM | POA: Insufficient documentation

## 2024-02-16 DIAGNOSIS — E785 Hyperlipidemia, unspecified: Secondary | ICD-10-CM | POA: Insufficient documentation

## 2024-02-16 DIAGNOSIS — D473 Essential (hemorrhagic) thrombocythemia: Secondary | ICD-10-CM | POA: Insufficient documentation

## 2024-02-16 DIAGNOSIS — I10 Essential (primary) hypertension: Secondary | ICD-10-CM | POA: Insufficient documentation

## 2024-02-16 DIAGNOSIS — Z923 Personal history of irradiation: Secondary | ICD-10-CM | POA: Diagnosis not present

## 2024-02-16 DIAGNOSIS — Z853 Personal history of malignant neoplasm of breast: Secondary | ICD-10-CM | POA: Diagnosis not present

## 2024-02-16 DIAGNOSIS — Z809 Family history of malignant neoplasm, unspecified: Secondary | ICD-10-CM | POA: Diagnosis not present

## 2024-02-16 LAB — CBC WITH DIFFERENTIAL (CANCER CENTER ONLY)
Abs Immature Granulocytes: 0.02 K/uL (ref 0.00–0.07)
Basophils Absolute: 0 K/uL (ref 0.0–0.1)
Basophils Relative: 0 %
Eosinophils Absolute: 0.1 K/uL (ref 0.0–0.5)
Eosinophils Relative: 1 %
HCT: 40.6 % (ref 36.0–46.0)
Hemoglobin: 13.3 g/dL (ref 12.0–15.0)
Immature Granulocytes: 0 %
Lymphocytes Relative: 28 %
Lymphs Abs: 1.5 K/uL (ref 0.7–4.0)
MCH: 34.6 pg — ABNORMAL HIGH (ref 26.0–34.0)
MCHC: 32.8 g/dL (ref 30.0–36.0)
MCV: 105.7 fL — ABNORMAL HIGH (ref 80.0–100.0)
Monocytes Absolute: 0.6 K/uL (ref 0.1–1.0)
Monocytes Relative: 11 %
Neutro Abs: 3.1 K/uL (ref 1.7–7.7)
Neutrophils Relative %: 60 %
Platelet Count: 327 K/uL (ref 150–400)
RBC: 3.84 MIL/uL — ABNORMAL LOW (ref 3.87–5.11)
RDW: 15.3 % (ref 11.5–15.5)
WBC Count: 5.2 K/uL (ref 4.0–10.5)
nRBC: 0 % (ref 0.0–0.2)

## 2024-02-16 NOTE — Progress Notes (Signed)
  [facility] OFFICE PROGRESS NOTE   Diagnosis: Essential thrombocytosis  INTERVAL HISTORY:   Mr Hartner returns as scheduled.  She generally feels well.  Good appetite.  No bleeding or symptom of thrombosis.  She continues hydroxyurea .  She intermittently feels lightheaded .  No stroke symptoms.  She works in her yard.  Objective:  Vital signs in last 24 hours:  Blood pressure 103/71, pulse 64, temperature 97.8 F (36.6 C), temperature source Temporal, resp. rate 18, height 5' 4 (1.626 m), weight 121 lb 1.6 oz (54.9 kg), SpO2 98%.    HEENT: No thrush or ulcers Resp: Lungs clear bilaterally Cardio: Regular rate and rhythm GI: No hepatosplenomegaly Vascular: No leg edema  Lab Results:  Lab Results  Component Value Date   WBC 5.2 02/16/2024   HGB 13.3 02/16/2024   HCT 40.6 02/16/2024   MCV 105.7 (H) 02/16/2024   PLT 327 02/16/2024   NEUTROABS 3.1 02/16/2024    CMP  Lab Results  Component Value Date   NA 138 11/02/2023   K 4.3 11/02/2023   CL 103 11/02/2023   CO2 26 11/02/2023   GLUCOSE 101 (H) 11/02/2023   BUN 17 11/02/2023   CREATININE 0.92 11/02/2023   CALCIUM  9.4 11/02/2023   PROT 6.4 (L) 11/02/2023   ALBUMIN 4.2 11/02/2023   AST 22 11/02/2023   ALT 15 11/02/2023   ALKPHOS 118 11/02/2023   BILITOT 0.6 11/02/2023   GFRNONAA >60 11/02/2023   GFRAA 60 10/02/2020    No results found for: CEA1, CEA, CAN199, CA125  Lab Results  Component Value Date   INR 0.9 07/24/2021   LABPROT 12.3 07/24/2021    Imaging:  No results found.  Medications: I have reviewed the patient's current medications.   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 Hydroxyurea  started 09/04/2021 Hydroxyurea  increased to 500 mg Monday through Friday, 10/02/2021 Hydroxyurea  continued 500 mg Monday through Friday, 10/28/2021 Hydroxyurea  increased to 500 mg daily 11/25/2021 Hydroxyurea  increased to 1000 mg Monday, Wednesday, and  Friday, 500 mg other days 05/06/2023 Hemoglobin upper end of normal range Acute infarct left centrum semiovale 07/25/2021, currently on Plavix  and aspirin  for 3 weeks then Plavix  alone B12 deficiency, on oral replacement  Remote history of right breast cancer, status post radiation, completed 5 years of tamoxifen  History of transient global amnesia Hypertension Hyperlipidemia Multiple family members with malignancy     Disposition: Ms. Riesen appears stable.  The platelet count is in goal range.  She will continue hydroxyurea  at the current dose.  She will return for a lab visit in 3 months and an office visit in 6 months.  Her blood pressure is low, but stable from past readings.  She will follow-up with her primary provider if the lightheaded feeling persists.  Arley Hof, MD  02/16/2024  10:49 AM

## 2024-02-17 ENCOUNTER — Other Ambulatory Visit: Payer: Self-pay | Admitting: *Deleted

## 2024-02-17 DIAGNOSIS — D75839 Thrombocytosis, unspecified: Secondary | ICD-10-CM

## 2024-02-17 MED ORDER — HYDROXYUREA 500 MG PO CAPS: ORAL_CAPSULE | ORAL | 1 refills | Status: DC

## 2024-02-17 NOTE — Telephone Encounter (Signed)
 MD requesting RN send in refill for #100 day supply on Hydrea -same dose

## 2024-03-13 ENCOUNTER — Ambulatory Visit: Payer: Medicare HMO | Admitting: Nurse Practitioner

## 2024-05-02 ENCOUNTER — Ambulatory Visit (INDEPENDENT_AMBULATORY_CARE_PROVIDER_SITE_OTHER): Admitting: Family Medicine

## 2024-05-02 ENCOUNTER — Encounter (HOSPITAL_BASED_OUTPATIENT_CLINIC_OR_DEPARTMENT_OTHER): Payer: Self-pay | Admitting: Family Medicine

## 2024-05-02 VITALS — BP 108/71 | HR 63 | Temp 97.4°F | Resp 18 | Ht 64.0 in | Wt 122.0 lb

## 2024-05-02 DIAGNOSIS — Z23 Encounter for immunization: Secondary | ICD-10-CM | POA: Diagnosis not present

## 2024-05-02 DIAGNOSIS — E538 Deficiency of other specified B group vitamins: Secondary | ICD-10-CM | POA: Insufficient documentation

## 2024-05-02 DIAGNOSIS — E559 Vitamin D deficiency, unspecified: Secondary | ICD-10-CM

## 2024-05-02 DIAGNOSIS — E782 Mixed hyperlipidemia: Secondary | ICD-10-CM

## 2024-05-02 DIAGNOSIS — I10 Essential (primary) hypertension: Secondary | ICD-10-CM | POA: Diagnosis not present

## 2024-05-02 DIAGNOSIS — R7303 Prediabetes: Secondary | ICD-10-CM

## 2024-05-02 MED ORDER — DESONIDE 0.05 % EX CREA: TOPICAL_CREAM | Freq: Two times a day (BID) | CUTANEOUS | 3 refills | Status: AC

## 2024-05-02 MED ORDER — DESONIDE 0.05 % EX CREA: TOPICAL_CREAM | Freq: Two times a day (BID) | CUTANEOUS | 3 refills | Status: DC

## 2024-05-02 NOTE — Progress Notes (Unsigned)
    Procedures performed today:    None.  Independent interpretation of notes and tests performed by another provider:   None.  Brief History, Exam, Impression, and Recommendations:    BP 108/71 (BP Location: Left Arm, Patient Position: Sitting, Cuff Size: Normal)   Pulse 63   Temp (!) 97.4 F (36.3 C) (Oral)   Resp 18   Ht 5' 4 (1.626 m)   Wt 122 lb (55.3 kg)   SpO2 98%   BMI 20.94 kg/m   Primary hypertension Assessment & Plan: Blood pressure at goal, no concerns today, can continue with current medication.  Recommend intermittent monitoring of blood pressure at home, DASH diet  Orders: -     Lipid panel; Future  Immunization due -     Flu vaccine HIGH DOSE PF(Fluzone Trivalent)  Mixed hyperlipidemia Assessment & Plan: Patient continues to do well, she continues with atorvastatin , denies any issues with medication. We can plan to check cholesterol panel with next labs for monitoring.  Orders: -     Lipid panel; Future  Vitamin D  deficiency Assessment & Plan: Can continue with current supplementation.  Will plan to check vitamin D  level with next labs  Orders: -     VITAMIN D  25 Hydroxy (Vit-D Deficiency, Fractures); Future  Vitamin B12 deficiency Assessment & Plan: We can check B12 level with next labs for monitoring to ensure no current B12 deficiency  Orders: -     Vitamin B12; Future  Prediabetes Assessment & Plan: Prior labs with A1c stable within prediabetes range, last checked showed A1c at 6.2%.  We can plan to check A1c with next labs for monitoring.  Orders: -     Hemoglobin A1c; Future  Other orders -     Desonide ; Apply topically 2 (two) times daily.  Dispense: 120 g; Refill: 3  Return in about 6 months (around 10/30/2024) for hypertension, hyperlipidemia, fasting labs 1 week prior.   ___________________________________________ Doreatha Offer de [place], MD, ABFM, [facility] Primary Care and Sports Medicine [facility]

## 2024-05-03 DIAGNOSIS — R7303 Prediabetes: Secondary | ICD-10-CM | POA: Insufficient documentation

## 2024-05-03 NOTE — Assessment & Plan Note (Signed)
 Can continue with current supplementation.  Will plan to check vitamin D  level with next labs

## 2024-05-03 NOTE — Assessment & Plan Note (Signed)
 We can check B12 level with next labs for monitoring to ensure no current B12 deficiency

## 2024-05-03 NOTE — Assessment & Plan Note (Signed)
 Patient continues to do well, she continues with atorvastatin , denies any issues with medication. We can plan to check cholesterol panel with next labs for monitoring.

## 2024-05-03 NOTE — Assessment & Plan Note (Signed)
 Blood pressure at goal, no concerns today, can continue with current medication.  Recommend intermittent monitoring of blood pressure at home, DASH diet

## 2024-05-03 NOTE — Assessment & Plan Note (Signed)
 Prior labs with A1c stable within prediabetes range, last checked showed A1c at 6.2%.  We can plan to check A1c with next labs for monitoring.

## 2024-05-11 ENCOUNTER — Telehealth: Payer: Self-pay | Admitting: Oncology

## 2024-05-11 NOTE — Telephone Encounter (Signed)
 PT called to confirm appts

## 2024-05-18 ENCOUNTER — Inpatient Hospital Stay: Attending: Oncology

## 2024-05-18 DIAGNOSIS — D75839 Thrombocytosis, unspecified: Secondary | ICD-10-CM

## 2024-05-18 LAB — CBC WITH DIFFERENTIAL (CANCER CENTER ONLY)
Abs Immature Granulocytes: 0.03 K/uL (ref 0.00–0.07)
Basophils Absolute: 0 K/uL (ref 0.0–0.1)
Basophils Relative: 0 %
Eosinophils Absolute: 0.1 K/uL (ref 0.0–0.5)
Eosinophils Relative: 2 %
HCT: 39.2 % (ref 36.0–46.0)
Hemoglobin: 13.2 g/dL (ref 12.0–15.0)
Immature Granulocytes: 1 %
Lymphocytes Relative: 25 %
Lymphs Abs: 1.6 K/uL (ref 0.7–4.0)
MCH: 36.2 pg — ABNORMAL HIGH (ref 26.0–34.0)
MCHC: 33.7 g/dL (ref 30.0–36.0)
MCV: 107.4 fL — ABNORMAL HIGH (ref 80.0–100.0)
Monocytes Absolute: 0.6 K/uL (ref 0.1–1.0)
Monocytes Relative: 9 %
Neutro Abs: 4 K/uL (ref 1.7–7.7)
Neutrophils Relative %: 63 %
Platelet Count: 372 K/uL (ref 150–400)
RBC: 3.65 MIL/uL — ABNORMAL LOW (ref 3.87–5.11)
RDW: 13.6 % (ref 11.5–15.5)
WBC Count: 6.3 K/uL (ref 4.0–10.5)
nRBC: 0 % (ref 0.0–0.2)

## 2024-05-20 ENCOUNTER — Ambulatory Visit: Payer: Self-pay | Admitting: Oncology

## 2024-05-22 ENCOUNTER — Ambulatory Visit (HOSPITAL_BASED_OUTPATIENT_CLINIC_OR_DEPARTMENT_OTHER)

## 2024-05-22 NOTE — Telephone Encounter (Signed)
 Notified patient via VM that platelet count is in range and to continue same Hydrea  dose. F/U as scheduled.

## 2024-06-30 ENCOUNTER — Other Ambulatory Visit: Payer: Self-pay | Admitting: Oncology

## 2024-06-30 DIAGNOSIS — D75839 Thrombocytosis, unspecified: Secondary | ICD-10-CM

## 2024-08-16 ENCOUNTER — Other Ambulatory Visit

## 2024-08-16 ENCOUNTER — Ambulatory Visit: Admitting: Oncology

## 2024-10-22 ENCOUNTER — Ambulatory Visit (HOSPITAL_BASED_OUTPATIENT_CLINIC_OR_DEPARTMENT_OTHER)

## 2024-10-30 ENCOUNTER — Ambulatory Visit (HOSPITAL_BASED_OUTPATIENT_CLINIC_OR_DEPARTMENT_OTHER): Admitting: Family Medicine
